# Patient Record
Sex: Female | Born: 1960 | Race: White | Hispanic: No | Marital: Married | Smoking: Former smoker
Health system: Southern US, Community
[De-identification: ages and names within clinical notes are randomized; demographics above are authoritative.]

## PROBLEM LIST (undated history)

## (undated) DIAGNOSIS — K5900 Constipation, unspecified: Secondary | ICD-10-CM

## (undated) DIAGNOSIS — T7840XA Allergy, unspecified, initial encounter: Secondary | ICD-10-CM

## (undated) DIAGNOSIS — M549 Dorsalgia, unspecified: Secondary | ICD-10-CM

## (undated) DIAGNOSIS — Z8616 Personal history of COVID-19: Secondary | ICD-10-CM

## (undated) DIAGNOSIS — L409 Psoriasis, unspecified: Secondary | ICD-10-CM

## (undated) DIAGNOSIS — D689 Coagulation defect, unspecified: Secondary | ICD-10-CM

## (undated) DIAGNOSIS — E785 Hyperlipidemia, unspecified: Secondary | ICD-10-CM

## (undated) DIAGNOSIS — E079 Disorder of thyroid, unspecified: Secondary | ICD-10-CM

## (undated) DIAGNOSIS — M1990 Unspecified osteoarthritis, unspecified site: Secondary | ICD-10-CM

## (undated) DIAGNOSIS — F32A Depression, unspecified: Secondary | ICD-10-CM

## (undated) DIAGNOSIS — I10 Essential (primary) hypertension: Secondary | ICD-10-CM

## (undated) DIAGNOSIS — N6099 Unspecified benign mammary dysplasia of unspecified breast: Secondary | ICD-10-CM

## (undated) DIAGNOSIS — K219 Gastro-esophageal reflux disease without esophagitis: Secondary | ICD-10-CM

## (undated) DIAGNOSIS — E119 Type 2 diabetes mellitus without complications: Secondary | ICD-10-CM

## (undated) DIAGNOSIS — F329 Major depressive disorder, single episode, unspecified: Secondary | ICD-10-CM

## (undated) DIAGNOSIS — G4700 Insomnia, unspecified: Secondary | ICD-10-CM

## (undated) DIAGNOSIS — I82401 Acute embolism and thrombosis of unspecified deep veins of right lower extremity: Secondary | ICD-10-CM

## (undated) HISTORY — DX: Gastro-esophageal reflux disease without esophagitis: K21.9

## (undated) HISTORY — DX: Psoriasis, unspecified: L40.9

## (undated) HISTORY — DX: Unspecified benign mammary dysplasia of unspecified breast: N60.99

## (undated) HISTORY — DX: Depression, unspecified: F32.A

## (undated) HISTORY — DX: Insomnia, unspecified: G47.00

## (undated) HISTORY — DX: Type 2 diabetes mellitus without complications: E11.9

## (undated) HISTORY — DX: Personal history of COVID-19: Z86.16

## (undated) HISTORY — DX: Disorder of thyroid, unspecified: E07.9

## (undated) HISTORY — DX: Major depressive disorder, single episode, unspecified: F32.9

## (undated) HISTORY — DX: Allergy, unspecified, initial encounter: T78.40XA

## (undated) HISTORY — DX: Coagulation defect, unspecified: D68.9

## (undated) HISTORY — DX: Essential (primary) hypertension: I10

## (undated) HISTORY — DX: Hyperlipidemia, unspecified: E78.5

## (undated) HISTORY — DX: Constipation, unspecified: K59.00

## (undated) HISTORY — DX: Unspecified osteoarthritis, unspecified site: M19.90

---

## 1995-08-21 HISTORY — PX: HAMMER TOE SURGERY: SHX385

## 1998-08-20 DIAGNOSIS — I82401 Acute embolism and thrombosis of unspecified deep veins of right lower extremity: Secondary | ICD-10-CM

## 1998-08-20 HISTORY — PX: KNEE ARTHROSCOPY: SHX127

## 1998-08-20 HISTORY — DX: Acute embolism and thrombosis of unspecified deep veins of right lower extremity: I82.401

## 1999-08-21 HISTORY — PX: CHOLECYSTECTOMY: SHX55

## 1999-08-21 HISTORY — PX: FRACTURE SURGERY: SHX138

## 2000-04-08 ENCOUNTER — Emergency Department (HOSPITAL_COMMUNITY): Admission: EM | Admit: 2000-04-08 | Discharge: 2000-04-08 | Payer: Self-pay | Admitting: Emergency Medicine

## 2000-04-08 ENCOUNTER — Encounter: Payer: Self-pay | Admitting: Emergency Medicine

## 2000-04-10 ENCOUNTER — Inpatient Hospital Stay (HOSPITAL_COMMUNITY): Admission: RE | Admit: 2000-04-10 | Discharge: 2000-04-13 | Payer: Self-pay | Admitting: Orthopedic Surgery

## 2000-04-10 ENCOUNTER — Encounter: Payer: Self-pay | Admitting: Orthopedic Surgery

## 2000-05-14 ENCOUNTER — Encounter: Payer: Self-pay | Admitting: Orthopedic Surgery

## 2000-05-14 ENCOUNTER — Encounter: Admission: RE | Admit: 2000-05-14 | Discharge: 2000-05-14 | Payer: Self-pay | Admitting: Orthopedic Surgery

## 2000-06-07 ENCOUNTER — Inpatient Hospital Stay (HOSPITAL_COMMUNITY): Admission: EM | Admit: 2000-06-07 | Discharge: 2000-06-18 | Payer: Self-pay | Admitting: Emergency Medicine

## 2000-06-14 ENCOUNTER — Encounter: Payer: Self-pay | Admitting: Orthopedic Surgery

## 2000-07-08 ENCOUNTER — Encounter: Admission: RE | Admit: 2000-07-08 | Discharge: 2000-09-19 | Payer: Self-pay | Admitting: Orthopedic Surgery

## 2000-08-27 ENCOUNTER — Ambulatory Visit (HOSPITAL_COMMUNITY): Admission: RE | Admit: 2000-08-27 | Discharge: 2000-08-27 | Payer: Self-pay | Admitting: Family Medicine

## 2000-11-18 ENCOUNTER — Other Ambulatory Visit: Admission: RE | Admit: 2000-11-18 | Discharge: 2000-11-18 | Payer: Self-pay | Admitting: Obstetrics and Gynecology

## 2000-11-18 ENCOUNTER — Encounter (INDEPENDENT_AMBULATORY_CARE_PROVIDER_SITE_OTHER): Payer: Self-pay | Admitting: Specialist

## 2001-03-06 ENCOUNTER — Observation Stay (HOSPITAL_COMMUNITY): Admission: RE | Admit: 2001-03-06 | Discharge: 2001-03-07 | Payer: Self-pay | Admitting: General Surgery

## 2001-03-06 ENCOUNTER — Encounter (INDEPENDENT_AMBULATORY_CARE_PROVIDER_SITE_OTHER): Payer: Self-pay | Admitting: *Deleted

## 2001-08-20 HISTORY — PX: HERNIA REPAIR: SHX51

## 2002-09-02 ENCOUNTER — Other Ambulatory Visit: Admission: RE | Admit: 2002-09-02 | Discharge: 2002-09-02 | Payer: Self-pay | Admitting: Obstetrics and Gynecology

## 2003-06-17 LAB — HM PAP SMEAR: HM Pap smear: NORMAL

## 2003-10-29 ENCOUNTER — Other Ambulatory Visit: Admission: RE | Admit: 2003-10-29 | Discharge: 2003-10-29 | Payer: Self-pay | Admitting: Obstetrics and Gynecology

## 2004-08-20 HISTORY — PX: COLONOSCOPY: SHX174

## 2004-09-16 LAB — HM COLONOSCOPY: HM Colonoscopy: NORMAL

## 2004-12-13 ENCOUNTER — Other Ambulatory Visit: Admission: RE | Admit: 2004-12-13 | Discharge: 2004-12-13 | Payer: Self-pay | Admitting: Obstetrics and Gynecology

## 2005-05-02 ENCOUNTER — Encounter: Admission: RE | Admit: 2005-05-02 | Discharge: 2005-07-31 | Payer: Self-pay | Admitting: Internal Medicine

## 2005-05-25 ENCOUNTER — Emergency Department (HOSPITAL_COMMUNITY): Admission: EM | Admit: 2005-05-25 | Discharge: 2005-05-25 | Payer: Self-pay | Admitting: Emergency Medicine

## 2005-06-20 ENCOUNTER — Ambulatory Visit: Payer: Self-pay | Admitting: Internal Medicine

## 2005-06-21 ENCOUNTER — Ambulatory Visit: Payer: Self-pay | Admitting: Internal Medicine

## 2005-06-21 ENCOUNTER — Encounter (INDEPENDENT_AMBULATORY_CARE_PROVIDER_SITE_OTHER): Payer: Self-pay | Admitting: Specialist

## 2005-08-08 ENCOUNTER — Ambulatory Visit: Payer: Self-pay | Admitting: Internal Medicine

## 2005-08-15 ENCOUNTER — Ambulatory Visit: Payer: Self-pay | Admitting: Internal Medicine

## 2006-03-20 ENCOUNTER — Other Ambulatory Visit: Admission: RE | Admit: 2006-03-20 | Discharge: 2006-03-20 | Payer: Self-pay | Admitting: Obstetrics and Gynecology

## 2006-06-20 ENCOUNTER — Ambulatory Visit (HOSPITAL_COMMUNITY): Admission: RE | Admit: 2006-06-20 | Discharge: 2006-06-20 | Payer: Self-pay | Admitting: Internal Medicine

## 2006-12-19 ENCOUNTER — Ambulatory Visit (HOSPITAL_COMMUNITY): Admission: RE | Admit: 2006-12-19 | Discharge: 2006-12-19 | Payer: Self-pay | Admitting: Internal Medicine

## 2008-02-04 ENCOUNTER — Ambulatory Visit: Payer: Self-pay | Admitting: Vascular Surgery

## 2009-05-18 ENCOUNTER — Encounter: Admission: RE | Admit: 2009-05-18 | Discharge: 2009-05-18 | Payer: Self-pay | Admitting: Internal Medicine

## 2009-06-01 ENCOUNTER — Ambulatory Visit (HOSPITAL_COMMUNITY): Admission: RE | Admit: 2009-06-01 | Discharge: 2009-06-01 | Payer: Self-pay | Admitting: Internal Medicine

## 2010-06-14 ENCOUNTER — Ambulatory Visit (HOSPITAL_COMMUNITY): Admission: RE | Admit: 2010-06-14 | Discharge: 2010-06-14 | Payer: Self-pay | Admitting: Orthopaedic Surgery

## 2011-01-02 NOTE — Procedures (Signed)
LOWER EXTREMITY VENOUS REFLUX EXAM   INDICATION:  Right leg pain and swelling.   EXAM:  Using color-flow imaging and pulse Doppler spectral analysis, the  right common femoral, superficial femoral, popliteal, posterior tibial,  greater and lesser saphenous veins are evaluated.  There is no evidence  suggesting deep venous insufficiency in the right lower extremity.   The right saphenofemoral junction is competent.  The right GSV is  competent with the caliber as described below.   The right proximal short saphenous vein demonstrates competency.   GSV Diameter (used if found to be incompetent only)                                            Right    Left  Proximal Greater Saphenous Vein           cm       cm  Proximal-to-mid-thigh                     cm       cm  Mid thigh                                 cm       cm  Mid-distal thigh                          cm       cm  Distal thigh                              cm       cm  Knee                                      cm       cm    IMPRESSION:  1. Right greater saphenous vein had no reflux.  2. The right greater saphenous vein is not aneurysmal.  3. The right greater saphenous vein is not tortuous.  4. The deep venous system is competent.  5. The right lesser saphenous vein is competent.  6. There is a perforator noted distal to the calf with a measurement      of 0.63 cm.  7. No evidence of deep venous thrombosis noted in the right leg.     ___________________________________________  Larina Earthly, M.D.   MG/MEDQ  D:  02/04/2008  T:  02/04/2008  Job:  [id number]

## 2011-01-02 NOTE — Letter (Signed)
February 04, 2008   Lovenia Kim, D.O.  [address]  [place]  [place] [address]   Re:  EMBER, HENRIKSON                  DOB:  26-Jun-1961   Dear Amy:   Thank you for asking me to see your patient for evaluation of her right  leg venous pathology.  As you know, she is an otherwise healthy 50-year-  old white female with discomfort and progressive bulging in her right  medial calf above her medial malleolus.  She does have a history of deep  venous thrombosis in 2001 in her right popliteal vein at the time of  knee arthroscopic surgery.  She was on Coumadin for several months  treatment and had good resolution.  She has had no post phlebitic  syndrome.  She reports that she does have some aching over this area  that protrudes at the end of a long day.  She has not worn any graduated  compression garments.   PAST MEDICAL HISTORY:  Otherwise unremarkable.  She does have history of  hypothyroidism and is on Synthroid replacement.  She denies any cardiac  history and denies any elevated cholesterol or hypertension or diabetes.   SOCIAL HISTORY:  She is single.  She does smoke one pack of cigarettes  per day.  Does not drink alcohol on a regular basis.   REVIEW OF SYSTEMS:  Her weight is reportedly 210 pounds.  She is 5 feet  5 inches tall.  Otherwise her cardiac, pulmonary, GI, GU review of  systems is negative.   PHYSICAL EXAMINATION:  General:  She is a well-developed, well-nourished  white female appearing her stated age of 28.  Vital signs:  Blood  pressure is 133/90, pulse 75, respirations 18.  She has 2+ dorsalis  pedis pulses bilaterally.  She does have prominent varicosity at the  level of her medial distal right calf.  She does have some scattered  spider vein telangiectasia bilaterally but otherwise no evidence of  venous hypertension.   She underwent a right leg venous duplex in our office.  Fortunately this  shows that she has had complete resolution of her  slight deep venous  thrombosis with no evidence of occlusion or reflux in her deep system.  Her right great saphenous vein is somewhat large in caliber but she does  not have any reflux in her superficial veins as well.  She does have a  perforator connecting her deep and superficial system with reflux over  the area of the bulge in her medial calf.   I discussed this at length with the patient.  I explained that this does  not preclude her to any more serious complications and I am quite  pleased at her resolution of her prior DVT from 2001.  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  I explained  this could be treated in our office with either sclerotherapy or laser  ablation of the perforator.  I explained that our choice would be laser  ablation for better longterm durability.  She understands the discussion  and is currently comfortable with her level of discomfort and is  comfortable with observation only.  I did explain the importance of  elevation when possible.  Also she understands the potential treatment  of graduated compression garments as well.  She will notify me should  she wish to proceed with further treatment.  Once again, thank you for allowing me to participate in the care of this  patient with you.   Sincerely,   Larina Earthly, M.D.  Electronically Signed   TFE/MEDQ  D:  02/04/2008  T:  02/05/2008  Job:  [id number]

## 2011-01-05 NOTE — Discharge Summary (Signed)
[facility]  Patient:    Christine Norman, Christine Norman                          MRN: [id number] Adm. Date:  [id number] Disc. Date: [id number] Attending:  Loanne Drilling Dictator:   Ralene Bathe, P.A.C.                           Discharge Summary  ADMISSION DIAGNOSES: 1. Displaced fibular fracture on the right. 2. History of deep venous thrombosis.  DISCHARGE DIAGNOSES: 1. Displaced fibular fracture on the right. 2. History of deep venous thrombosis. 3. Status post open reduction and internal fixation right fibula.  OPERATION:  ORIF right fibula.  SURGEON:  Trudee Grip, M.D.  ASSISTANT:  Della Goo, P.A.  ANESTHESIA:  General anesthesia.  BRIEF HISTORY:  This 50 year old female slipped and fell sustaining a right ankle injury.  X-rays demonstrated displaced distal fibular fracture. Displacement was noted and operative fixation was indicated.  Risks and benefits were discussed with the patient and she was in agreement with the procedure.  HOSPITAL COURSE:  The patient was admitted and underwent the above noted procedure and tolerated this well.  All appropriate IV antibiotics and analgesics were provided.  Postoperatively she was placed in a splint, nonweightbearing to the operative extremity.  The patient had a previous history of DVT, therefore Coumadin and Lovenox were used postoperatively.  She was kept hospitalized until therapeutic on her Coumadin.  The patient was weaned off of IV antibiotics and physical therapy worked with the patient. All home needs were arranged.  On April 13, 2000, she was on p.o. analgesics. She was therapeutic on her Coumadin with protime of 19.2 and INR of 2.1.  She was neurovascularly intact to the operative extremity and she was stable for discharge to home.  LABORATORY VALUES:  Admission hemoglobin normal at 11.4.  Postoperative protimes all followed by pharmacy and she was therapeutic with protime of 19.2 and  INR 2.1 prior to discharge.  Routine chemistries on admission were within normal limits.  Routine urinalysis was normal.  X-rays showed postoperative status post ORIF distal fibula fracture on the right in anatomic alignment.  Chest x-ray showed cardiac silhouette upper limits of normal.  EKG on admission showed normal sinus rhythm.  CONDITION ON DISCHARGE:  Stable ___________________.  DISPOSITION:  The patient discharged to home in the care of family. Wheelchair and walker for home needs.  Continue nonweightbearing.  Continue ice and elevation.  Protimes will be monitored per pharmacy as well as Coumadin protocol.  DISCHARGE MEDICATIONS: 1. Percocet 7.5 mg #40 one to two every four to six hours p.r.n. pain. 2. Robaxin 500 mg #30 one every six hours p.r.n. spasm. 3. Celebrex, which she has at home.  DISCHARGE INSTRUCTIONS:  Keep splint dry.  FOLLOW-UP:  She will follow up in our office on April 19, 2000, and is to call for a time. DD:  05/20/00 TD:  05/20/00 Job: [id number]

## 2011-01-05 NOTE — Discharge Summary (Signed)
[facility]  Patient:    Christine Norman, Christine Norman                          MRN: [id number] Adm. Date:  [id number] Disc. Date: [id number] Attending:  Loanne Drilling Dictator:   Della Goo, P.A.                           Discharge Summary  ADMITTING DIAGNOSES: 1. Deep venous thrombosis, right lower extremity. 2. Status post right knee arthroscopy June 04, 2000. 3. Status post open reduction internal fixation right ankle April 10, 2000. 4. History of previous deep venous thrombosis following foot surgery    approximately 3 years prior to this admission with use of chronic Coumadin    since that time.  DISCHARGE DIAGNOSES: 1. Deep venous thrombosis, right lower extremity. 2. Status post right knee arthroscopy June 04, 2000. 3. Status post open reduction internal fixation right ankle April 10, 2000. 4. History of previous deep venous thrombosis following foot surgery    approximately 3 years prior to this admission with use of chronic Coumadin    since that time. 5. Mild iron deficiency anemia.  PROCEDURE:  None.  CONSULTATIONS:  Dr. Jamie Brookes.  BRIEF HISTORY:  The patient is a 50 year old female who developed severe unrelenting pain in her right calf shortly following a right knee arthroscopy. On examination, the calf was noted to be tender with a positive Homans. She was sent to [facility] where Dopplers revealed deep venous thrombosis of the right lower extremity. She was admitted to the hospital for treatment of the thrombus.  BRIEF HOSPITAL COURSE:  Upon admission, the patient was seen by pharmacy for anticoagulation therapy. She was placed on Lovenox subcu and continued on her Coumadin. Daily pro-times were drawn and she did reach therapeutic level. She continued to have significant pain over several days and required oral analgesics around the clock. She was at bedrest with the leg elevated and eventually was able to begin  some ambulation with crutches when her pain began to resolve. A repeat Doppler on October 23 showed a continuation of the clot. The patient was seen by Dr. Jamie Brookes during the hospital stay. She did have mild iron deficiency anemia and was started on iron supplementation. Also during the hospital stay, she worked on smoking cessation. Repeat Doppler on October 29 showed diminishing size of the right DVT. She was therapeutic with her anticoagulation therapy and arrangements were made for her to have follow-up Coumadin management at [facility] with her usual family medical doctor.  CONDITION ON DISCHARGE:  Stable.  PERTINENT LABORATORY VALUES:  Admission CBC showed wbc 13.6, hemoglobin 11.6, hematocrit 34.2. Coagulation studies on admission showed protime 15.7, INR 1.4. She continued to show signs of elevation in PT and INR and did reach therapeutic range while on Coumadin and Lovenox. Factor 5 mutation and prothrombin gene mutation were pending at the time of discharge. Her ferritin level was noted to be 54.  X-rays taken during the hospital stay include two views of the right ankle on June 14, 2000 showing a comminuted fibular fracture status post ORIF with hardware intact. No EKG was necessary during the hospital stay. Doppler studies as stated above.  PLAN:  The patient is being discharged to her home. She will continue to ambulate as tolerated and have range of motion of the right knee as tolerated. Her wounds  from the previous knee arthroscopy are dry and clean and there are no specific treatments for these. Stitches were removed during the hospital stay. She will have the right lower extremity elevated when not ambulatory.  DISCHARGE MEDICATIONS: 1. Percocet #30, 1-2 every 4-6h as needed for pain. 2. Robaxin #20, 1 every 8h as needed for spasm. 3. She will be sent home with a prescription for Coumadin and will have    follow-up with [facility]  regarding her Coumadin management.  FOLLOW-UP:  With Dr. Lequita Halt in 1 week and has been advised to call the office to arrange the appointment. If there are further questions or concerns prior to her return office visit, she has been advised to call the office. DD:  06/18/00 TD:  06/18/00 Job: [id number]

## 2011-01-05 NOTE — Op Note (Signed)
[facility]  Patient:    Christine Norman, Christine Norman                          MRN: [id number] Proc. Date: 03/06/01 Adm. Date:  [id number] Attending:  Carson Myrtle CC:         Dario Guardian, M.D.   Operative Report  PREOPERATIVE DIAGNOSIS:  Cholecystolithiasis.  POSTOPERATIVE DIAGNOSIS:  Cholecystolithiasis.  OPERATION:  Laparoscopic cholecystectomy.  SURGEON:  Timothy E. Earlene Plater, M.D.  ASSISTANT:  Anselm Pancoast. Zachery Dakins, M.D.  ANESTHESIA:  CRNA supervised by Dr. Shireen Quan.  INDICATIONS:  Ms. [name] is otherwise healthy, 51, obese, on a weight-losing diet, history of DVTs, following orthopedic surgery, who has developed in the past few months, following a diet, acute episodes of cholecystolithiasis. Laboratory data are normal.  Ultrasound confirms small stones.  The patient is ready to proceed.  She is given preoperative antibiotics, preoperative heparin subcu, and PAS hose.  DESCRIPTION OF PROCEDURE:  She was taken to the operating room and placed supine.  General endotracheal anesthesia administered.  The abdomen was scrubbed, prepped and draped in the usual fashion.  An infraumbilical incision made, the fascia identified and opened vertically, and the peritoneum entered without complication.  A #1 Vicryl suture placed, Hasson catheter through the fascia into the peritoneum and tied in place.  The abdomen was insufflated, laparoscope inserted, no other findings made.  Gallbladder thickened, minimal adhesions.  A 10 mm trocar was placed in the mid epigastrium, two 5 mm trocars in the right upper quadrant.  The gallbladder was grasped and placed on tension.  Careful dissection at the base of the gallbladder revealed a single cystic duct entering the gallbladder.  The triangle of Calot was completely dissected down including an artery which was posterior to the cystic duct. These structures well demonstrated. The cystic duct was triply clipped and divided.   The artery was further dissected, triply clipped, and divided. Further dissection with the cautery released the gallbladder from the gallbladder bed.  A small posterior tributary artery was clipped once.  The gallbladder was removed from the gallbladder bed without incident or complication.  There was no bleeding, no bile leak.  The gallbladder was removed through the gallbladder through the infraumbilical incision which was then tied closed with the #1 Vicryl.  Copious irrigation was carried out. inspection was negative.  With this, the irrigant, CO2, instruments, and trocars all removed from the abdominal cavity.  Each site had been injected with 0.5% Marcaine with epinephrine prior to incision.  Each of these was now closed with the 3-0 Monocryl.  Steri-Strips applied.  Dry sterile dressings applied.  Second counts correct.  She tolerated it well and was removed to the recovery room in good condition. DD:  03/06/01 TD:  03/06/01 Job: [id number]

## 2011-01-05 NOTE — Op Note (Signed)
[facility]  Patient:    Christine Norman, Christine Norman                          MRN: [id number] Proc. Date: 04/10/00 Adm. Date:  [id number] Attending:  Ollen Gross V                           Operative Report  PREOPERATIVE DIAGNOSIS:  Right fibular fracture.  POSTOPERATIVE DIAGNOSIS:  Right fibular fracture.  PROCEDURE:  Open reduction, internal fixation, right fibular fracture.  SURGEON:  Trudee Grip, M.D.  ASSISTANT:  Della Goo, P.A.  ANESTHESIA:   General.  ESTIMATED BLOOD LOSS:  Minimal.  DRAINS:  None.  TOURNIQUET TIME:  20 minutes at 350 mmHg.  COMPLICATIONS:  None.  CONDITION:  Stable to the recovery room.  INDICATIONS:  Christine Norman is a 50 year old female who had a fall two nights ago, sustaining a displaced right fibular fracture.  It is about 8.0 cm above the joint, but is somewhat rotated and not in a position amenable to healing.  She is hurting all the way down through the ankle joint and also has some medial soreness.  She presents now for an open reduction and internal fixation.  DESCRIPTION OF PROCEDURE:  After the successful administration of general anesthetic, a tourniquet was placed high on the right thigh, and the right lower extremity prepped and draped in the usual sterile fashion.  The extremity is wrapped in an Esmarch and the tourniquet is inflated to 350 mmHg. The fracture is palpated through the skin and an incision made about 8.0 cm in length with the center of the incision centered over the fracture.  The skin is cut with a #10 blade.  The subcutaneous tissue is carefully dissected, to avoid the superficial peroneal nerve.  Muscle is identified just posterior to the fibula and then the fracture was identified.  The periosteum is elevated at the fracture level.  The fracture is anatomically reduced, but there is a small butterfly posteriorly that is left attached to its muscular attachments. Once the fracture is  anatomically reduced, then a six-hole, one-third tubular plate is placed in the center, plated at the apex of the fracture.  Three cortical screws are placed proximal, three distal to the fracture line.  This lead to an anatomic reduction, confirmed on multiple fluoroscopic views.  At this point the wound is copiously irrigated and the deep tissues closed over the plate.  The tourniquet is then released with a total time of 20 minutes. The subcutaneous is closed with interrupted #2-0 Vicryl, the subcuticular with running #3-0 Monocryl.  The incision is clean and dry, and Steri-Stripped.  A bulky sterile dressing is applied.  She is placed in a posterior splint, awakened, and transported to the recovery room in stable condition. DD:  04/10/00 TD:  04/11/00 Job: [id number]

## 2011-01-05 NOTE — H&P (Signed)
[facility]  Patient:    Christine Norman, Christine Norman                          MRN: [id number] Adm. Date:  [id number] Attending:  Loanne Drilling Dictator:   Alexzandrew L. Julien Girt, P.A.-C. CC:         Dario Guardian, M.D.   History and Physical  CHIEF COMPLAINT:  Right calf pain.  HISTORY OF PRESENT ILLNESS:  Patient is a 50 year old female who has developed calf pain.  She recently underwent right knee arthroscopy back on June 04, 2000.  She was brought into the clinic on the date of admission and found to have increasing right calf pain over the past couple of days. The calf was noted to be tender and sore and swollen; it was felt that she could possibly have a deep venous thrombosis.  She was sent over to [facility] where Dopplers were performed.  They were found to be positive; therefore, she was sent to the hospital for admission and treatment.  Patient was seen and admitted to [facility].  ALLERGIES:  No known drug allergies.  CURRENT MEDICATIONS 1. Celexa 20 mg one p.o. q.d. 2. Oxycodone one or two every four to six hours as needed for pain. 3. Robaxin 500 mg one p.o. q.6-8h. p.r.n. spasm. 4. Coumadin 5 mg -- she has taken this daily since her prior ankle surgery.  PAST MEDICAL HISTORY:  History of deep venous thrombosis approximately three to four years ago.  PAST SURGICAL HISTORY:  ORIF, right ankle, on April 10, 2000 at [facility], and recent right knee arthroscopy on June 04, 2000.  SOCIAL HISTORY:  She is married.  No children.  One-half-to-one-pack-per-day smoking history.  No alcohol.  She is unemployed.  FAMILY HISTORY:  Mother living, age 43, with diabetes.  Father living, age 27, with heart disease and also pacemaker placement.  REVIEW OF SYSTEMS:  GENERAL:  Patient has had some intermittent chills here over the past couple of days.  No fevers or sweats.  NEUROLOGIC:  She has had some  intermittent dizziness; however, no loss of consciousness or syncopal episode.  No blurred vision or double vision.  RESPIRATORY:  No shortness of breath, productive cough or hemoptysis.  CARDIOVASCULAR:  No chest pain, angina or orthopnea.  GI:  Patient has had some constipation since her surgery.  No nausea, vomiting or diarrhea.  No blood or mucus in the stool. GU:  No dysuria, hematuria or discharge.  MUSCULOSKELETAL:  Pertinent to that of the right leg found in history of present illness.  PHYSICAL EXAMINATION  VITAL SIGNS:  Temperature 97.5, pulse 83, respirations 20, blood pressure 117/68.  GENERAL:  Patient is a 50 year old white female, well-nourished, well alert, and appears to be in no acute distress; however, she is in some mild distress secondary to the right leg pain.  She appears to be slightly overweight.  She appears to be an average historian and appears her stated age.  HEENT:  Normocephalic, atraumatic.  Pupils are round and reactive.  Oropharynx is clear.  NECK:  Supple.  CHEST:  Clear to auscultation and percussion.  No rhonchi or rales.  HEART:  Regular rate and rhythm.  No murmurs.  S1 and S2 noted.  ABDOMEN:  Soft, round abdomen.  Bowel sounds are faint but present. Nontender.  RECTAL:  Not done, not pertinent to present illness.  BREASTS:  Not done,  not pertinent to present illness.  GENITALIA:  Not done, not pertinent to present illness.  EXTREMITIES:  Significant to that of the right lower extremity.  Portal sites are noted to be in good condition.  Sutures are intact.  The right leg is somewhat tender in the proximal calf region.  No palpable cords.  Slight erythema.  Positive Homans.  IMPRESSION 1. Deep venous thrombosis, right lower extremity. 2. Status post recent knee arthroscopy, June 04, 2000. 3. Status post recent ORIF, right ankle, April 10, 2000. 4. Prior history of deep venous thrombosis three to four years ago.  PLAN:  Patient  will be admitted to [facility], placed at bedrest and start to initiate treatment on deep venous thrombosis. DD:  06/13/00 TD:  06/13/00 Job: [id number]

## 2011-09-04 ENCOUNTER — Other Ambulatory Visit: Payer: Self-pay | Admitting: Internal Medicine

## 2011-09-04 DIAGNOSIS — E041 Nontoxic single thyroid nodule: Secondary | ICD-10-CM

## 2011-09-07 ENCOUNTER — Ambulatory Visit
Admission: RE | Admit: 2011-09-07 | Discharge: 2011-09-07 | Disposition: A | Discharge status: Home / Self Care | Payer: 59 | Source: Ambulatory Visit | Attending: Internal Medicine | Admitting: Internal Medicine

## 2011-09-07 DIAGNOSIS — E041 Nontoxic single thyroid nodule: Secondary | ICD-10-CM

## 2011-12-27 ENCOUNTER — Other Ambulatory Visit: Payer: Self-pay

## 2012-01-18 ENCOUNTER — Ambulatory Visit (INDEPENDENT_AMBULATORY_CARE_PROVIDER_SITE_OTHER): Payer: 59 | Admitting: Surgery

## 2012-01-18 ENCOUNTER — Encounter (INDEPENDENT_AMBULATORY_CARE_PROVIDER_SITE_OTHER): Payer: Self-pay | Admitting: Surgery

## 2012-01-18 VITALS — BP 130/86 | HR 76 | Temp 98.2°F | Resp 16 | Ht 64.0 in | Wt 202.0 lb

## 2012-01-18 DIAGNOSIS — N6099 Unspecified benign mammary dysplasia of unspecified breast: Secondary | ICD-10-CM | POA: Insufficient documentation

## 2012-01-18 DIAGNOSIS — N6089 Other benign mammary dysplasias of unspecified breast: Secondary | ICD-10-CM

## 2012-01-18 HISTORY — DX: Unspecified benign mammary dysplasia of unspecified breast: N60.99

## 2012-01-18 NOTE — Patient Instructions (Signed)
We  Will schedule surgery to remove the area of abnormality in the left breast found on the recent biopsy

## 2012-01-18 NOTE — Progress Notes (Signed)
NAME: Christine Norman DOB: 09-Mar-1961 MRN: [id number]                                                                                      DATE: 01/18/2012  PCP: Maurine Simmering, PA-C Referring Provider: Althea Charon*  IMPRESSION:  Atypical hyperplasia left breast  PLAN:   Wire localized biopsy of left breast. I have discussed the indications for the lumpectomy and described the procedure. She understand that the chance of removal of the abnormal area is very good, but that occasionally we are unable to locate it and may have to do a second procedure. We also discussed the possibility of a second procedure to get additional tissue. Risks of surgery such as bleeding and infection have also been explained, as well as the implications of not doing the surgery. She understands and wishes to proceed.                  CC:  Chief Complaint  Patient presents with  . Breast Problem    new pt- eval L breast mass    HPI:  Christine Norman is a 51 y.o.  female who presents for evaluation of Atypical hyperplasia found on needle core biopsy of the left breast. The patient had routine mammograms done and a small mass was seen in the left breast. Biopsy was done showing some sclerosing adenosis but also some atypical ductal hyperplasia. Excisional biopsy was recommended. The patient is having no breast symptoms, has had no prior breast problems, and has a negative family history for breast cancer. She comes to the office today for evaluation and possible surgical excision of this area.  PMH:  has a past medical history of Clotting disorder; Hyperlipidemia; Thyroid disease; and Arthritis.  PSH:   has past surgical history that includes Knee arthroscopy (2000) and Cholecystectomy (2001).  ALLERGIES:   Allergies  Allergen Reactions  . Celebrex (Celecoxib) Hives    MEDICATIONS: Current outpatient prescriptions:buPROPion (WELLBUTRIN XL) 150 MG 24 hr tablet, daily., Disp: , Rfl: ;  cholecalciferol  (VITAMIN D) 1000 UNITS tablet, Take 1,000 Units by mouth daily., Disp: , Rfl: ;  diclofenac (VOLTAREN) 75 MG EC tablet, BID times 48H., Disp: , Rfl: ;  levothyroxine (SYNTHROID, LEVOTHROID) 50 MCG tablet, Take 75 mcg by mouth daily., Disp: , Rfl: ;  rosuvastatin (CRESTOR) 10 MG tablet, Take 10 mg by mouth daily., Disp: , Rfl:  traMADol (ULTRAM) 50 MG tablet, Take 50 mg by mouth every 6 (six) hours as needed., Disp: , Rfl:   ROS: She has filled out our 12 point review of systems and it is negative.  EXAM:   GENERAL:  The patient is alert, oriented, and generally healthy-appearing, NAD. Mood and affect are normal.  HEENT:  The head is normocephalic, the eyes nonicteric, the pupils were round regular and equal. EOMs are normal. Pharynx normal. Dentition good.  NECK:  The neck is supple and there are no masses or thyromegaly.  LUNGS: Normal respirations and clear to auscultation.  HEART: Regular rhythm, with no murmurs rubs or gallops. Pulses are intact carotid dorsalis pedis and posterior tibial. No significant varicosities are noted.  BREASTS:  No mass,no abnormality.  LYMPHATICS: No axillary or supraclavicular adenopathy  ABDOMEN: Soft, flat, and nontender. No masses or organomegaly is noted. No hernias are noted. Bowel sounds are normal.  EXTREMITIES:  Good range of motion, no edema.Mild varicositied   DATA REVIEWED:  Mammogram films and reports, path report.    Emon Lance J 01/18/2012  CC: Colonel Bald, PA-C, PA-C

## 2012-01-23 ENCOUNTER — Encounter (HOSPITAL_BASED_OUTPATIENT_CLINIC_OR_DEPARTMENT_OTHER): Payer: Self-pay | Admitting: *Deleted

## 2012-01-23 NOTE — Progress Notes (Signed)
No labs needed

## 2012-01-24 ENCOUNTER — Encounter (INDEPENDENT_AMBULATORY_CARE_PROVIDER_SITE_OTHER): Payer: Self-pay

## 2012-01-28 ENCOUNTER — Telehealth (INDEPENDENT_AMBULATORY_CARE_PROVIDER_SITE_OTHER): Payer: Self-pay

## 2012-01-28 NOTE — Telephone Encounter (Signed)
Pt calling to let Dr. Jamey Ripa know she was seen for a sore throat and low grade fever by her PCP.  She was negative for strep, but was given Rx for Z-pak which she has begun taking.  Her surgery is 1:00pm tomorrow.  I told her Dr. Jamey Ripa or his nurse would contact her if needed.

## 2012-01-28 NOTE — Telephone Encounter (Signed)
Spoke with Ms. Christine Norman this afternoon.  I explained that Dr. Jamey Ripa was still unavailable, but he will get her message.  She is to be at [facility] tomorrow as scheduled.  She can let the nurses there know she is taking Z-pak for her sore throat, and if for any reason she spikes a fever she is to call our office in the morning.  She understood and agreed.

## 2012-01-29 ENCOUNTER — Encounter (HOSPITAL_BASED_OUTPATIENT_CLINIC_OR_DEPARTMENT_OTHER): Payer: Self-pay | Admitting: Surgery

## 2012-01-29 ENCOUNTER — Encounter (HOSPITAL_BASED_OUTPATIENT_CLINIC_OR_DEPARTMENT_OTHER)
Admission: RE | Disposition: A | Discharge status: Home / Self Care | Payer: Self-pay | Source: Ambulatory Visit | Attending: Surgery

## 2012-01-29 ENCOUNTER — Encounter (HOSPITAL_BASED_OUTPATIENT_CLINIC_OR_DEPARTMENT_OTHER): Payer: Self-pay | Admitting: Anesthesiology

## 2012-01-29 ENCOUNTER — Ambulatory Visit (HOSPITAL_BASED_OUTPATIENT_CLINIC_OR_DEPARTMENT_OTHER)
Admission: RE | Admit: 2012-01-29 | Discharge: 2012-01-29 | Disposition: A | Discharge status: Home / Self Care | Payer: 59 | Source: Ambulatory Visit | Attending: Surgery | Admitting: Surgery

## 2012-01-29 ENCOUNTER — Ambulatory Visit (HOSPITAL_BASED_OUTPATIENT_CLINIC_OR_DEPARTMENT_OTHER): Payer: 59 | Admitting: Anesthesiology

## 2012-01-29 DIAGNOSIS — N6039 Fibrosclerosis of unspecified breast: Secondary | ICD-10-CM

## 2012-01-29 DIAGNOSIS — N6029 Fibroadenosis of unspecified breast: Secondary | ICD-10-CM | POA: Insufficient documentation

## 2012-01-29 DIAGNOSIS — N6099 Unspecified benign mammary dysplasia of unspecified breast: Secondary | ICD-10-CM

## 2012-01-29 HISTORY — PX: BREAST BIOPSY: SHX20

## 2012-01-29 HISTORY — DX: Dorsalgia, unspecified: M54.9

## 2012-01-29 HISTORY — DX: Acute embolism and thrombosis of unspecified deep veins of right lower extremity: I82.401

## 2012-01-29 SURGERY — BREAST BIOPSY WITH NEEDLE LOCALIZATION
Anesthesia: General | Site: Breast | Laterality: Left | Wound class: Clean

## 2012-01-29 MED ORDER — HYDROCODONE-ACETAMINOPHEN 5-325 MG PO TABS: 1.0000 | ORAL_TABLET | ORAL | Status: AC | PRN

## 2012-01-29 MED ORDER — METOCLOPRAMIDE HCL 5 MG/ML IJ SOLN: 10.0000 mg | Freq: Once | INTRAMUSCULAR | Status: DC | PRN

## 2012-01-29 MED ORDER — ACETAMINOPHEN 10 MG/ML IV SOLN
1000.0000 mg | Freq: Once | INTRAVENOUS | Status: AC
  Administered 2012-01-29: 1000 mg via INTRAVENOUS

## 2012-01-29 MED ORDER — CHLORHEXIDINE GLUCONATE 4 % EX LIQD: 1.0000 "application " | Freq: Once | CUTANEOUS | Status: DC

## 2012-01-29 MED ORDER — DEXAMETHASONE SODIUM PHOSPHATE 4 MG/ML IJ SOLN
INTRAMUSCULAR | Status: DC | PRN
  Administered 2012-01-29: 10 mg via INTRAVENOUS

## 2012-01-29 MED ORDER — LACTATED RINGERS IV SOLN
INTRAVENOUS | Status: DC
  Administered 2012-01-29 (×2): via INTRAVENOUS

## 2012-01-29 MED ORDER — HYDROMORPHONE HCL PF 1 MG/ML IJ SOLN
0.2500 mg | INTRAMUSCULAR | Status: DC | PRN
  Administered 2012-01-29: 0.5 mg via INTRAVENOUS

## 2012-01-29 MED ORDER — 0.9 % SODIUM CHLORIDE (POUR BTL) OPTIME
TOPICAL | Status: DC | PRN
  Administered 2012-01-29: 200 mL

## 2012-01-29 MED ORDER — MIDAZOLAM HCL 5 MG/5ML IJ SOLN
INTRAMUSCULAR | Status: DC | PRN
  Administered 2012-01-29: 2 mg via INTRAVENOUS

## 2012-01-29 MED ORDER — PROPOFOL 10 MG/ML IV EMUL
INTRAVENOUS | Status: DC | PRN
  Administered 2012-01-29: 270 mg via INTRAVENOUS

## 2012-01-29 MED ORDER — CEFAZOLIN SODIUM-DEXTROSE 2-3 GM-% IV SOLR
2.0000 g | INTRAVENOUS | Status: AC
  Administered 2012-01-29: 2 g via INTRAVENOUS

## 2012-01-29 MED ORDER — BUPIVACAINE HCL (PF) 0.25 % IJ SOLN
INTRAMUSCULAR | Status: DC | PRN
  Administered 2012-01-29: 20 mL

## 2012-01-29 MED ORDER — DROPERIDOL 2.5 MG/ML IJ SOLN
INTRAMUSCULAR | Status: DC | PRN
  Administered 2012-01-29: 0.625 mg via INTRAVENOUS

## 2012-01-29 MED ORDER — FENTANYL CITRATE 0.05 MG/ML IJ SOLN
INTRAMUSCULAR | Status: DC | PRN
  Administered 2012-01-29 (×2): 50 ug via INTRAVENOUS

## 2012-01-29 MED ORDER — OXYCODONE HCL 5 MG PO TABS: 5.0000 mg | ORAL_TABLET | Freq: Once | ORAL | Status: DC | PRN

## 2012-01-29 MED ORDER — HYDROCODONE-ACETAMINOPHEN 5-325 MG PO TABS
1.0000 | ORAL_TABLET | ORAL | Status: DC | PRN
  Administered 2012-01-29: 1 via ORAL

## 2012-01-29 MED ORDER — ONDANSETRON HCL 4 MG/2ML IJ SOLN
INTRAMUSCULAR | Status: DC | PRN
  Administered 2012-01-29: 4 mg via INTRAVENOUS

## 2012-01-29 MED ORDER — LIDOCAINE HCL (CARDIAC) 20 MG/ML IV SOLN
INTRAVENOUS | Status: DC | PRN
  Administered 2012-01-29: 60 mg via INTRAVENOUS

## 2012-01-29 SURGICAL SUPPLY — 48 items
APPLICATOR COTTON TIP 6IN STRL (MISCELLANEOUS) IMPLANT
BINDER BREAST LRG (GAUZE/BANDAGES/DRESSINGS) IMPLANT
BINDER BREAST MEDIUM (GAUZE/BANDAGES/DRESSINGS) IMPLANT
BINDER BREAST XLRG (GAUZE/BANDAGES/DRESSINGS) ×2 IMPLANT
BINDER BREAST XXLRG (GAUZE/BANDAGES/DRESSINGS) IMPLANT
BLADE HEX COATED 2.75 (ELECTRODE) ×2 IMPLANT
BLADE SURG 15 STRL LF DISP TIS (BLADE) ×1 IMPLANT
BLADE SURG 15 STRL SS (BLADE) ×1
CANISTER SUCTION 1200CC (MISCELLANEOUS) ×2 IMPLANT
CHLORAPREP W/TINT 26ML (MISCELLANEOUS) ×2 IMPLANT
CLIP TI MEDIUM 6 (CLIP) IMPLANT
CLIP TI WIDE RED SMALL 6 (CLIP) IMPLANT
CLOTH BEACON ORANGE TIMEOUT ST (SAFETY) ×2 IMPLANT
COVER MAYO STAND STRL (DRAPES) ×2 IMPLANT
COVER TABLE BACK 60X90 (DRAPES) ×2 IMPLANT
DECANTER SPIKE VIAL GLASS SM (MISCELLANEOUS) IMPLANT
DERMABOND ADVANCED (GAUZE/BANDAGES/DRESSINGS) ×1
DERMABOND ADVANCED .7 DNX12 (GAUZE/BANDAGES/DRESSINGS) ×1 IMPLANT
DEVICE DUBIN W/COMP PLATE 8390 (MISCELLANEOUS) ×2 IMPLANT
DRAPE LAPAROTOMY TRNSV 102X78 (DRAPE) ×2 IMPLANT
DRAPE UTILITY XL STRL (DRAPES) ×2 IMPLANT
ELECT REM PT RETURN 9FT ADLT (ELECTROSURGICAL) ×2
ELECTRODE REM PT RTRN 9FT ADLT (ELECTROSURGICAL) ×1 IMPLANT
GLOVE BIO SURGEON STRL SZ7 (GLOVE) ×2 IMPLANT
GLOVE BIOGEL PI IND STRL 7.0 (GLOVE) ×2 IMPLANT
GLOVE BIOGEL PI INDICATOR 7.0 (GLOVE) ×2
GLOVE EUDERMIC 7 POWDERFREE (GLOVE) ×2 IMPLANT
GLOVE SKINSENSE NS SZ7.0 (GLOVE) ×1
GLOVE SKINSENSE STRL SZ7.0 (GLOVE) ×1 IMPLANT
GOWN PREVENTION PLUS XLARGE (GOWN DISPOSABLE) ×8 IMPLANT
KIT MARKER MARGIN INK (KITS) ×2 IMPLANT
NEEDLE HYPO 25X1 1.5 SAFETY (NEEDLE) ×2 IMPLANT
NS IRRIG 1000ML POUR BTL (IV SOLUTION) ×2 IMPLANT
PACK BASIN DAY SURGERY FS (CUSTOM PROCEDURE TRAY) ×2 IMPLANT
PENCIL BUTTON HOLSTER BLD 10FT (ELECTRODE) ×2 IMPLANT
SLEEVE SCD COMPRESS KNEE MED (MISCELLANEOUS) ×2 IMPLANT
SPONGE GAUZE 4X4 12PLY (GAUZE/BANDAGES/DRESSINGS) IMPLANT
SPONGE INTESTINAL PEANUT (DISPOSABLE) IMPLANT
SPONGE LAP 4X18 X RAY DECT (DISPOSABLE) ×2 IMPLANT
STAPLER VISISTAT 35W (STAPLE) IMPLANT
SUT MNCRL AB 4-0 PS2 18 (SUTURE) ×2 IMPLANT
SUT SILK 0 TIES 10X30 (SUTURE) IMPLANT
SUT VICRYL 3-0 CR8 SH (SUTURE) ×2 IMPLANT
SYR CONTROL 10ML LL (SYRINGE) ×2 IMPLANT
TOWEL OR NON WOVEN STRL DISP B (DISPOSABLE) ×2 IMPLANT
TUBE CONNECTING 20X1/4 (TUBING) ×2 IMPLANT
WATER STERILE IRR 1000ML POUR (IV SOLUTION) ×2 IMPLANT
YANKAUER SUCT BULB TIP NO VENT (SUCTIONS) ×2 IMPLANT

## 2012-01-29 NOTE — Anesthesia Procedure Notes (Signed)
Procedure Name: LMA Insertion Performed by: Kasidee Voisin W Pre-anesthesia Checklist: Patient identified, Timeout performed, Emergency Drugs available, Suction available and Patient being monitored Patient Re-evaluated:Patient Re-evaluated prior to inductionOxygen Delivery Method: Circle system utilized Preoxygenation: Pre-oxygenation with 100% oxygen Intubation Type: IV induction Ventilation: Mask ventilation without difficulty LMA: LMA with gastric port inserted LMA Size: 4.0 Number of attempts: 1 Placement Confirmation: breath sounds checked- equal and bilateral and positive ETCO2 Tube secured with: Tape Dental Injury: Teeth and Oropharynx as per pre-operative assessment      

## 2012-01-29 NOTE — Discharge Instructions (Signed)
[facility] °[phone] ° ° °BREAST BIOPSY/ PARTIAL MASTECTOMY: POST OP INSTRUCTIONS ° °Always review your discharge instruction sheet given to you by the facility where your surgery was performed. ° °IF YOU HAVE DISABILITY OR FAMILY LEAVE FORMS, YOU MUST BRING THEM TO THE OFFICE FOR PROCESSING.  DO NOT GIVE THEM TO YOUR DOCTOR. ° °1. A prescription for pain medication will be given to you upon discharge.  Take your pain medication as prescribed, as needed.  If narcotic pain medicine is not needed, then you may take ibuprofen (Advil) as needed. °2. Take your usually prescribed medications unless otherwise directed °3. If you need a refill on your pain medication, please contact your pharmacy.  They will contact our office to request authorization.  Prescriptions will not be filled after 5pm or on week-ends. °4. You should eat very light the first 24 hours after surgery, such as soup, crackers, pudding, etc.  Resume your normal diet the day after surgery. °5. Most patients will experience some swelling and bruising in the breast.  Ice packs and a good support bra will help.  Swelling and bruising can take several days to resolve.  °6. It is common to experience some constipation if taking pain medication after surgery.  Increasing fluid intake and taking a stool softener will usually help or prevent this problem from occurring.  A mild laxative (Milk of Magnesia or Miralax) should be taken according to package directions if there are no bowel movements after 48 hours. °7. Unless discharge instructions indicate otherwise, you may remove your bandages 24 hours after surgery, and you may shower at that time.  If your surgeon used skin glue on the incision, you may shower in 24 hours.  The glue will flake off over the next 2-3 weeks. °8. DRAINS:  If you have drain, it is important to keep a list of the amount of drainage produced each day in your drains.  Before leaving the hospital, you should  be instructed on drain care.  Call our office if you have any questions about your drains. BE SURE TO BRING THE RECORD OF THE AMOUNT OF DRAINAGE TO YOUR OFFICE VISITS. We use this to determine when the drains can be removed. °9. ACTIVITIES:  You may resume regular daily activities (gradually increasing) beginning the next day.  Wearing a good support bra or sports bra minimizes pain and swelling.  You may have sexual intercourse when it is comfortable. °a. You may drive when you no longer are taking prescription pain medication, you can comfortably wear a seatbelt, and you can safely maneuver your car and apply brakes. °b. RETURN TO WORK:  ______________________________________________________________________________________ °10. You should see your doctor in the office for a follow-up appointment approximately two weeks after your surgery.  Your doctor’s nurse will typically make your follow-up appointment when she calls you with your pathology report.  Expect your pathology report 2-3 business days after your surgery.  You may call to check if you do not hear from us after three days. °11. OTHER INSTRUCTIONS: _______________________________________________________________________________________________ _____________________________________________________________________________________________________________________________________ °_____________________________________________________________________________________________________________________________________ °_____________________________________________________________________________________________________________________________________ ° °WHEN TO CALL YOUR DOCTOR: °1. Fever over 101.0 °2. Nausea and/or vomiting. °3. Extreme swelling or bruising. °4. Continued bleeding from incision. °5. Increased pain, redness, or drainage from the incision. ° °The clinic staff is available to answer your questions during regular business hours.  Please don’t  hesitate to call and ask to speak to one of the nurses for clinical concerns.  If you have a medical emergency, go   to the nearest emergency room or call 911.  A surgeon from [facility] is always on call at the hospital. ° °[address], [address], [place], [place]  [address] ?  °[address] [address], [place], [place]   [address] °                          [phone] ? [phone] ? FAX [phone] °Web site: [email or web address] ° ° ° °Post Anesthesia Home Care Instructions ° °Activity: °Get plenty of rest for the remainder of the day. A responsible adult should stay with you for 24 hours following the procedure.  °For the next 24 hours, DO NOT: °-Drive a car °-Operate machinery °-Drink alcoholic beverages °-Take any medication unless instructed by your physician °-Make any legal decisions or sign important papers. ° °Meals: °Start with liquid foods such as gelatin or soup. Progress to regular foods as tolerated. Avoid greasy, spicy, heavy foods. If nausea and/or vomiting occur, drink only clear liquids until the nausea and/or vomiting subsides. Call your physician if vomiting continues. ° °Special Instructions/Symptoms: °Your throat may feel dry or sore from the anesthesia or the breathing tube placed in your throat during surgery. If this causes discomfort, gargle with warm salt water. The discomfort should disappear within 24 hours. ° ° °

## 2012-01-29 NOTE — Anesthesia Preprocedure Evaluation (Signed)
Anesthesia Evaluation  Patient identified by MRN, date of birth, ID band Patient awake    Reviewed: Allergy & Precautions, H&P , NPO status , Patient's Chart, lab work & pertinent test results, reviewed documented beta blocker date and time   Airway Mallampati: II TM Distance: >3 FB Neck ROM: full    Dental   Pulmonary neg pulmonary ROS,          Cardiovascular negative cardio ROS      Neuro/Psych negative neurological ROS  negative psych ROS   GI/Hepatic negative GI ROS, Neg liver ROS,   Endo/Other  negative endocrine ROS  Renal/GU negative Renal ROS  negative genitourinary   Musculoskeletal   Abdominal   Peds  Hematology negative hematology ROS (+)   Anesthesia Other Findings See surgeon's H&P   Reproductive/Obstetrics negative OB ROS                           Anesthesia Physical Anesthesia Plan  ASA: II  Anesthesia Plan: General   Post-op Pain Management:    Induction: Intravenous  Airway Management Planned: LMA  Additional Equipment:   Intra-op Plan:   Post-operative Plan: Extubation in OR  Informed Consent: I have reviewed the patients History and Physical, chart, labs and discussed the procedure including the risks, benefits and alternatives for the proposed anesthesia with the patient or authorized representative who has indicated his/her understanding and acceptance.   Dental Advisory Given  Plan Discussed with: CRNA and Surgeon  Anesthesia Plan Comments:         Anesthesia Quick Evaluation  

## 2012-01-29 NOTE — Anesthesia Postprocedure Evaluation (Signed)
Anesthesia Post Note  Patient: Christine Norman  Procedure(s) Performed: Procedure(s) (LRB): BREAST BIOPSY WITH NEEDLE LOCALIZATION (Left)  Anesthesia type: General  Patient location: PACU  Post pain: Pain level controlled  Post assessment: Patient's Cardiovascular Status Stable  Last Vitals:  Filed Vitals:   01/29/12 1430  BP: 109/62  Pulse: 61  Temp:   Resp: 14    Post vital signs: Reviewed and stable  Level of consciousness: alert  Complications: No apparent anesthesia complications

## 2012-01-29 NOTE — Op Note (Signed)
Christine Norman  Apr 23, 1961  [id number]  01/29/2012   Preoperative diagnosis: Atypical hyperplasia, left breast, found on core biopsy  Postoperative diagnosis: Same  Procedure: Wire localized left breast biopsy  Surgeon: Currie Paris, MD, FACS  Anesthesia: General  Clinical History and Indications: this patient presents for a guidewire localized excision of a Left breast Abnormality found on mammogram and indeterminant by core biopsy with atypical hyperplasia found.  Description of procedure: The patient was seen in the holding area and the plans for the procedure reviewed. The left breast was marked as the operative side. The wire localizing films were reviewed.  The patient was taken to the operating room and after satisfactory general anesthesia had been obtained the left breast was prepped and draped and the timeout was performed.  The incision was made over the presumed area of the abnormality as noted by the clip placed at NCB. Skin flaps were raised and using cautery the area was completely excised. Bleeders were controlled with either cautery or sutures as needed.  The specimen margins were inked for orientation purposes and the specimen x-ray. The clip appeared to be in the middle. The specimen was sent to pathology.  After achieving hemostasis, the incision was infiltrated with 0.25% plain Marcaine, then closed with 3-0 Vicryl, 4-0 Monocryl subcuticular, and Dermabond. The patient tolerated the procedure well. There were no operative complications. All counts were correct.   EBL: Minimal  Currie Paris, MD, FACS 01/29/2012 1:43 PM

## 2012-01-29 NOTE — H&P (View-Only) (Signed)
NAME: Christine Norman DOB: 07/23/1961 MRN: [id number]                                                                                      DATE: 01/18/2012  PCP: Collier, Amanda, PA-C, PA-C Referring Provider: Bertrand, Margaret Lins*  IMPRESSION:  Atypical hyperplasia left breast  PLAN:   Wire localized biopsy of left breast. I have discussed the indications for the lumpectomy and described the procedure. She understand that the chance of removal of the abnormal area is very good, but that occasionally we are unable to locate it and may have to do a second procedure. We also discussed the possibility of a second procedure to get additional tissue. Risks of surgery such as bleeding and infection have also been explained, as well as the implications of not doing the surgery. She understands and wishes to proceed.                  CC:  Chief Complaint  Patient presents with  . Breast Problem    new pt- eval L breast mass    HPI:  Christine Norman is a 51 y.o.  female who presents for evaluation of Atypical hyperplasia found on needle core biopsy of the left breast. The patient had routine mammograms done and a small mass was seen in the left breast. Biopsy was done showing some sclerosing adenosis but also some atypical ductal hyperplasia. Excisional biopsy was recommended. The patient is having no breast symptoms, has had no prior breast problems, and has a negative family history for breast cancer. She comes to the office today for evaluation and possible surgical excision of this area.  PMH:  has a past medical history of Clotting disorder; Hyperlipidemia; Thyroid disease; and Arthritis.  PSH:   has past surgical history that includes Knee arthroscopy (2000) and Cholecystectomy (2001).  ALLERGIES:   Allergies  Allergen Reactions  . Celebrex (Celecoxib) Hives    MEDICATIONS: Current outpatient prescriptions:buPROPion (WELLBUTRIN XL) 150 MG 24 hr tablet, daily., Disp: , Rfl: ;  cholecalciferol  (VITAMIN D) 1000 UNITS tablet, Take 1,000 Units by mouth daily., Disp: , Rfl: ;  diclofenac (VOLTAREN) 75 MG EC tablet, BID times 48H., Disp: , Rfl: ;  levothyroxine (SYNTHROID, LEVOTHROID) 50 MCG tablet, Take 75 mcg by mouth daily., Disp: , Rfl: ;  rosuvastatin (CRESTOR) 10 MG tablet, Take 10 mg by mouth daily., Disp: , Rfl:  traMADol (ULTRAM) 50 MG tablet, Take 50 mg by mouth every 6 (six) hours as needed., Disp: , Rfl:   ROS: She has filled out our 12 point review of systems and it is negative.  EXAM:   GENERAL:  The patient is alert, oriented, and generally healthy-appearing, NAD. Mood and affect are normal.  HEENT:  The head is normocephalic, the eyes nonicteric, the pupils were round regular and equal. EOMs are normal. Pharynx normal. Dentition good.  NECK:  The neck is supple and there are no masses or thyromegaly.  LUNGS: Normal respirations and clear to auscultation.  HEART: Regular rhythm, with no murmurs rubs or gallops. Pulses are intact carotid dorsalis pedis and posterior tibial. No significant varicosities are noted.  BREASTS:   No mass,no abnormality.  LYMPHATICS: No axillary or supraclavicular adenopathy  ABDOMEN: Soft, flat, and nontender. No masses or organomegaly is noted. No hernias are noted. Bowel sounds are normal.  EXTREMITIES:  Good range of motion, no edema.Mild varicositied   DATA REVIEWED:  Mammogram films and reports, path report.    Xiamara Hulet J 01/18/2012  CC: Bertrand, Margaret Lins*, Collier, Amanda, PA-C, PA-C        

## 2012-01-29 NOTE — Transfer of Care (Signed)
Immediate Anesthesia Transfer of Care Note  Patient: Christine Norman  Procedure(s) Performed: Procedure(s) (LRB): BREAST BIOPSY WITH NEEDLE LOCALIZATION (Left)  Patient Location: PACU  Anesthesia Type: General  Level of Consciousness: awake, alert  and oriented  Airway & Oxygen Therapy: Patient Spontanous Breathing and Patient connected to face mask oxygen  Post-op Assessment: Report given to PACU RN, Post -op Vital signs reviewed and stable and Patient moving all extremities  Post vital signs: Reviewed and stable  Complications: No apparent anesthesia complications

## 2012-01-29 NOTE — Interval H&P Note (Signed)
History and Physical Interval Note:  01/29/2012 12:47 PM  Christine Norman  has presented today for surgery, with the diagnosis of Atypical hyperplasia left breast  The various methods of treatment have been discussed with the patient and family. After consideration of risks, benefits and other options for treatment, the patient has consented to  Procedure(s) (LRB): BREAST BIOPSY WITH NEEDLE LOCALIZATION (Left) as a surgical intervention .  The patients' history has been reviewed, patient examined, no change in status, stable for surgery.  I have reviewed the patients' chart and labs.  Questions were answered to the patient's satisfaction.     Amandajo Gonder J

## 2012-01-30 ENCOUNTER — Encounter (HOSPITAL_BASED_OUTPATIENT_CLINIC_OR_DEPARTMENT_OTHER): Payer: Self-pay | Admitting: Surgery

## 2012-02-01 ENCOUNTER — Telehealth (INDEPENDENT_AMBULATORY_CARE_PROVIDER_SITE_OTHER): Payer: Self-pay | Admitting: General Surgery

## 2012-02-01 NOTE — Telephone Encounter (Signed)
Patient called to check on her path results again.  I told her they are still not in.  Lesly Rubenstein is watching for those and will call her by the end of the day to let her know.  She wants to be called on her cell

## 2012-02-01 NOTE — Telephone Encounter (Signed)
Pt calling to see if path results are available.  Reviewed labs and told her "not yet."  She is anxious for them, and please call her cell # when results available:  [phone].

## 2012-02-01 NOTE — Telephone Encounter (Signed)
Called patient and made her aware path results not back. Will call when they are available.

## 2012-02-05 NOTE — Telephone Encounter (Signed)
Patient aware path is benign. She will call with any questions and follow up at her scheduled appt.

## 2012-02-15 LAB — HM MAMMOGRAPHY

## 2012-02-19 ENCOUNTER — Ambulatory Visit (INDEPENDENT_AMBULATORY_CARE_PROVIDER_SITE_OTHER): Payer: 59 | Admitting: Surgery

## 2012-02-19 ENCOUNTER — Encounter (INDEPENDENT_AMBULATORY_CARE_PROVIDER_SITE_OTHER): Payer: Self-pay | Admitting: Surgery

## 2012-02-19 VITALS — BP 108/78 | HR 80 | Temp 97.8°F | Resp 14 | Ht 64.0 in | Wt 204.0 lb

## 2012-02-19 DIAGNOSIS — N6089 Other benign mammary dysplasias of unspecified breast: Secondary | ICD-10-CM

## 2012-02-19 DIAGNOSIS — N6099 Unspecified benign mammary dysplasia of unspecified breast: Secondary | ICD-10-CM

## 2012-02-19 NOTE — Patient Instructions (Signed)
Continue to have annual mammograms on your regular schedule.  We will see you again on an as needed basis. Please call the office at [phone] if you have any questions or concerns. Thank you for allowing [place] to take care of you.

## 2012-02-19 NOTE — Progress Notes (Signed)
NAME: Christine Norman                                            DOB: [birth year] DATE: 02/19/2012                                                  MRN: [id number]  CC: Post op   HPI: This patient comes in for post op follow-up.Sheunderwent Wire localized excisional biopsy of an area that had shown atypical hyperplasia on core biopsy on 01/29/12. She feels that she is doing well.  PE:  VITAL SIGNS: BP 108/78  Pulse 80  Temp 97.8 F (36.6 C) (Temporal)  Resp 14  Ht 5\' 4"  (1.626 m)  Wt 204 lb (92.534 kg)  BMI 35.02 kg/m2  General: The patient appears to be healthy, NAD Breasts: The incision is healing nicely. No significant hematoma. No evidence of infection.  DATA REVIEWED: The side port showed complex sclerosing lesion with fibrosis and cysts. No atypia or malignancy noted.  IMPRESSION: The patient is doing well S/P Left breast biopsy.    PLAN: We'll see her again when necessary. Recommended that she continue to have annual mammograms.Review the pathology report with her and gave her a copy for her records

## 2012-03-06 ENCOUNTER — Encounter (INDEPENDENT_AMBULATORY_CARE_PROVIDER_SITE_OTHER): Payer: Self-pay

## 2012-07-09 ENCOUNTER — Other Ambulatory Visit: Payer: Self-pay

## 2012-07-09 DIAGNOSIS — D229 Melanocytic nevi, unspecified: Secondary | ICD-10-CM

## 2012-07-09 HISTORY — DX: Melanocytic nevi, unspecified: D22.9

## 2012-08-07 ENCOUNTER — Other Ambulatory Visit: Payer: Self-pay | Admitting: Dermatology

## 2012-10-30 ENCOUNTER — Other Ambulatory Visit (HOSPITAL_COMMUNITY)
Admission: RE | Admit: 2012-10-30 | Discharge: 2012-10-30 | Disposition: A | Discharge status: Home / Self Care | Payer: 59 | Source: Ambulatory Visit | Attending: Internal Medicine | Admitting: Internal Medicine

## 2012-10-30 ENCOUNTER — Other Ambulatory Visit: Payer: Self-pay

## 2012-10-30 DIAGNOSIS — Z01419 Encounter for gynecological examination (general) (routine) without abnormal findings: Secondary | ICD-10-CM | POA: Insufficient documentation

## 2012-10-30 DIAGNOSIS — Z1151 Encounter for screening for human papillomavirus (HPV): Secondary | ICD-10-CM | POA: Insufficient documentation

## 2012-10-30 DIAGNOSIS — N760 Acute vaginitis: Secondary | ICD-10-CM | POA: Insufficient documentation

## 2013-06-16 ENCOUNTER — Encounter: Payer: Self-pay | Admitting: Internal Medicine

## 2013-06-16 DIAGNOSIS — I10 Essential (primary) hypertension: Secondary | ICD-10-CM | POA: Insufficient documentation

## 2013-06-16 DIAGNOSIS — Z8639 Personal history of other endocrine, nutritional and metabolic disease: Secondary | ICD-10-CM | POA: Insufficient documentation

## 2013-06-16 DIAGNOSIS — M1990 Unspecified osteoarthritis, unspecified site: Secondary | ICD-10-CM | POA: Insufficient documentation

## 2013-06-16 DIAGNOSIS — E785 Hyperlipidemia, unspecified: Secondary | ICD-10-CM

## 2013-06-16 DIAGNOSIS — F329 Major depressive disorder, single episode, unspecified: Secondary | ICD-10-CM

## 2013-06-16 DIAGNOSIS — E079 Disorder of thyroid, unspecified: Secondary | ICD-10-CM | POA: Insufficient documentation

## 2013-06-16 DIAGNOSIS — F32A Depression, unspecified: Secondary | ICD-10-CM | POA: Insufficient documentation

## 2013-06-16 DIAGNOSIS — K219 Gastro-esophageal reflux disease without esophagitis: Secondary | ICD-10-CM | POA: Insufficient documentation

## 2013-06-16 DIAGNOSIS — E119 Type 2 diabetes mellitus without complications: Secondary | ICD-10-CM

## 2013-06-26 ENCOUNTER — Ambulatory Visit: Payer: Self-pay | Admitting: Physician Assistant

## 2013-07-10 ENCOUNTER — Encounter: Payer: Self-pay | Admitting: Physician Assistant

## 2013-07-10 ENCOUNTER — Ambulatory Visit: Payer: 59 | Admitting: Physician Assistant

## 2013-07-10 VITALS — BP 110/70 | HR 60 | Temp 98.2°F | Resp 16 | Ht 63.5 in | Wt 179.0 lb

## 2013-07-10 DIAGNOSIS — G4700 Insomnia, unspecified: Secondary | ICD-10-CM

## 2013-07-10 DIAGNOSIS — F411 Generalized anxiety disorder: Secondary | ICD-10-CM

## 2013-07-10 DIAGNOSIS — E039 Hypothyroidism, unspecified: Secondary | ICD-10-CM

## 2013-07-10 MED ORDER — TEMAZEPAM 15 MG PO CAPS: 15.0000 mg | ORAL_CAPSULE | Freq: Every evening | ORAL | Status: DC | PRN

## 2013-07-10 MED ORDER — DULOXETINE HCL 60 MG PO CPEP: 60.0000 mg | ORAL_CAPSULE | Freq: Every day | ORAL | Status: DC

## 2013-07-10 NOTE — Progress Notes (Signed)
HPI Patient presents for a one month follow up. Patient was seen on 05/20/2013 for memory problems. Patient was taking her thyroid medication incorrectly and TSH was 0.017 and she had a low iron, patient has not done the hemoccult card.  She has now been taking her levothyroxine once daily. Patient states that she is still very stressed, Patient states that she has had a hard time sleeping and took her boyfriends temazepam and states it helped her sleep. Also she states that she does not know if the cymbalta is helping her mood much. .    Past Medical History  Diagnosis Date  . Clotting disorder     history of 2 blood clots  . Hyperlipidemia   . Thyroid disease     hypothyroidism  . Arthritis     ankles and back  . DVT (deep venous thrombosis), right 2000    rt knee-ankle  . Back pain   . Atypical ductal hyperplasia of breast 01/18/2012    Surgical Biopsy on 01/29/2012 showed a complex sclerosing lesion including sclerosing adenosis, stromal fibrosis, and cysts.   . Diabetes mellitus without complication   . Hypertension   . GERD (gastroesophageal reflux disease)     IBS  . Depression      Allergies  Allergen Reactions  . Celebrex [Celecoxib] Hives  . Etodolac     SOB/pain      Current Outpatient Prescriptions on File Prior to Visit  Medication Sig Dispense Refill  . buPROPion (WELLBUTRIN XL) 150 MG 24 hr tablet daily.      . cholecalciferol (VITAMIN D) 1000 UNITS tablet Take 1,000 Units by mouth daily.      . diclofenac (VOLTAREN) 75 MG EC tablet BID times 48H.      . rosuvastatin (CRESTOR) 10 MG tablet Take 10 mg by mouth every other day.       . traMADol (ULTRAM) 50 MG tablet Take 50 mg by mouth every 6 (six) hours as needed.       No current facility-administered medications on file prior to visit.    ROS: all negative expect above.   Physical: Filed Weights   07/10/13 0845  Weight: 179 lb (81.194 kg)   Filed Vitals:   07/10/13 0845  BP: 110/70  Pulse: 60   Temp: 98.2 F (36.8 C)  Resp: 16   General Appearance: Well nourished, in no apparent distress. Eyes: PERRLA, EOMs. Sinuses: No Frontal/maxillary tenderness ENT/Mouth: Ext aud canals clear, normal light reflex with TMs without erythema, bulging. Post pharynx without erythema, swelling, exudate.  Respiratory: CTAB Cardio: RRR, no murmurs, rubs or gallops. Peripheral pulses brisk and equal bilaterally, without edema. No aortic or femoral bruits. Abdomen: Flat, soft, with bowl sounds. Nontender, no guarding, rebound. Lymphatics: Non tender without lymphadenopathy.  Musculoskeletal: Full ROM all peripheral extremities, 5/5 strength, and normal gait. Skin: Warm, dry without rashes, lesions, ecchymosis.  Neuro: Cranial nerves intact, reflexes equal bilaterally. Normal muscle tone, no cerebellar symptoms. Sensation intact.  Psych: Awake and oriented X 3, normal affect, Insight and Judgment appropriate.   Assessment and Plan:  1. Unspecified hypothyroidism - TSH  2. Generalized anxiety disorder/FM Increase the cymbalta to 60mg  to see if it helps mood and back symptoms - DULoxetine (CYMBALTA) 60 MG capsule; Take 1 capsule (60 mg total) by mouth daily.  Dispense: 90 capsule; Refill: 1 -Can try the adderall 10mg  once during the day for "FM fog" brain.   3. Insomnia Good sleep hygiene.  - temazepam (RESTORIL) 15 MG  capsule; Take 1 capsule (15 mg total) by mouth at bedtime as needed for sleep.  Dispense: 90 capsule; Refill: 1  Follow up in one month

## 2013-08-05 ENCOUNTER — Ambulatory Visit: Payer: Self-pay | Admitting: Physician Assistant

## 2013-08-07 ENCOUNTER — Ambulatory Visit (INDEPENDENT_AMBULATORY_CARE_PROVIDER_SITE_OTHER): Payer: 59 | Admitting: Physician Assistant

## 2013-08-07 ENCOUNTER — Encounter: Payer: Self-pay | Admitting: Physician Assistant

## 2013-08-07 VITALS — BP 128/80 | HR 64 | Temp 97.5°F | Resp 16 | Ht 63.5 in | Wt 182.0 lb

## 2013-08-07 DIAGNOSIS — E782 Mixed hyperlipidemia: Secondary | ICD-10-CM

## 2013-08-07 DIAGNOSIS — R5381 Other malaise: Secondary | ICD-10-CM

## 2013-08-07 DIAGNOSIS — I10 Essential (primary) hypertension: Secondary | ICD-10-CM

## 2013-08-07 DIAGNOSIS — E559 Vitamin D deficiency, unspecified: Secondary | ICD-10-CM

## 2013-08-07 DIAGNOSIS — E119 Type 2 diabetes mellitus without complications: Secondary | ICD-10-CM

## 2013-08-07 DIAGNOSIS — E785 Hyperlipidemia, unspecified: Secondary | ICD-10-CM

## 2013-08-07 DIAGNOSIS — E079 Disorder of thyroid, unspecified: Secondary | ICD-10-CM

## 2013-08-07 LAB — BASIC METABOLIC PANEL WITH GFR
CO2: 28 mEq/L (ref 19–32)
Calcium: 9.1 mg/dL (ref 8.4–10.5)
Creat: 0.78 mg/dL (ref 0.50–1.10)
GFR, Est African American: >89 mL/min
Glucose, Bld: 83 mg/dL (ref 70–99)

## 2013-08-07 LAB — CBC WITH DIFFERENTIAL/PLATELET
Basophils Relative: 1 % (ref 0–1)
Eosinophils Absolute: 0.3 10*3/uL (ref 0.0–0.7)
Eosinophils Relative: 4 % (ref 0–5)
Lymphs Abs: 1.9 10*3/uL (ref 0.7–4.0)
MCH: 27.8 pg (ref 26.0–34.0)
MCHC: 33.7 g/dL (ref 30.0–36.0)
MCV: 82.4 fL (ref 78.0–100.0)
Platelets: 215 10*3/uL (ref 150–400)
RBC: 5.07 MIL/uL (ref 3.87–5.11)

## 2013-08-07 LAB — HEPATIC FUNCTION PANEL
ALT: 15 U/L (ref 0–35)
AST: 18 U/L (ref 0–37)
Albumin: 4.2 g/dL (ref 3.5–5.2)
Total Bilirubin: 0.5 mg/dL (ref 0.3–1.2)

## 2013-08-07 LAB — HEMOGLOBIN A1C
Hgb A1c MFr Bld: 5.7 % — ABNORMAL HIGH (ref <5.7)
Mean Plasma Glucose: 117 mg/dL — ABNORMAL HIGH (ref <117)

## 2013-08-07 LAB — LIPID PANEL
Cholesterol: 251 mg/dL — ABNORMAL HIGH (ref 0–200)
Triglycerides: 174 mg/dL — ABNORMAL HIGH (ref <150)

## 2013-08-07 MED ORDER — AMPHETAMINE-DEXTROAMPHETAMINE 10 MG PO TABS: 10.0000 mg | ORAL_TABLET | Freq: Three times a day (TID) | ORAL | Status: DC | PRN

## 2013-08-07 MED ORDER — CITALOPRAM HYDROBROMIDE 40 MG PO TABS: ORAL_TABLET | ORAL | Status: DC

## 2013-08-07 NOTE — Patient Instructions (Addendum)
1) start the adderall 1-2 times daily, you can even take 2 in the morning at the same time if you wish.  2) If this does not help you concentrate and you do not feel a differenence on the cymbalta lets switch after the holidays.   Get citalopram (Celexa) start to take 1/2  every other day and take the cymbalta every other day for 2-4 weeks. Then slowly stop the cymbalta and swtich to celexa, at this time you can add back on the wellbutrin if you wish.   Fibromyalgia Fibromyalgia is a disorder that is often misunderstood. It is associated with muscular pains and tenderness that comes and goes. It is often associated with fatigue and sleep disturbances. Though it tends to be long-lasting, fibromyalgia is not life-threatening. CAUSES  The exact cause of fibromyalgia is unknown. People with certain gene types are predisposed to developing fibromyalgia and other conditions. Certain factors can play a role as triggers, such as:  Spine disorders.  Arthritis.  Severe injury (trauma) and other physical stressors.  Emotional stressors. SYMPTOMS   The main symptom is pain and stiffness in the muscles and joints, which can vary over time.  Sleep and fatigue problems. Other related symptoms may include:  Bowel and bladder problems.  Headaches.  Visual problems.  Problems with odors and noises.  Depression or mood changes.  Painful periods (dysmenorrhea).  Dryness of the skin or eyes. DIAGNOSIS  There are no specific tests for diagnosing fibromyalgia. Patients can be diagnosed accurately from the specific symptoms they have. The diagnosis is made by determining that nothing else is causing the problems. TREATMENT  There is no cure. Management includes medicines and an active, healthy lifestyle. The goal is to enhance physical fitness, decrease pain, and improve sleep. HOME CARE INSTRUCTIONS   Only take over-the-counter or prescription medicines as directed by your caregiver. Sleeping  pills, tranquilizers, and pain medicines may make your problems worse.  Low-impact aerobic exercise is very important and advised for treatment. At first, it may seem to make pain worse. Gradually increasing your tolerance will overcome this feeling.  Learning relaxation techniques and how to control stress will help you. Biofeedback, visual imagery, hypnosis, muscle relaxation, yoga, and meditation are all options.  Anti-inflammatory medicines and physical therapy may provide short-term help.  Acupuncture or massage treatments may help.  Take muscle relaxant medicines as suggested by your caregiver.  Avoid stressful situations.  Plan a healthy lifestyle. This includes your diet, sleep, rest, exercise, and friends.  Find and practice a hobby you enjoy.  Join a fibromyalgia support group for interaction, ideas, and sharing advice. This may be helpful. SEEK MEDICAL CARE IF:  You are not having good results or improvement from your treatment. FOR MORE INFORMATION  National Fibromyalgia Association: www.fmaware.org Arthritis Foundation: www.arthritis.org Document Released: 08/06/2005 Document Revised: 10/29/2011 Document Reviewed: 11/16/2009 [facility] Patient Information 2014 [place], [place].

## 2013-08-07 NOTE — Progress Notes (Signed)
HPI Patient presents for 3 month follow up with hypertension, hyperlipidemia, prediabetes and vitamin D. Patient's blood pressure has been controlled at home, today their BP is BP: 128/80 mmHg  Patient denies chest pain, shortness of breath, dizziness.  Patient's cholesterol is diet controlled. In addition they are on cresotr and denies myalgias. The cholesterol last visit was  The patient has been working on diet and exercise for prediabetes, and denies changes in vision, polys, and paresthesias. Patient is on Vitamin D supplement.   She has been having memory issues with myalgias, negative work up and presumed to be FM, so last visit we increased the cymbalta to the 60mg , added iron and she had been taking her synthroid incorrectly. Her last TSH was  Lab Results  Component Value Date   TSH 0.051* 07/10/2013   She then went to QD except take M,W,F.  She continues to have stress at work and states she has been crying.  Current Medications:  Current Outpatient Prescriptions on File Prior to Visit  Medication Sig Dispense Refill  . buPROPion (WELLBUTRIN XL) 150 MG 24 hr tablet daily.      . cholecalciferol (VITAMIN D) 1000 UNITS tablet Take 1,000 Units by mouth daily.      . diclofenac (VOLTAREN) 75 MG EC tablet BID times 48H.      . DULoxetine (CYMBALTA) 60 MG capsule Take 1 capsule (60 mg total) by mouth daily.  90 capsule  1  . levothyroxine (SYNTHROID, LEVOTHROID) 100 MCG tablet Take 100 mcg by mouth daily before breakfast.      . rosuvastatin (CRESTOR) 10 MG tablet Take 10 mg by mouth every other day.       . temazepam (RESTORIL) 15 MG capsule Take 1 capsule (15 mg total) by mouth at bedtime as needed for sleep.  90 capsule  1  . traMADol (ULTRAM) 50 MG tablet Take 50 mg by mouth every 6 (six) hours as needed.       No current facility-administered medications on file prior to visit.   Medical History:  Past Medical History  Diagnosis Date  . Clotting disorder    history of 2 blood clots  . Hyperlipidemia   . Thyroid disease     hypothyroidism  . Arthritis     ankles and back  . DVT (deep venous thrombosis), right 2000    rt knee-ankle  . Back pain   . Atypical ductal hyperplasia of breast 01/18/2012    Surgical Biopsy on 01/29/2012 showed a complex sclerosing lesion including sclerosing adenosis, stromal fibrosis, and cysts.   . Diabetes mellitus without complication   . Hypertension   . GERD (gastroesophageal reflux disease)     IBS  . Depression    Allergies:  Allergies  Allergen Reactions  . Celebrex [Celecoxib] Hives  . Etodolac     SOB/pain    ROS Constitutional: Denies fever, chills, headaches, insomnia, fatigue, night sweats Eyes: Denies redness, blurred vision, diplopia, discharge, itchy, watery eyes.  ENT: Denies congestion, post nasal drip, sore throat, earache, dental pain, Tinnitus, Vertigo, Sinus pain, snoring.  Cardio: Denies chest pain, palpitations, irregular heartbeat, dyspnea, diaphoresis, orthopnea, PND, claudication, edema Respiratory: denies cough, shortness of breath, wheezing.  Gastrointestinal: Denies dysphagia, heartburn, AB pain/ cramps, N/V, diarrhea, constipation, hematemesis, melena, hematochezia,  hemorrhoids Genitourinary: Denies dysuria, frequency, urgency, nocturia, hesitancy, discharge, hematuria, flank pain Musculoskeletal:+ myalgia  Denies stiffness, pain, swelling and strain/sprain. Skin: Denies pruritis, rash, changing in skin lesion Neuro: Denies Weakness, tremor, incoordination, spasms,  pain Psychiatric: + memory loss, Denies confusion,  sensory loss Endocrine: Denies change in weight, skin, hair change, nocturia Diabetic Polys, Denies visual blurring, hyper /hypo glycemic episodes, and paresthesia, Heme/Lymph: Denies Excessive bleeding, bruising, enlarged lymph nodes  Family history- Review and unchanged Social history- Review and unchanged Physical Exam: Filed Vitals:   08/07/13 0844  BP:  128/80  Pulse: 64  Temp: 97.5 F (36.4 C)  Resp: 16   Filed Weights   08/07/13 0844  Weight: 182 lb (82.555 kg)   General Appearance: Well nourished, in no apparent distress. Eyes: PERRLA, EOMs, conjunctiva no swelling or erythema Sinuses: No Frontal/maxillary tenderness ENT/Mouth: Ext aud canals clear, TMs without erythema, bulging. No erythema, swelling, or exudate on post pharynx.  Tonsils not swollen or erythematous. Hearing normal.  Neck: Supple, thyroid normal.  Respiratory: Respiratory effort normal, BS equal bilaterally without rales, rhonchi, wheezing or stridor.  Cardio: RRR with no MRGs. Brisk peripheral pulses without edema.  Abdomen: Soft, + BS.  Non tender, no guarding, rebound, hernias, masses. Lymphatics: Non tender without lymphadenopathy.  Musculoskeletal: Full ROM, 5/5 strength, normal gait.  Skin: Warm, dry without rashes, lesions, ecchymosis.  Neuro: Cranial nerves intact. Normal muscle tone, no cerebellar symptoms. Sensation intact.  Psych: Awake and oriented X 3, normal affect, Insight and Judgment appropriate.   Assessment and Plan:  Hypertension: Continue medication, monitor blood pressure at home.  Continue DASH diet. Cholesterol: Continue diet and exercise. Check cholesterol.  Pre-diabetes-Continue diet and exercise. Check A1C Vitamin D Def- check level and continue medications.  Hypothyroid- check TSH FM/depression/memory- negative neuro exam, try adderall 10 for "fog", if this does not help then get off cymbalta and try Celexa. Continue diet and meds as discussed. Further disposition pending results of labs.  Christine Norman 12:00 PM

## 2013-08-07 NOTE — Progress Notes (Deleted)
HPI   Past Medical History  Diagnosis Date  . Clotting disorder     history of 2 blood clots  . Hyperlipidemia   . Thyroid disease     hypothyroidism  . Arthritis     ankles and back  . DVT (deep venous thrombosis), right 2000    rt knee-ankle  . Back pain   . Atypical ductal hyperplasia of breast 01/18/2012    Surgical Biopsy on 01/29/2012 showed a complex sclerosing lesion including sclerosing adenosis, stromal fibrosis, and cysts.   . Diabetes mellitus without complication   . Hypertension   . GERD (gastroesophageal reflux disease)     IBS  . Depression      Allergies  Allergen Reactions  . Celebrex [Celecoxib] Hives  . Etodolac     SOB/pain      Current Outpatient Prescriptions on File Prior to Visit  Medication Sig Dispense Refill  . buPROPion (WELLBUTRIN XL) 150 MG 24 hr tablet daily.      . cholecalciferol (VITAMIN D) 1000 UNITS tablet Take 1,000 Units by mouth daily.      . diclofenac (VOLTAREN) 75 MG EC tablet BID times 48H.      . DULoxetine (CYMBALTA) 60 MG capsule Take 1 capsule (60 mg total) by mouth daily.  90 capsule  1  . levothyroxine (SYNTHROID, LEVOTHROID) 100 MCG tablet Take 100 mcg by mouth daily before breakfast.      . rosuvastatin (CRESTOR) 10 MG tablet Take 10 mg by mouth every other day.       . temazepam (RESTORIL) 15 MG capsule Take 1 capsule (15 mg total) by mouth at bedtime as needed for sleep.  90 capsule  1  . traMADol (ULTRAM) 50 MG tablet Take 50 mg by mouth every 6 (six) hours as needed.       No current facility-administered medications on file prior to visit.    ROS: all negative expect above.   Physical: Filed Weights   08/07/13 0844  Weight: 182 lb (82.555 kg)   Filed Vitals:   08/07/13 0844  BP: 128/80  Pulse: 64  Temp: 97.5 F (36.4 C)  Resp: 16   General Appearance: Well nourished, in no apparent distress. Eyes: PERRLA, EOMs. Sinuses: No Frontal/maxillary tenderness ENT/Mouth: Ext aud canals clear, normal light  reflex with TMs without erythema, bulging. Post pharynx without erythema, swelling, exudate.  Respiratory: CTAB Cardio: RRR, no murmurs, rubs or gallops. Peripheral pulses brisk and equal bilaterally, without edema. No aortic or femoral bruits. Abdomen: Soft, with bowl sounds. Nontender, no guarding, rebound. Lymphatics: Non tender without lymphadenopathy.  Musculoskeletal: Full ROM all peripheral extremities, 5/5 strength, and normal gait. Skin: Warm, dry without rashes, lesions, ecchymosis.  Neuro: Cranial nerves intact, reflexes equal bilaterally. Normal muscle tone, no cerebellar symptoms. Sensation intact.  Pysch: Awake and oriented X 3, normal affect, Insight and Judgment appropriate.   Assessment and Plan: Hyperlipidemia - Plan: Lipid panel  Thyroid disease - Plan: TSH  Diabetes mellitus without complication - Plan: Hemoglobin A1c  Hypertension - Plan: CBC with Differential, BASIC METABOLIC PANEL WITH GFR, Hepatic function panel  Unspecified vitamin D deficiency - Plan: Vit D  25 hydroxy (rtn osteoporosis monitoring)

## 2013-08-08 LAB — VITAMIN D 25 HYDROXY (VIT D DEFICIENCY, FRACTURES): Vit D, 25-Hydroxy: 39 ng/mL (ref 30–89)

## 2013-08-17 ENCOUNTER — Other Ambulatory Visit: Payer: Self-pay | Admitting: Physician Assistant

## 2013-09-16 ENCOUNTER — Other Ambulatory Visit: Payer: Self-pay | Admitting: Physician Assistant

## 2013-09-16 MED ORDER — AMPHETAMINE-DEXTROAMPHETAMINE 10 MG PO TABS: 10.0000 mg | ORAL_TABLET | Freq: Three times a day (TID) | ORAL | Status: DC | PRN

## 2013-10-30 ENCOUNTER — Telehealth: Payer: Self-pay

## 2013-10-30 ENCOUNTER — Other Ambulatory Visit: Payer: Self-pay | Admitting: Physician Assistant

## 2013-10-30 MED ORDER — AMPHETAMINE-DEXTROAMPHETAMINE 10 MG PO TABS: 10.0000 mg | ORAL_TABLET | Freq: Three times a day (TID) | ORAL | Status: DC | PRN

## 2013-10-30 NOTE — Telephone Encounter (Signed)
Faxed Adderall 10 mg 1 tab TID to [facility]. Pt aware.

## 2013-11-02 ENCOUNTER — Encounter: Payer: Self-pay | Admitting: Physician Assistant

## 2013-11-10 ENCOUNTER — Other Ambulatory Visit: Payer: Self-pay | Admitting: Physician Assistant

## 2013-11-10 MED ORDER — AMPHETAMINE-DEXTROAMPHETAMINE 10 MG PO TABS: 10.0000 mg | ORAL_TABLET | Freq: Three times a day (TID) | ORAL | Status: DC | PRN

## 2013-11-19 ENCOUNTER — Other Ambulatory Visit: Payer: Self-pay

## 2013-11-19 ENCOUNTER — Telehealth: Payer: Self-pay | Admitting: *Deleted

## 2013-11-19 MED ORDER — AMPHETAMINE-DEXTROAMPHETAMINE 10 MG PO TABS: 10.0000 mg | ORAL_TABLET | Freq: Three times a day (TID) | ORAL | Status: DC | PRN

## 2013-11-19 NOTE — Telephone Encounter (Signed)
Return call to patient, lmom advised Adderall RX was faxed to [facility] on 11/10/13 per her request, advised if she has any new details to relay she can return call to office or call [facility]

## 2013-11-19 NOTE — Telephone Encounter (Signed)
Spoke with patient again and resent [facility] mail to [place], [place], [place] [id number]

## 2013-11-19 NOTE — Telephone Encounter (Signed)
PLEASE CALL PT NEEDS TO SPEAK WITH YOU IN REFERENCE TO A RX WE HAVE BEEN TRYING TO GET DONE FOR HER

## 2013-12-07 ENCOUNTER — Encounter: Payer: Self-pay | Admitting: Physician Assistant

## 2014-02-24 ENCOUNTER — Ambulatory Visit (INDEPENDENT_AMBULATORY_CARE_PROVIDER_SITE_OTHER): Payer: 59 | Admitting: Physician Assistant

## 2014-02-24 ENCOUNTER — Encounter: Payer: Self-pay | Admitting: Physician Assistant

## 2014-02-24 ENCOUNTER — Ambulatory Visit: Payer: 59 | Admitting: Neurology

## 2014-02-24 VITALS — BP 120/78 | HR 68 | Temp 97.7°F | Resp 16 | Ht 63.5 in | Wt 197.0 lb

## 2014-02-24 DIAGNOSIS — E079 Disorder of thyroid, unspecified: Secondary | ICD-10-CM

## 2014-02-24 DIAGNOSIS — E785 Hyperlipidemia, unspecified: Secondary | ICD-10-CM

## 2014-02-24 DIAGNOSIS — IMO0002 Reserved for concepts with insufficient information to code with codable children: Secondary | ICD-10-CM

## 2014-02-24 DIAGNOSIS — I10 Essential (primary) hypertension: Secondary | ICD-10-CM

## 2014-02-24 DIAGNOSIS — IMO0001 Reserved for inherently not codable concepts without codable children: Secondary | ICD-10-CM

## 2014-02-24 DIAGNOSIS — E119 Type 2 diabetes mellitus without complications: Secondary | ICD-10-CM

## 2014-02-24 DIAGNOSIS — E559 Vitamin D deficiency, unspecified: Secondary | ICD-10-CM

## 2014-02-24 DIAGNOSIS — Z79899 Other long term (current) drug therapy: Secondary | ICD-10-CM

## 2014-02-24 LAB — CBC WITH DIFFERENTIAL/PLATELET
BASOS ABS: 0.1 10*3/uL (ref 0.0–0.1)
BASOS PCT: 1 % (ref 0–1)
EOS PCT: 3 % (ref 0–5)
Eosinophils Absolute: 0.2 10*3/uL (ref 0.0–0.7)
HEMATOCRIT: 40 % (ref 36.0–46.0)
HEMOGLOBIN: 13.5 g/dL (ref 12.0–15.0)
Lymphocytes Relative: 25 % (ref 12–46)
Lymphs Abs: 2 10*3/uL (ref 0.7–4.0)
MCH: 28.6 pg (ref 26.0–34.0)
MCHC: 33.8 g/dL (ref 30.0–36.0)
MCV: 84.7 fL (ref 78.0–100.0)
MONO ABS: 0.5 10*3/uL (ref 0.1–1.0)
MONOS PCT: 6 % (ref 3–12)
Neutro Abs: 5.1 10*3/uL (ref 1.7–7.7)
Neutrophils Relative %: 65 % (ref 43–77)
Platelets: 232 10*3/uL (ref 150–400)
RBC: 4.72 MIL/uL (ref 3.87–5.11)
RDW: 14.1 % (ref 11.5–15.5)
WBC: 7.8 10*3/uL (ref 4.0–10.5)

## 2014-02-24 LAB — HEMOGLOBIN A1C
Hgb A1c MFr Bld: 5.7 % — ABNORMAL HIGH (ref <5.7)
Mean Plasma Glucose: 117 mg/dL — ABNORMAL HIGH (ref <117)

## 2014-02-24 LAB — SEDIMENTATION RATE: Sed Rate: 4 mm/hr (ref 0–22)

## 2014-02-24 MED ORDER — LIDOCAINE 5 % EX PTCH: 1.0000 | MEDICATED_PATCH | Freq: Two times a day (BID) | CUTANEOUS | Status: DC

## 2014-02-24 NOTE — Patient Instructions (Signed)
Try lidoderm patches Discuss tapering off topamax with Dr. Lovenia Shuck and trying lyrica    Bad carbs also include fruit juice, alcohol, and sweet tea. These are empty calories that do not signal to your brain that you are full.   Please remember the good carbs are still carbs which convert into sugar. So please measure them out no more than 1/2-1 cup of rice, oatmeal, pasta, and beans.  Veggies are however free foods! Pile them on.   I like lean protein at every meal such as chicken, [place], pork chops, cottage cheese, etc. Just do not fry these meats and please center your meal around vegetable, the meats should be a side dish.   No all fruit is created equal. Please see the list below, the fruit at the bottom is higher in sugars than the fruit at the top

## 2014-02-24 NOTE — Progress Notes (Signed)
Assessment and Plan:  Hypertension: Continue medication, monitor blood pressure at home. Continue DASH diet. Cholesterol: Continue diet and exercise. Check cholesterol.  Pre-diabetes-Continue diet and exercise. Check A1C Vitamin D Def- check level and continue medications.  Myalgias/versus nerve quality to pain, has had neg MRI/EMG- check CPK, check HLAB27, has had negative autoimmune work up June 2014. Questionable if topamax is helping, she is following up with Dr. Lovenia Shuck, will have her call/discuss with him, did give lidoderm patches. patient is also requesting a referral to Neuro to [facility] for her back pain. Needs to request MRIs  Continue diet and meds as discussed. Further disposition pending results of labs.  HPI 53 y.o. female  presents for 3 month follow up with hypertension, hyperlipidemia, prediabetes and vitamin D. Her blood pressure has been controlled at home, today their BP is BP: 120/78 mmHg She does not workout. She denies chest pain, shortness of breath, dizziness.  She is on cholesterol medication and denies myalgias. Her cholesterol is not at goal. The cholesterol last visit was:   Lab Results  Component Value Date   CHOL 251* 08/07/2013   HDL 51 08/07/2013   LDLCALC 165* 08/07/2013   TRIG 174* 08/07/2013   CHOLHDL 4.9 08/07/2013   She has been working on diet and exercise for prediabetes, and denies polydipsia and polyuria. Last A1C in the office was:  Lab Results  Component Value Date   HGBA1C 5.7* 08/07/2013   Patient is on Vitamin D supplement.   Lab Results  Component Value Date   VD25OH 39 08/07/2013     Patient gets cervical ESI with Dr. Maryjean Ka every 3 months, she has been sent for PT which has helped. Her last ESI was in May in her thoracic back. She has been having radiating pain from thoracic to around under her right breast, this has improved some but she continues to have tingling and pain bilateral hands. NCV tests negative right arm but possible carpal  tunnel. Current pain is stinging/tingling/numb pain along bilateral thoracic muscles without any radiation to the front. She was started on Topamax and has been increased to 100mg  daily.   Current Medications:    Medication List       This list is accurate as of: 02/24/14 11:07 AM.  Always use your most recent med list.               amphetamine-dextroamphetamine 10 MG tablet  Commonly known as:  ADDERALL  Take 1 tablet (10 mg total) by mouth 3 (three) times daily as needed.     cholecalciferol 1000 UNITS tablet  Commonly known as:  VITAMIN D  Take 1,000 Units by mouth daily.     diclofenac 75 MG EC tablet  Commonly known as:  VOLTAREN  BID times 48H.     DULoxetine 60 MG capsule  Commonly known as:  CYMBALTA  Take 1 capsule (60 mg total) by mouth daily.     levothyroxine 100 MCG tablet  Commonly known as:  SYNTHROID, LEVOTHROID  TAKE 1 TABLET DAILY     methocarbamol 500 MG tablet  Commonly known as:  ROBAXIN  Take 500 mg by mouth 3 (three) times daily.     rosuvastatin 10 MG tablet  Commonly known as:  CRESTOR  Take 10 mg by mouth every other day.     temazepam 15 MG capsule  Commonly known as:  RESTORIL  Take 1 capsule (15 mg total) by mouth at bedtime as needed for sleep.  topiramate 25 MG tablet  Commonly known as:  TOPAMAX  Take 25 mg by mouth. Take 2 tablets twice daily       Medical History:  Past Medical History  Diagnosis Date  . Clotting disorder     history of 2 blood clots  . Hyperlipidemia   . Thyroid disease     hypothyroidism  . Arthritis     ankles and back  . DVT (deep venous thrombosis), right 2000    rt knee-ankle  . Back pain   . Atypical ductal hyperplasia of breast 01/18/2012    Surgical Biopsy on 01/29/2012 showed a complex sclerosing lesion including sclerosing adenosis, stromal fibrosis, and cysts.   . Diabetes mellitus without complication   . Hypertension   . GERD (gastroesophageal reflux disease)     IBS  . Depression     Allergies:  Allergies  Allergen Reactions  . Celebrex [Celecoxib] Hives  . Etodolac     SOB/pain     Review of Systems: [X]  = complains of  [ ]  = denies  General: Fatigue [ ]  Fever [ ]  Chills [ ]  Weakness [ ]   Insomnia [ ]  Eyes: Redness [ ]  Blurred vision [ ]  Diplopia [ ]   ENT: Congestion [ ]  Sinus Pain [ ]  Post Nasal Drip [ ]  Sore Throat [ ]  Earache [ ]   Cardiac: Chest pain/pressure [ ]  SOB [ ]  Orthopnea [ ]   Palpitations [ ]   Paroxysmal nocturnal dyspnea[ ]  Claudication [ ]  Edema [ ]   Pulmonary: Cough [ ]  Wheezing[ ]   SOB [ ]   Snoring [ ]   GI: Nausea [ ]  Vomiting[ ]  Dysphagia[ ]  Heartburn[x ] Abdominal pain [ ]  Constipation [ ] ; Diarrhea [ ] ; BRBPR [ ]  Melena[ ]  GU: Hematuria[ ]  Dysuria [ ]  Nocturia[ ]  Urgency [ ]   Hesitancy [ ]  Discharge [ ]  Neuro: Headaches[ ]  Vertigo[ ]  Paresthesias[ ]  Spasm [ ]  Speech changes [ ]  Incoordination [ ]   Ortho: Arthritis [ ]  Joint pain [ ]  Muscle pain [x ] Joint swelling [ ]  Back Pain [ x] Skin:  Rash [ ]   Pruritis [ ]  Change in skin lesion [ ]   Psych: Depression[ ]  Anxiety[ ]  Confusion [ ]  Memory loss [ ]   Heme/Lypmh: Bleeding [ ]  Bruising [ ]  Enlarged lymph nodes [ ]   Endocrine: Visual blurring [ ]  Paresthesia [ ]  Polyuria [ ]  Polydypsea [ ]    Heat/cold intolerance [ ]  Hypoglycemia [ ]   Family history- Review and unchanged Social history- Review and unchanged Physical Exam: BP 120/78  Pulse 68  Temp(Src) 97.7 F (36.5 C)  Resp 16  Ht 5' 3.5" (1.613 m)  Wt 197 lb (89.359 kg)  BMI 34.35 kg/m2 Wt Readings from Last 3 Encounters:  02/24/14 197 lb (89.359 kg)  08/07/13 182 lb (82.555 kg)  07/10/13 179 lb (81.194 kg)   General Appearance: Well nourished, in no apparent distress. Eyes: PERRLA, EOMs, conjunctiva no swelling or erythema Sinuses: No Frontal/maxillary tenderness ENT/Mouth: Ext aud canals clear, TMs without erythema, bulging. No erythema, swelling, or exudate on post pharynx.  Tonsils not swollen or erythematous. Hearing  normal.  Neck: Supple, thyroid normal.  Respiratory: Respiratory effort normal, BS equal bilaterally without rales, rhonchi, wheezing or stridor.  Cardio: RRR with no MRGs. Brisk peripheral pulses without edema.  Abdomen: Soft, + BS.  + RUQ tender, no guarding, rebound, hernias, masses. Lymphatics: Non tender without lymphadenopathy.  Musculoskeletal: Back with paraspinal muscle tenderness, without spinal process tenderness gait antalgic Skin: Warm,  dry without rashes, lesions, ecchymosis.  Neuro: Cranial nerves intact. Normal muscle tone, no cerebellar symptoms. Sensation intact.  Psych: Awake and oriented X 3, normal affect, Insight and Judgment appropriate.   Christine Norman 11:01 AM

## 2014-02-25 LAB — LIPID PANEL
Cholesterol: 184 mg/dL (ref 0–200)
HDL: 54 mg/dL (ref >39)
LDL CALC: 88 mg/dL (ref 0–99)
TRIGLYCERIDES: 209 mg/dL — AB (ref <150)
Total CHOL/HDL Ratio: 3.4 Ratio
VLDL: 42 mg/dL — ABNORMAL HIGH (ref 0–40)

## 2014-02-25 LAB — HEPATIC FUNCTION PANEL
ALT: 13 U/L (ref 0–35)
AST: 17 U/L (ref 0–37)
Albumin: 4.5 g/dL (ref 3.5–5.2)
Alkaline Phosphatase: 59 U/L (ref 39–117)
Bilirubin, Direct: 0.1 mg/dL (ref 0.0–0.3)
Indirect Bilirubin: 0.4 mg/dL (ref 0.2–1.2)
TOTAL PROTEIN: 6.8 g/dL (ref 6.0–8.3)
Total Bilirubin: 0.5 mg/dL (ref 0.2–1.2)

## 2014-02-25 LAB — BASIC METABOLIC PANEL WITH GFR
BUN: 10 mg/dL (ref 6–23)
CALCIUM: 9.2 mg/dL (ref 8.4–10.5)
CO2: 27 mEq/L (ref 19–32)
Chloride: 106 mEq/L (ref 96–112)
Creat: 0.91 mg/dL (ref 0.50–1.10)
GFR, EST AFRICAN AMERICAN: 84 mL/min
GFR, EST NON AFRICAN AMERICAN: 73 mL/min
GLUCOSE: 128 mg/dL — AB (ref 70–99)
POTASSIUM: 4.4 meq/L (ref 3.5–5.3)
SODIUM: 141 meq/L (ref 135–145)

## 2014-02-25 LAB — VITAMIN D 25 HYDROXY (VIT D DEFICIENCY, FRACTURES): Vit D, 25-Hydroxy: 40 ng/mL (ref 30–89)

## 2014-02-25 LAB — CK: CK TOTAL: 82 U/L (ref 7–177)

## 2014-02-25 LAB — TSH: TSH: 2.794 u[IU]/mL (ref 0.350–4.500)

## 2014-02-25 LAB — INSULIN, FASTING: Insulin fasting, serum: 35 u[IU]/mL — ABNORMAL HIGH (ref 3–28)

## 2014-02-25 LAB — MAGNESIUM: Magnesium: 2 mg/dL (ref 1.5–2.5)

## 2014-02-26 LAB — HLA-B27 ANTIGEN: DNA Result:: NOT DETECTED

## 2014-03-01 ENCOUNTER — Telehealth: Payer: Self-pay

## 2014-03-01 NOTE — Telephone Encounter (Signed)
Patient called and wants to go ahead with referral to back specialist in [place]. Patient didn't know the name?

## 2014-03-02 ENCOUNTER — Other Ambulatory Visit: Payer: Self-pay | Admitting: Physician Assistant

## 2014-03-02 NOTE — Telephone Encounter (Signed)
No, I can reopen the referral. Thank you

## 2014-03-03 MED ORDER — AMPHETAMINE-DEXTROAMPHETAMINE 10 MG PO TABS: 10.0000 mg | ORAL_TABLET | Freq: Three times a day (TID) | ORAL | Status: DC | PRN

## 2014-03-11 ENCOUNTER — Encounter: Payer: Self-pay | Admitting: Neurology

## 2014-03-11 ENCOUNTER — Ambulatory Visit (INDEPENDENT_AMBULATORY_CARE_PROVIDER_SITE_OTHER): Payer: 59 | Admitting: Neurology

## 2014-03-11 VITALS — BP 130/62 | HR 76 | Resp 18 | Ht 64.0 in | Wt 193.0 lb

## 2014-03-11 DIAGNOSIS — M5414 Radiculopathy, thoracic region: Secondary | ICD-10-CM

## 2014-03-11 DIAGNOSIS — R209 Unspecified disturbances of skin sensation: Secondary | ICD-10-CM

## 2014-03-11 DIAGNOSIS — IMO0002 Reserved for concepts with insufficient information to code with codable children: Secondary | ICD-10-CM

## 2014-03-11 DIAGNOSIS — G5603 Carpal tunnel syndrome, bilateral upper limbs: Secondary | ICD-10-CM

## 2014-03-11 DIAGNOSIS — R200 Anesthesia of skin: Secondary | ICD-10-CM

## 2014-03-11 DIAGNOSIS — G5600 Carpal tunnel syndrome, unspecified upper limb: Secondary | ICD-10-CM

## 2014-03-11 NOTE — Progress Notes (Signed)
NEUROLOGY CONSULTATION NOTE  Christine Norman MRN: [id number] DOB: [birth year]  Referring provider: Dr. Lovenia Shuck Primary care provider: Dr. Melford Aase  Reason for consult:  Numbness and tingling  HISTORY OF PRESENT ILLNESS: Emaree Chiu is a 53 year old right-handed woman with history of hypertension, hyperlipidemia, prediabetes and vitamin D deficiency who presents for numbness and tingling in the fingers.  Records personally reviewed.  He has history of neck pain, thoracic pain and back pain. For the past several months, she began noticing numbness and tingling in both hands, worse on the right. She describes it as a sensation that her hands are asleep. Initially it involved the thumbs and index fingers and later progressed to all her fingers. It radiates up to the forearms. It is particularly severe at night when she is in bed, or when she is doing any kind of activity that involves gripping (such as using the phone or while driving) or when working on the computer. She has not had any appreciable physiologic weakness. She has a history of chronic neck pain but she hasn't noticed any pain radiating down the arms. For the last couple of months, she also began noticing numbness and tingling sensation in the lower extremities. It usually starts in the lateral aspect of both feet and radiates up the lateral lower legs to just above the knees. There is Norman associated shooting pain from the back down the legs. There is Norman associated weakness of the legs.  She has had physical therapy.  She has been seen and treated by Dr. Lovenia Shuck at [facility] and has been getting cervical ESI every three months.  She also received ESI in her thoracic spine as well, as she also describes a tingling pain radiating across her back, usually on the right side, just above her bra strap. She has had imaging of the thoracic spine in the past but not recently.  She is also currently taking Topamax 111m daily.  02/21/14 LABS:   TSH 2.794, Hgb A1c 5.7, mean plasma glucose 117, vit D 25-hydroxy 40, Sed Rate 4, CK 82, HLA-B17 antigen negative  Work-up included: 12/23/13 NCV-EMG of upper extremities:  mild right carpal tunnel syndrome (borderline prolonged right median sensory latency).  Norman evidence of polyneuropathy or cervical radiculopathy.  12/09/13 MRI CERVICAL SPINE WO:  mild degenerative changes without central spinal or foraminal stenosis or nerve root impingement.  12/05/13 MRI LUMBAR SPINE WO:  mild lumbar spondylosis and degenerative disc disease without evidence of spinal or foraminal stenosis  PAST MEDICAL HISTORY: Past Medical History  Diagnosis Date  . Clotting disorder     history of 2 blood clots  . Hyperlipidemia   . Thyroid disease     hypothyroidism  . Arthritis     ankles and back  . DVT (deep venous thrombosis), right 2000    rt knee-ankle  . Back pain   . Atypical ductal hyperplasia of breast 01/18/2012    Surgical Biopsy on 01/29/2012 showed a complex sclerosing lesion including sclerosing adenosis, stromal fibrosis, and cysts.   . Diabetes mellitus without complication   . Hypertension   . GERD (gastroesophageal reflux disease)     IBS  . Depression     PAST SURGICAL HISTORY: Past Surgical History  Procedure Laterality Date  . Knee arthroscopy  2000    rt  . Cholecystectomy  2001    lap choli  . Hernia repair  03    rt ing hernia  . Hammer toe surgery  1997  rt and lt  . Fracture surgery  2001    orif rt fif with knee surg  . Breast biopsy  01/29/2012    Procedure: BREAST BIOPSY WITH NEEDLE LOCALIZATION;  Surgeon: Haywood Lasso, MD;  Location: [facility];  Service: General;  Laterality: Left;    MEDICATIONS: Current Outpatient Prescriptions on File Prior to Visit  Medication Sig Dispense Refill  . amphetamine-dextroamphetamine (ADDERALL) 10 MG tablet Take 1 tablet (10 mg total) by mouth 3 (three) times daily as needed.  270 tablet  0  . cholecalciferol  (VITAMIN D) 1000 UNITS tablet Take 1,000 Units by mouth daily.      . diclofenac (VOLTAREN) 75 MG EC tablet BID times 48H.      . DULoxetine (CYMBALTA) 60 MG capsule Take 1 capsule (60 mg total) by mouth daily.  90 capsule  1  . levothyroxine (SYNTHROID, LEVOTHROID) 100 MCG tablet TAKE 1 TABLET DAILY  90 tablet  1  . lidocaine (LIDODERM) 5 % Place 1 patch onto the skin every 12 (twelve) hours. Remove & Discard patch within 12 hours or as directed by MD  30 patch  3  . methocarbamol (ROBAXIN) 500 MG tablet Take 500 mg by mouth 3 (three) times daily.      . rosuvastatin (CRESTOR) 10 MG tablet Take 10 mg by mouth every other day.       . temazepam (RESTORIL) 15 MG capsule Take 1 capsule (15 mg total) by mouth at bedtime as needed for sleep.  90 capsule  1  . topiramate (TOPAMAX) 25 MG tablet Take 25 mg by mouth. Take 2 tablets twice daily       Norman current facility-administered medications on file prior to visit.    ALLERGIES: Allergies  Allergen Reactions  . Celebrex [Celecoxib] Hives  . Etodolac     SOB/pain    FAMILY HISTORY: Family History  Problem Relation Age of Onset  . Heart disease Maternal Grandmother   . Heart disease Maternal Grandfather   . Arthritis Mother   . Diabetes Mother   . Heart failure Father   . Atrial fibrillation Father     SOCIAL HISTORY: History   Social History  . Marital Status: Single    Spouse Name: N/A    Number of Children: N/A  . Years of Education: N/A   Occupational History  . Not on file.   Social History Main Topics  . Smoking status: Former Smoker -- 1.00 packs/day for 20 years    Quit date: 03/20/2010  . Smokeless tobacco: Never Used  . Alcohol Use: Yes  . Drug Use: Norman  . Sexual Activity: Norman   Other Topics Concern  . Not on file   Social History Narrative  . Norman narrative on file    REVIEW OF SYSTEMS: Constitutional: Norman fevers, chills, or sweats, Norman generalized fatigue, change in appetite Eyes: Norman visual changes, double  vision, eye pain Ear, nose and throat: Norman hearing loss, ear pain, nasal congestion, sore throat Cardiovascular: Norman chest pain, palpitations Respiratory:  Norman shortness of breath at rest or with exertion, wheezes GastrointestinaI: Norman nausea, vomiting, diarrhea, abdominal pain, fecal incontinence Genitourinary:  Norman dysuria, urinary retention or frequency Musculoskeletal:  Norman neck pain, back pain Integumentary: Norman rash, pruritus, skin lesions Neurological: as above Psychiatric: Norman depression, insomnia, anxiety Endocrine: Norman palpitations, fatigue, diaphoresis, mood swings, change in appetite, change in weight, increased thirst Hematologic/Lymphatic:  Norman anemia, purpura, petechiae. Allergic/Immunologic: Norman itchy/runny eyes, nasal congestion, recent allergic  reactions, rashes  PHYSICAL EXAM: There were Norman vitals filed for this visit. General: Norman acute distress Head:  Normocephalic/atraumatic Neck: supple, Norman paraspinal tenderness, full range of motion Back: Norman paraspinal tenderness Heart: regular rate and rhythm Lungs: Clear to auscultation bilaterally. Vascular: Norman carotid bruits. Neurological Exam: Mental status: alert and oriented to person, place, and time, recent and remote memory intact, fund of knowledge intact, attention and concentration intact, speech fluent and not dysarthric, language intact. Cranial nerves: CN I: not tested CN II: pupils equal, round and reactive to light, visual fields intact, fundi unremarkable, without vessel changes, exudates, hemorrhages or papilledema. CN III, IV, VI:  full range of motion, Norman nystagmus, Norman ptosis CN V: facial sensation intact CN VII: upper and lower face symmetric CN VIII: hearing intact CN IX, X: gag intact, uvula midline CN XI: sternocleidomastoid and trapezius muscles intact CN XII: tongue midline Bulk & Tone: normal, Norman fasciculations. Motor: 5 out of 5 throughout Sensation: Reduced pinprick sensation in the first and second  digits of both hands, as well as possibly the right lateral foot.  Also involves the right paraspinal region at T5 distribution. Vibration sensation intact. Deep Tendon Reflexes: 2+ throughout, toes downgoing. Finger to nose testing: Norman dysmetria Heel to shin: Norman dysmetria Gait: Normal station and stride. Able to turn, walk on toes, walk on heels, and in tandem. Romberg negative. Positive Phalen's and Tinel's test bilaterally.  IMPRESSION: Bilateral carpal tunnel syndrome. History and exam is consistent with this. Reviewing the nerve conduction study, she appears to have borderline carpal tunnel on the right. Palmer's were not performed. Numbness in the lower extremities. Etiology unknown. She does not follow a distribution of a length dependent peripheral neuropathy. The distribution she endorses is consistent with the L5 and S1 levels but there is Norman structural abnormality found on imaging. T5 radiculopathy.  Prior imaging reported Norman structural etiology.  Responded to [facility] in the past (not sure which level).  Pre-diabetes may be a cause.  PLAN: 1.  NCV-EMG of the upper extremities to evaluate more closely for carpal tunnel syndrome (such as the palmar studies). 2. Prescribed wrist splints. 3. If symptoms in the lower extremities get worse, we will consider NCV-EMG of the lower extremities. 4. Optimize control of mild hyperglycemia. 5. Followup after NCV-EMG.  45 minutes spent with the patient, over 50% spent counseling according care.   Thank you for allowing me to take part in the care of this patient.  Metta Clines, DO  CC:  Clydell Hakim, PhD, MD  Unk Pinto, MD

## 2014-03-11 NOTE — Patient Instructions (Signed)
I think you have carpal tunnel syndrome in both hands.  I'm not sure what could be causing the numbness in the feet.  It might be related to underlying peripheral neuropathy (possibly from pre-diabetes).  No cause in the back seen on MRI. 1.  Let's get another nerve test to evaluate carpal tunnel better 2.  Buy wrist splints to wear at night.  We will give you a prescription to [place] 3.  Get the pre-diabetes under control 4.  If the symptoms in the legs get worse, I would recommend the nerve test in the legs. 5.  Follow up after the nerve test.

## 2014-03-16 ENCOUNTER — Other Ambulatory Visit: Payer: Self-pay | Admitting: *Deleted

## 2014-03-16 DIAGNOSIS — R200 Anesthesia of skin: Secondary | ICD-10-CM

## 2014-03-19 ENCOUNTER — Telehealth: Payer: Self-pay | Admitting: Neurology

## 2014-03-19 NOTE — Telephone Encounter (Signed)
Christine Norman called on 03/19/14 at 8:01AM. She wants to f/u on the referral that Dr. Tomi Likens was going to send out for her to see a provider for her carpal tunnerl. C/B  [phone]

## 2014-03-22 ENCOUNTER — Telehealth: Payer: Self-pay | Admitting: Neurology

## 2014-03-22 NOTE — Telephone Encounter (Signed)
Pt made appt to see DR Posey Pronto 05-13-14 for EMG but she has some questions about test please call [phone]

## 2014-03-23 NOTE — Telephone Encounter (Signed)
Left message for patient to call me back. 

## 2014-03-24 NOTE — Telephone Encounter (Signed)
Left message for patient to call me back. 

## 2014-04-17 ENCOUNTER — Other Ambulatory Visit: Payer: Self-pay | Admitting: Emergency Medicine

## 2014-05-11 ENCOUNTER — Encounter: Payer: Self-pay | Admitting: Neurology

## 2014-05-13 ENCOUNTER — Ambulatory Visit (INDEPENDENT_AMBULATORY_CARE_PROVIDER_SITE_OTHER): Payer: 59 | Admitting: Neurology

## 2014-05-13 DIAGNOSIS — G5600 Carpal tunnel syndrome, unspecified upper limb: Secondary | ICD-10-CM

## 2014-05-13 DIAGNOSIS — G5603 Carpal tunnel syndrome, bilateral upper limbs: Secondary | ICD-10-CM

## 2014-05-13 NOTE — Procedures (Signed)
[facility] Neurology  [address]  [place], [place] [address] Tel: [phone] Fax:  [phone] Test Date:  05/13/2014  Patient: Christine Norman DOB: 11/12/1990 Physician: Narda Amber  Sex: Female Height: 5\' 4"  Ref Phys: Metta Clines  ID#: [id number]   Technician: Laureen Ochs R. NCS T.   Patient Complaints: Patient is a 53 year old female here for evaluation of worsening hand paresthesias, worse on the right.  NCV & EMG Findings: Extensive electrodiagnostic testing of the right upper extremity and additional studies of the left reveals:  1. Median, ulnar, and palmar sensory responses are within normal limits bilaterally. 2. Right ulnar response shows reduced amplitude recording at the abductor digiti minimi (7.1 mV) and reduced absolute conduction velocity across the elbow.  The right ulnar motor response recording at the first dorsal interosseous muscle shows prolonged distal latency (4.0 ms) with preserved amplitude. Bilateral median and the left ulnar response responses are within normal limits 3. Mild chronic motor axon loss changes are seen affecting the right ulnar innervated muscles, without accompanied active denervation.  Impression: 1. 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 Overall, these findings are mild-to-moderate in degree electrically. 2. There is no evidence of carpal tunnel syndrome or a cervical radiculopathy affecting the upper extremities.   ___________________________ Narda Amber    Nerve Conduction Studies Anti Sensory Summary Table   Stim Site NR Peak (ms) Norm Peak (ms) P-T Amp (V) Norm P-T Amp  Left Median Anti Sensory (2nd Digit)  34C  Wrist    2.8 <3.3 36.9 >20  Right Median Anti Sensory (2nd Digit)  35C  Wrist    3.1 <3.3 32.0 >20  Left Ulnar Anti Sensory (5th Digit)  35C  Wrist    2.9 <3.0 27.3 >18  Right Ulnar Anti Sensory (5th Digit)  35C  Wrist    2.7 <3.0 24.3 >18   Motor Summary Table   Stim Site NR Onset (ms) Norm Onset (ms) O-P Amp (mV) Norm O-P Amp Site1 Site2 Delta-0 (ms) Dist (cm) Vel (m/s) Norm Vel (m/s)  Left Median Motor (Abd Poll Brev)  34C  Wrist    2.8 <3.9 11.3 >6 Elbow Wrist 4.1 23.5 57 >51  Elbow    6.9  11.3         Right Median Motor (Abd Poll Brev)  35C  Wrist    2.8 <3.9 11.8 >6 Elbow Wrist 4.3 25.0 58 >51  Elbow    7.1  11.0         Left Ulnar Motor (Abd Dig Minimi)  34C  Wrist    2.3 <3.0 9.7 >8 B Elbow Wrist 3.6 20.0 56 >51  B Elbow    5.9  8.7  A Elbow B Elbow 1.6 10.0 63 >51  A Elbow    7.5  7.9  Axilla A Elbow 6.3 0.0    Right Ulnar Motor (Abd Dig Minimi)  35C  Wrist    2.3 <3.0 7.1 >8 B Elbow Wrist 3.2 22.0 69 >51  B Elbow    5.5  6.9  A Elbow B Elbow 1.8 10.0 56 >51  A Elbow    7.3  6.3         Right Ulnar (FDI) Motor (1st DI)  35C  Wrist    4.0 <3.8 15.5 >8         Comparison Summary Table   Stim Site NR Peak (ms) Norm Peak (ms) P-T Amp (V) Site1  Site2 Delta-P (ms) Norm Delta (ms)  Left Median/Ulnar Palm Comparison (Wrist - 8cm)  34C  Median Palm    1.8 <2.2 29.5 Median Palm Ulnar Palm 0.0   Ulnar Palm    1.8 <2.2 19.1      Right Median/Ulnar Palm Comparison (Wrist - 8cm)  35C  Median Palm    1.8 <2.2 28.1 Median Palm Ulnar Palm 0.2   Ulnar Palm    1.6 <2.2 14.9       EMG   Side Muscle Ins Act Fibs Psw Fasc Number Recrt Dur Dur. Amp Amp. Poly Poly. Comment  Right 1stDorInt Nml Nml Nml Nml 1- Mod-R Some 1+ Some 1+ Nml Nml N/A  Right ABD Dig Min Nml Nml Nml Nml 1- Mod-R Some 1+ Nml Nml Nml Nml N/A  Right FlexDigProf 4,5 Nml Nml Nml Nml 1- Mod-R Some 1+ Nml Nml Nml Nml N/A  Right Triceps Nml Nml Nml Nml Nml Nml Nml Nml Nml Nml Nml Nml N/A  Right PronatorTeres Nml Nml Nml Nml Nml Nml Nml Nml Nml Nml Nml Nml N/A  Right Biceps Nml Nml Nml Nml Nml Nml Nml Nml Nml Nml Nml Nml N/A  Right Ext Indicis Nml Nml Nml Nml Nml Nml Nml Nml Nml Nml Nml Nml N/A  Left 1stDorInt Nml Nml Nml Nml Nml Nml Nml Nml Nml Nml Nml Nml N/A  Left Ext Indicis  Nml Nml Nml Nml Nml Nml Nml Nml Nml Nml Nml Nml N/A  Left PronatorTeres Nml Nml Nml Nml Nml Nml Nml Nml Nml Nml Nml Nml N/A  Left Biceps Nml Nml Nml Nml Nml Nml Nml Nml Nml Nml Nml Nml N/A  Left Deltoid Nml Nml Nml Nml Nml Nml Nml Nml Nml Nml Nml Nml N/A      Waveforms:

## 2014-05-28 ENCOUNTER — Telehealth: Payer: Self-pay | Admitting: Neurology

## 2014-05-28 NOTE — Telephone Encounter (Signed)
Message copied by Annamaria Helling on Fri May 28, 2014  9:54 AM ------      Message from: JAFFE, ADAM R      Created: Fri May 28, 2014  6:56 AM       The EMG does not reveal carpal tunnel syndrome or any cause for the bilateral hand numbness.  It may still be carpal tunnel but very mild, and not severe enough to show up on EMG.  There is evidence of pinching of the nerve at the right elbow, which can cause numbness, but this would not explain her numbness and is likely an incidental finding.      ----- Message -----         From: Alda Berthold, DO         Sent: 05/13/2014   3:22 PM           To: Dudley Major, DO                   ------

## 2014-05-28 NOTE — Telephone Encounter (Signed)
Patient made aware of results. She will call if she needs [place].

## 2014-06-14 ENCOUNTER — Ambulatory Visit (INDEPENDENT_AMBULATORY_CARE_PROVIDER_SITE_OTHER): Payer: 59 | Admitting: Physician Assistant

## 2014-06-14 VITALS — BP 132/78 | HR 72 | Temp 97.0°F | Resp 16 | Ht 64.0 in | Wt 191.0 lb

## 2014-06-14 DIAGNOSIS — Z79899 Other long term (current) drug therapy: Secondary | ICD-10-CM

## 2014-06-14 DIAGNOSIS — E079 Disorder of thyroid, unspecified: Secondary | ICD-10-CM

## 2014-06-14 DIAGNOSIS — E559 Vitamin D deficiency, unspecified: Secondary | ICD-10-CM

## 2014-06-14 DIAGNOSIS — I10 Essential (primary) hypertension: Secondary | ICD-10-CM

## 2014-06-14 DIAGNOSIS — E119 Type 2 diabetes mellitus without complications: Secondary | ICD-10-CM

## 2014-06-14 DIAGNOSIS — E785 Hyperlipidemia, unspecified: Secondary | ICD-10-CM

## 2014-06-14 LAB — CBC WITH DIFFERENTIAL/PLATELET
BASOS ABS: 0.1 10*3/uL (ref 0.0–0.1)
Basophils Relative: 1 % (ref 0–1)
Eosinophils Absolute: 0.2 10*3/uL (ref 0.0–0.7)
Eosinophils Relative: 3 % (ref 0–5)
HEMATOCRIT: 38.1 % (ref 36.0–46.0)
HEMOGLOBIN: 13.2 g/dL (ref 12.0–15.0)
LYMPHS PCT: 28 % (ref 12–46)
Lymphs Abs: 1.6 10*3/uL (ref 0.7–4.0)
MCH: 28.9 pg (ref 26.0–34.0)
MCHC: 34.6 g/dL (ref 30.0–36.0)
MCV: 83.4 fL (ref 78.0–100.0)
MONOS PCT: 6 % (ref 3–12)
Monocytes Absolute: 0.3 10*3/uL (ref 0.1–1.0)
NEUTROS ABS: 3.5 10*3/uL (ref 1.7–7.7)
Neutrophils Relative %: 62 % (ref 43–77)
Platelets: 216 10*3/uL (ref 150–400)
RBC: 4.57 MIL/uL (ref 3.87–5.11)
RDW: 14.5 % (ref 11.5–15.5)
WBC: 5.7 10*3/uL (ref 4.0–10.5)

## 2014-06-14 LAB — LIPID PANEL
CHOL/HDL RATIO: 4.1 ratio
CHOLESTEROL: 203 mg/dL — AB (ref 0–200)
HDL: 50 mg/dL (ref >39)
LDL Cholesterol: 105 mg/dL — ABNORMAL HIGH (ref 0–99)
TRIGLYCERIDES: 239 mg/dL — AB (ref <150)
VLDL: 48 mg/dL — ABNORMAL HIGH (ref 0–40)

## 2014-06-14 LAB — HEPATIC FUNCTION PANEL
ALBUMIN: 4.2 g/dL (ref 3.5–5.2)
ALK PHOS: 70 U/L (ref 39–117)
ALT: 10 U/L (ref 0–35)
AST: 15 U/L (ref 0–37)
BILIRUBIN TOTAL: 0.4 mg/dL (ref 0.2–1.2)
Bilirubin, Direct: 0.1 mg/dL (ref 0.0–0.3)
Indirect Bilirubin: 0.3 mg/dL (ref 0.2–1.2)
TOTAL PROTEIN: 6.8 g/dL (ref 6.0–8.3)

## 2014-06-14 LAB — HEMOGLOBIN A1C
Hgb A1c MFr Bld: 5.5 % (ref <5.7)
Mean Plasma Glucose: 111 mg/dL (ref <117)

## 2014-06-14 LAB — BASIC METABOLIC PANEL WITH GFR
BUN: 13 mg/dL (ref 6–23)
CALCIUM: 8.7 mg/dL (ref 8.4–10.5)
CO2: 25 mEq/L (ref 19–32)
Chloride: 108 mEq/L (ref 96–112)
Creat: 1 mg/dL (ref 0.50–1.10)
GFR, EST AFRICAN AMERICAN: 75 mL/min
GFR, EST NON AFRICAN AMERICAN: 65 mL/min
Glucose, Bld: 103 mg/dL — ABNORMAL HIGH (ref 70–99)
Potassium: 4.1 mEq/L (ref 3.5–5.3)
Sodium: 139 mEq/L (ref 135–145)

## 2014-06-14 LAB — TSH: TSH: 6.803 u[IU]/mL — AB (ref 0.350–4.500)

## 2014-06-14 LAB — MAGNESIUM: MAGNESIUM: 2.3 mg/dL (ref 1.5–2.5)

## 2014-06-14 MED ORDER — AMPHETAMINE-DEXTROAMPHETAMINE 10 MG PO TABS: 10.0000 mg | ORAL_TABLET | Freq: Three times a day (TID) | ORAL | Status: DC | PRN

## 2014-06-14 MED ORDER — ROSUVASTATIN CALCIUM 10 MG PO TABS: 10.0000 mg | ORAL_TABLET | Freq: Every day | ORAL | Status: DC

## 2014-06-14 NOTE — Progress Notes (Signed)
Assessment and Plan:  Hypertension: Continue medication, monitor blood pressure at home. Continue DASH diet.  Reminder to go to the ER if any CP, SOB, nausea, dizziness, severe HA, changes vision/speech, left arm numbness and tingling and jaw pain. Cholesterol: Continue diet and exercise. Check cholesterol.  Diabetes-Continue diet and exercise. Check A1C Vitamin D Def- check level and continue medications.  Hypothyroidism-check TSH level, continue medications the same.  Lower back pain/radicular pain- will refer to different neurosurgeon per patient request.   Continue diet and meds as discussed. Further disposition pending results of labs. Discussed med's effects and SE's.    HPI 53 y.o. female  presents for 3 month follow up with hypertension, hyperlipidemia, diabetes and vitamin D. Her blood pressure has been controlled at home, today their BP is BP: 132/78 mmHg She does workout. She denies chest pain, shortness of breath, dizziness.  She is on cholesterol medication and denies myalgias. Her cholesterol is at goal. The cholesterol last visit was:   Lab Results  Component Value Date   CHOL 184 02/24/2014   HDL 54 02/24/2014   LDLCALC 88 02/24/2014   TRIG 209* 02/24/2014   CHOLHDL 3.4 02/24/2014   She has been working on diet and exercise for Diabetes, and denies polydipsia, polyuria and visual disturbances. Last A1C in the office was:  Lab Results  Component Value Date   HGBA1C 5.7* 02/24/2014   Patient is on Vitamin D supplement. Lab Results  Component Value Date   VD25OH 40 02/24/2014   She is on thyroid medication. Her medication was not changed last visit. Patient denies nervousness and palpitations.  Lab Results  Component Value Date   TSH 2.794 02/24/2014  .  She has chronic back pain, would like a referral to a different neurosurgeon and one with pain management in the group.  Current Medications:  Current Outpatient Prescriptions on File Prior to Visit  Medication Sig Dispense  Refill  . amphetamine-dextroamphetamine (ADDERALL) 10 MG tablet Take 1 tablet (10 mg total) by mouth 3 (three) times daily as needed.  270 tablet  0  . cholecalciferol (VITAMIN D) 1000 UNITS tablet Take 1,000 Units by mouth daily.      . diclofenac (VOLTAREN) 75 MG EC tablet BID times 48H.      Marland Kitchen levothyroxine (SYNTHROID, LEVOTHROID) 100 MCG tablet TAKE 1 TABLET DAILY  90 tablet  0  . methocarbamol (ROBAXIN) 500 MG tablet Take 500 mg by mouth 3 (three) times daily.      . rosuvastatin (CRESTOR) 10 MG tablet Take 10 mg by mouth every other day.       . temazepam (RESTORIL) 15 MG capsule Take 1 capsule (15 mg total) by mouth at bedtime as needed for sleep.  90 capsule  1  . topiramate (TOPAMAX) 25 MG tablet Take 25 mg by mouth. Take 2 tablets twice daily       No current facility-administered medications on file prior to visit.   Medical History:  Past Medical History  Diagnosis Date  . Clotting disorder     history of 2 blood clots  . Hyperlipidemia   . Thyroid disease     hypothyroidism  . Arthritis     ankles and back  . DVT (deep venous thrombosis), right 2000    rt knee-ankle  . Back pain   . Atypical ductal hyperplasia of breast 01/18/2012    Surgical Biopsy on 01/29/2012 showed a complex sclerosing lesion including sclerosing adenosis, stromal fibrosis, and cysts.   . Diabetes  mellitus without complication   . Hypertension   . GERD (gastroesophageal reflux disease)     IBS  . Depression    Allergies:  Allergies  Allergen Reactions  . Celebrex [Celecoxib] Hives  . Etodolac     SOB/pain     Review of Systems: [X]  = complains of  [ ]  = denies  General: Fatigue [ ]  Fever [ ]  Chills [ ]  Weakness [ ]   Insomnia [ ]  Eyes: Redness [ ]  Blurred vision [ ]  Diplopia [ ]   ENT: Congestion [ ]  Sinus Pain [ ]  Post Nasal Drip [ ]  Sore Throat [ ]  Earache [ ]   Cardiac: Chest pain/pressure [ ]  SOB [ ]  Orthopnea [ ]   Palpitations [ ]   Paroxysmal nocturnal dyspnea[ ]  Claudication [ ]  Edema  [ ]   Pulmonary: Cough [ ]  Wheezing[ ]   SOB [ ]   Snoring [ ]   GI: Nausea [ ]  Vomiting[ ]  Dysphagia[ ]  Heartburn[ ]  Abdominal pain [ ]  Constipation [ ] ; Diarrhea [ ] ; BRBPR [ ]  Melena[ ]  GU: Hematuria[ ]  Dysuria [ ]  Nocturia[ ]  Urgency [ ]   Hesitancy [ ]  Discharge [ ]  Neuro: Headaches[ ]  Vertigo[ ]  Paresthesias[ ]  Spasm [ ]  Speech changes [ ]  Incoordination [ ]   Ortho: Arthritis [ ]  Joint pain [ ]  Muscle pain [x ] Joint swelling [ ]  Back Pain [x ] Skin:  Rash [ ]   Pruritis [ ]  Change in skin lesion [ ]   Psych: Depression[ ]  Anxiety[ ]  Confusion [ ]  Memory loss [ ]   Heme/Lypmh: Bleeding [ ]  Bruising [ ]  Enlarged lymph nodes [ ]   Endocrine: Visual blurring [ ]  Paresthesia [ ]  Polyuria [ ]  Polydypsea [ ]    Heat/cold intolerance [ ]  Hypoglycemia [ ]   Family history- Review and unchanged Social history- Review and unchanged Physical Exam: BP 132/78  Pulse 72  Temp(Src) 97 F (36.1 C)  Resp 16  Wt 191 lb (86.637 kg) Wt Readings from Last 3 Encounters:  06/14/14 191 lb (86.637 kg)  03/11/14 193 lb (87.544 kg)  02/24/14 197 lb (89.359 kg)   General Appearance: Well nourished, in no apparent distress. Eyes: PERRLA, EOMs, conjunctiva no swelling or erythema Sinuses: No Frontal/maxillary tenderness ENT/Mouth: Ext aud canals clear, TMs without erythema, bulging. No erythema, swelling, or exudate on post pharynx.  Tonsils not swollen or erythematous. Hearing normal.  Neck: Supple, thyroid normal.  Respiratory: Respiratory effort normal, BS equal bilaterally without rales, rhonchi, wheezing or stridor.  Cardio: RRR with no MRGs. Brisk peripheral pulses without edema.  Abdomen: Soft, + BS.  Non tender, no guarding, rebound, hernias, masses. Lymphatics: Non tender without lymphadenopathy.  Musculoskeletal: Full ROM, 5/5 strength, normal gait.  Skin: Warm, dry without rashes, lesions, ecchymosis.  Neuro: Cranial nerves intact. No cerebellar symptoms. Sensation intact.  Psych: Awake and oriented X  3, normal affect, Insight and Judgment appropriate.    Vicie Mutters, PA-C 9:03 AM [facility]

## 2014-06-14 NOTE — Patient Instructions (Signed)

## 2014-06-15 LAB — INSULIN, FASTING: Insulin fasting, serum: 6.1 u[IU]/mL (ref 2.0–19.6)

## 2014-06-15 LAB — VITAMIN D 25 HYDROXY (VIT D DEFICIENCY, FRACTURES): VIT D 25 HYDROXY: 34 ng/mL (ref 30–89)

## 2014-06-17 ENCOUNTER — Other Ambulatory Visit: Payer: Self-pay | Admitting: Physician Assistant

## 2014-06-17 DIAGNOSIS — M1990 Unspecified osteoarthritis, unspecified site: Secondary | ICD-10-CM

## 2014-06-17 DIAGNOSIS — R200 Anesthesia of skin: Secondary | ICD-10-CM

## 2014-07-08 ENCOUNTER — Other Ambulatory Visit: Payer: Self-pay

## 2014-07-08 DIAGNOSIS — G4700 Insomnia, unspecified: Secondary | ICD-10-CM

## 2014-07-08 MED ORDER — LEVOTHYROXINE SODIUM 100 MCG PO TABS: 100.0000 ug | ORAL_TABLET | Freq: Every day | ORAL | Status: DC

## 2014-07-08 MED ORDER — TEMAZEPAM 15 MG PO CAPS: 15.0000 mg | ORAL_CAPSULE | Freq: Every evening | ORAL | Status: DC | PRN

## 2014-07-10 ENCOUNTER — Encounter: Payer: Self-pay | Admitting: *Deleted

## 2014-09-02 ENCOUNTER — Other Ambulatory Visit: Payer: Self-pay | Admitting: Physician Assistant

## 2014-09-02 MED ORDER — AMPHETAMINE-DEXTROAMPHETAMINE 10 MG PO TABS: 10.0000 mg | ORAL_TABLET | Freq: Three times a day (TID) | ORAL | Status: DC | PRN

## 2014-10-14 ENCOUNTER — Encounter: Payer: Self-pay | Admitting: Physician Assistant

## 2014-10-20 ENCOUNTER — Ambulatory Visit: Payer: 59 | Attending: Orthopaedic Surgery | Admitting: Physical Therapy

## 2014-10-20 DIAGNOSIS — M79671 Pain in right foot: Secondary | ICD-10-CM | POA: Diagnosis not present

## 2014-10-20 DIAGNOSIS — M79672 Pain in left foot: Secondary | ICD-10-CM | POA: Insufficient documentation

## 2014-10-20 DIAGNOSIS — M25373 Other instability, unspecified ankle: Secondary | ICD-10-CM | POA: Diagnosis not present

## 2014-10-20 NOTE — Patient Instructions (Signed)
Gastroc / Heel Cord Stretch - Seated With Towel   Sit on floor, towel around ball of foot. Gently pull foot in toward body, stretching heel cord and calf. Hold for _30__ seconds. Repeat on involved leg. Repeat __2-3_ times. Do _2-3__ times per day. Then pull more on one side to turn foot in and then out Do this before you get out of bed!!   THEN on a step, drop Rt./LT heel off step for a gentle stretch, hold 30 sec x 3 each side.   Stop if pain worsens.

## 2014-10-21 NOTE — Therapy (Signed)
[place], [place], [address] Phone: [phone]   Fax:  [phone]  Physical Therapy Evaluation  Patient Details  Name: Christine Norman MRN: [id number] Date of Birth: 07-31-1961 Referring Provider:  Marybelle Killings, MD  Encounter Date: 10/20/2014      PT End of Session - 10/20/14 1459    Visit Number 1   Number of Visits 12   Date for PT Re-Evaluation 12/01/14   PT Start Time 0737   PT Stop Time 1459   PT Time Calculation (min) 44 min   Activity Tolerance Patient tolerated treatment well   Behavior During Therapy [employer] for tasks assessed/performed      Past Medical History  Diagnosis Date  . Clotting disorder     history of 2 blood clots  . Hyperlipidemia   . Thyroid disease     hypothyroidism  . Arthritis     ankles and back  . DVT (deep venous thrombosis), right 2000    rt knee-ankle  . Back pain   . Atypical ductal hyperplasia of breast 01/18/2012    Surgical Biopsy on 01/29/2012 showed a complex sclerosing lesion including sclerosing adenosis, stromal fibrosis, and cysts.   . Diabetes mellitus without complication   . Hypertension   . GERD (gastroesophageal reflux disease)     IBS  . Depression     Past Surgical History  Procedure Laterality Date  . Knee arthroscopy  2000    rt  . Cholecystectomy  2001    lap choli  . Hernia repair  03    rt ing hernia  . Hammer toe surgery  1997    rt and lt  . Fracture surgery  2001    orif rt fif with knee surg  . Breast biopsy  01/29/2012    Procedure: BREAST BIOPSY WITH NEEDLE LOCALIZATION;  Surgeon: Haywood Lasso, MD;  Location: [facility];  Service: General;  Laterality: Left;    There were no vitals taken for this visit.  Visit Diagnosis:  Foot pain, bilateral  Ankle instability, unspecified laterality      Subjective Assessment - 10/20/14 1418    Symptoms Pt having flare up of a chronic problem in the past month.  Pain in Am and with brief sitting. Rt. >Lt. foot pain.    Pertinent History Known bone spurs, OA in feet and chronic PF for 20 yrs.  Back pain (disc) which has also returned.     How long can you sit comfortably? 30 min then stiffens   How long can you stand comfortably? 10 min max   How long can you walk comfortably? 10-20 min in grocery   Diagnostic tests had injections,  XR today   Patient Stated Goals pain relief    Currently in Pain? Yes   Pain Score 4   walking in to clinic   Pain Location Foot   Pain Orientation Right;Left   Pain Descriptors / Indicators Tightness;Burning;Aching;Sore   Pain Type Chronic pain   Pain Onset More than a month ago   Pain Frequency Intermittent   Aggravating Factors  1st step in the am, standing, walking   Pain Relieving Factors rest/sitting          [facility] PT Assessment - 10/20/14 1427    Assessment   Medical Diagnosis bilat. plantar fasciitis   Onset Date --  chronic   Next MD Visit 6 weeks   Prior Therapy Yes   Precautions  Precautions None   Restrictions   Weight Bearing Restrictions No   Balance Screen   Has the patient fallen in the past 6 months No   Has the patient had a decrease in activity level because of a fear of falling?  No   Is the patient reluctant to leave their home because of a fear of falling?  No   Prior Function   Vocation Full time employment   Vocation Requirements able to sit/stand, has modified desk to accomodate   Cognition   Overall Cognitive Status Within Functional Limits for tasks assessed   Observation/Other Assessments   Focus on Therapeutic Outcomes (FOTO)  NT   Sensation   Light Touch Appears Intact   Posture/Postural Control   Posture/Postural Control No significant limitations   Posture Comments arches are normal   AROM   Right Ankle Dorsiflexion 4   Right Ankle Plantar Flexion 53   Right Ankle Inversion 50   Right Ankle Eversion 15   Left Ankle Dorsiflexion 12   Left Ankle Plantar Flexion  55   Left Ankle Inversion 36   Left Ankle Eversion 30   Strength   Right Ankle Dorsiflexion 5/5   Right Ankle Plantar Flexion 3+/5   Right Ankle Inversion 4+/5   Right Ankle Eversion 4+/5   Palpation   Palpation Rt. foot painful lateral plantar aspect of foot and into forefoot, Lt foot mostly sore just in medial arch of foot.                   [place] [facility] Adult PT Treatment/Exercise - 10/20/14 1427    Ankle Exercises: Stretches   Plantar Fascia Stretch 3 reps;30 seconds   Gastroc Stretch 3 reps;30 seconds                PT Education - 10/20/14 1457    Education provided Yes   Education Details PT/POC, HEP, ankle stretch   Person(s) Educated Patient   Methods Explanation;Handout;Demonstration   Comprehension Verbalized understanding;Returned demonstration          PT Short Term Goals - 10/20/14 1503    PT SHORT TERM GOAL #1   Title Pt will be I with initial HEP   Time 3   Period Weeks   Status New   PT SHORT TERM GOAL #2   Title Pt will be able to sit for 30 min at a time and report less pain in feet (15-25% less)   Time 3   Period Weeks   Status New           PT Long Term Goals - 10/20/14 1503    PT LONG TERM GOAL #1   Title Pt wil be I with advanced HEP   Time 6   Period Weeks   Status New   PT LONG TERM GOAL #2   Title Pt will report pain in AM reduced to minimal when first stepping onto floor   Time 6   Period Weeks   Status New   PT LONG TERM GOAL #3   Title Pt will maintain (single limb) standing balance for min dynamic activities for 30 sec   Time 6   Period Weeks   Status New   PT LONG TERM GOAL #4   Title Pt will be able to walk for 20 min and report min discomfort in feet.    Time 6   Period Weeks   Status New   PT LONG TERM GOAL #5   Title Pt  will be able to stand/sit at work as needed with min pain overall in feet at the end of the day.    Time 6   Period Weeks   Status New               Plan - 10/20/14 1500     Clinical Impression Statement This patient presents with chronic foot problems and new flare up of plantar fasciitis Rt>Lt.  She has mild ankle weakness, imparied single leg balance and pain with work, home activities.  She will benefit from skilled PT to provide intervention to reduce functional limitations.     Pt will benefit from skilled therapeutic intervention in order to improve on the following deficits Difficulty walking;Impaired flexibility;Increased edema;Decreased activity tolerance;Increased fascial restricitons;Pain;Decreased mobility;Decreased strength   Rehab Potential Excellent   PT Frequency 2x / week   PT Duration 6 weeks   PT Treatment/Interventions ADLs/Self Care Home Management;Moist Heat;Therapeutic activities;Patient/family education;Passive range of motion;Therapeutic exercise;Ultrasound;Gait training;Balance training;Manual techniques;Neuromuscular re-education;Cryotherapy;Electrical Stimulation;Functional mobility training   PT Next Visit Plan review stretches, begin manual, [place] and consider taping/ionto   PT Home Exercise Plan gastroc stretches (towel and step)   Consulted and Agree with Plan of Care Patient         Problem List Patient Active Problem List   Diagnosis Date Noted  . Bilateral carpal tunnel syndrome 03/11/2014  . Numbness in both legs 03/11/2014  . Hyperlipidemia   . Thyroid disease   . Arthritis   . Diabetes mellitus without complication   . Hypertension   . GERD (gastroesophageal reflux disease)   . Depression     PAA,JENNIFER 10/21/2014, 12:21 PM  [facility] [address] [place], [place], [address] Phone: [phone]   Fax:  [phone]

## 2014-11-01 ENCOUNTER — Ambulatory Visit: Payer: 59 | Admitting: Rehabilitation

## 2014-11-01 DIAGNOSIS — M79671 Pain in right foot: Secondary | ICD-10-CM

## 2014-11-01 DIAGNOSIS — M25373 Other instability, unspecified ankle: Secondary | ICD-10-CM

## 2014-11-01 DIAGNOSIS — M79672 Pain in left foot: Secondary | ICD-10-CM

## 2014-11-01 NOTE — Therapy (Signed)
[place], [place], [address] Phone: [phone]   Fax:  [phone]  Physical Therapy Treatment  Patient Details  Name: Christine Norman MRN: [id number] Date of Birth: Oct 09, 1960 Referring Provider:  Unk Pinto, MD  Encounter Date: 11/01/2014      PT End of Session - 11/01/14 1730    Visit Number 2   Number of Visits 12   Date for PT Re-Evaluation 12/01/14   PT Start Time 0431   PT Stop Time 0510   PT Time Calculation (min) 39 min      Past Medical History  Diagnosis Date  . Clotting disorder     history of 2 blood clots  . Hyperlipidemia   . Thyroid disease     hypothyroidism  . Arthritis     ankles and back  . DVT (deep venous thrombosis), right 2000    rt knee-ankle  . Back pain   . Atypical ductal hyperplasia of breast 01/18/2012    Surgical Biopsy on 01/29/2012 showed a complex sclerosing lesion including sclerosing adenosis, stromal fibrosis, and cysts.   . Diabetes mellitus without complication   . Hypertension   . GERD (gastroesophageal reflux disease)     IBS  . Depression     Past Surgical History  Procedure Laterality Date  . Knee arthroscopy  2000    rt  . Cholecystectomy  2001    lap choli  . Hernia repair  03    rt ing hernia  . Hammer toe surgery  1997    rt and lt  . Fracture surgery  2001    orif rt fif with knee surg  . Breast biopsy  01/29/2012    Procedure: BREAST BIOPSY WITH NEEDLE LOCALIZATION;  Surgeon: Haywood Lasso, MD;  Location: [facility];  Service: General;  Laterality: Left;    There were no vitals filed for this visit.  Visit Diagnosis:  Ankle instability, unspecified laterality  Foot pain, bilateral      Subjective Assessment - 11/01/14 1633    Symptoms Wearing orthotics and good shoes. So, pain is a little better. The stretches dont seem to be helping my right heel but they are helping the left.    Currently in Pain? Yes    Pain Score 3   7/10 this morning    Aggravating Factors  getting up after sitting or resting.    Pain Relieving Factors sitting.                        [facility] Adult PT Treatment/Exercise - 11/01/14 0001    Knee/Hip Exercises: Standing   Heel Raises 15 reps   Manual Therapy   Manual Therapy Massage   Massage Left greater than right gastroc and soleus massage, knots noted and smoothed in right distal gasrtoc. Also bilateral plantar fascia strumming and stretch   Ankle Exercises: Stretches   Soleus Stretch 3 reps;30 seconds   Gastroc Stretch 3 reps;30 seconds  edge of step                PT Education - 11/01/14 1729    Education provided Yes   Education Details Ice Massage 3 x per day, soleus stretch, bilateral heel raises, continue step stretch and strap stretch   Person(s) Educated Patient   Methods Explanation;Handout   Comprehension Verbalized understanding          PT Short Term Goals - 10/20/14 1503  PT SHORT TERM GOAL #1   Title Pt will be I with initial HEP   Time 3   Period Weeks   Status New   PT SHORT TERM GOAL #2   Title Pt will be able to sit for 30 min at a time and report less pain in feet (15-25% less)   Time 3   Period Weeks   Status New           PT Long Term Goals - 10/20/14 1503    PT LONG TERM GOAL #1   Title Pt wil be I with advanced HEP   Time 6   Period Weeks   Status New   PT LONG TERM GOAL #2   Title Pt will report pain in AM reduced to minimal when first stepping onto floor   Time 6   Period Weeks   Status New   PT LONG TERM GOAL #3   Title Pt will maintain (single limb) standing balance for min dynamic activities for 30 sec   Time 6   Period Weeks   Status New   PT LONG TERM GOAL #4   Title Pt will be able to walk for 20 min and report min discomfort in feet.    Time 6   Period Weeks   Status New   PT LONG TERM GOAL #5   Title Pt will be able to stand/sit at work as needed with min pain overall in  feet at the end of the day.    Time 6   Period Weeks   Status New               Plan - 11/01/14 1734    Clinical Impression Statement Pt would like to reduce to 1 x per week. She plans to perform HEP consistently. Pt reports decreased right heel pain after manual today   PT Next Visit Plan review exercises, assss benefit of manual, continue manual consider tape/ionto as needed        Problem List Patient Active Problem List   Diagnosis Date Noted  . Bilateral carpal tunnel syndrome 03/11/2014  . Numbness in both legs 03/11/2014  . Hyperlipidemia   . Thyroid disease   . Arthritis   . Diabetes mellitus without complication   . Hypertension   . GERD (gastroesophageal reflux disease)   . Depression     Dorene Ar, [place] 11/01/2014, 5:36 PM  [place], [place], [address] Phone: [phone]   Fax:  [phone]

## 2014-11-01 NOTE — Patient Instructions (Signed)
Calf Stretch (Soleus)   Gently bend knees slightly and move one foot back. Lean into wall so that stretch is felt in back lower calf. Hold __30__ seconds. Repeat with other leg back. Repeat ___3_ times. Do _2___ sessions per day.  http://gt2.exer.us/418   Copyright  VHI. All rights reserved.    Heel Raise: Bilateral (Standing)   Rise on balls of feet. Repeat __10-20__ times per set. Do ___1-2_ sets per session. Do __2__ sessions per day.

## 2014-11-03 ENCOUNTER — Encounter: Payer: Self-pay | Admitting: Physician Assistant

## 2014-11-04 ENCOUNTER — Encounter: Payer: 59 | Admitting: Physical Therapy

## 2014-11-08 ENCOUNTER — Encounter: Payer: 59 | Admitting: Physical Therapy

## 2014-11-11 ENCOUNTER — Ambulatory Visit: Payer: 59 | Admitting: Physical Therapy

## 2014-11-11 ENCOUNTER — Other Ambulatory Visit: Payer: Self-pay | Admitting: Physician Assistant

## 2014-11-11 ENCOUNTER — Encounter: Payer: Self-pay | Admitting: Physician Assistant

## 2014-11-11 DIAGNOSIS — M79671 Pain in right foot: Secondary | ICD-10-CM

## 2014-11-11 DIAGNOSIS — M79672 Pain in left foot: Principal | ICD-10-CM

## 2014-11-11 MED ORDER — AMPHETAMINE-DEXTROAMPHETAMINE 10 MG PO TABS: 10.0000 mg | ORAL_TABLET | Freq: Three times a day (TID) | ORAL | Status: DC | PRN

## 2014-11-11 NOTE — Therapy (Addendum)
[place], [place], [address] Phone: [phone]   Fax:  [phone]  Physical Therapy Treatment/Discharge   Patient Details  Name: Christine Norman MRN: [id number] Date of Birth: 1961-04-16 Referring Provider:  Unk Pinto, MD  Encounter Date: 11/11/2014      PT End of Session - 11/11/14 1747    Visit Number 3   Number of Visits 12   Date for PT Re-Evaluation 12/01/14   PT Start Time 3419   PT Stop Time 1720   PT Time Calculation (min) 43 min   Activity Tolerance Patient tolerated treatment well;Patient limited by pain      Past Medical History  Diagnosis Date  . Clotting disorder     history of 2 blood clots  . Hyperlipidemia   . Thyroid disease     hypothyroidism  . Arthritis     ankles and back  . DVT (deep venous thrombosis), right 2000    rt knee-ankle  . Back pain   . Atypical ductal hyperplasia of breast 01/18/2012    Surgical Biopsy on 01/29/2012 showed a complex sclerosing lesion including sclerosing adenosis, stromal fibrosis, and cysts.   . Diabetes mellitus without complication   . Hypertension   . GERD (gastroesophageal reflux disease)     IBS  . Depression     Past Surgical History  Procedure Laterality Date  . Knee arthroscopy  2000    rt  . Cholecystectomy  2001    lap choli  . Hernia repair  03    rt ing hernia  . Hammer toe surgery  1997    rt and lt  . Fracture surgery  2001    orif rt fif with knee surg  . Breast biopsy  01/29/2012    Procedure: BREAST BIOPSY WITH NEEDLE LOCALIZATION;  Surgeon: Haywood Lasso, MD;  Location: [facility];  Service: General;  Laterality: Left;    There were no vitals filed for this visit.  Visit Diagnosis:  Foot pain, bilateral      Subjective Assessment - 11/11/14 1736    Symptoms Arch feels a little  better,  Al ittle stronger,  Heel pain unchanged has increased pain in foot.  Not wearing orthotics today..  cramp anterior foot into great toe that adducts toe.  Lt foot aqlways swells more.                       [facility] Adult PT Treatment/Exercise - 11/11/14 1600    Knee/Hip Exercises: Stretches   Gastroc Stretch 3 reps;30 seconds   Soleus Stretch 3 reps;30 seconds   Manual Therapy   Manual Therapy --  retrograde Rt, able to decrease bump lower leg at achi   Massage Calcaneal stiff IR/ER  painful ER   Ankle Exercises: Stretches   Other Stretch plantar fascia stretches. 3 reps 30 seconds                PT Education - 11/11/14 1745    Education provided Yes   Education Details edema control education, plantar fascia stretches.   Person(s) Educated Patient   Methods Explanation;Demonstration   Comprehension Verbalized understanding;Returned demonstration          PT Short Term Goals - 11/11/14 1754    PT SHORT TERM GOAL #1   Title Pt will be I with initial HEP   Time 3   Period Weeks   Status Achieved   PT SHORT TERM  GOAL #2   Title Pt will be able to sit for 30 min at a time and report less pain in feet (15-25% less)   Time 3   Period Weeks   Status On-going           PT Long Term Goals - 11/11/14 1754    PT LONG TERM GOAL #1   Title Pt wil be I with advanced HEP   Time 6   Period Weeks   Status On-going   PT LONG TERM GOAL #2   Title Pt will report pain in AM reduced to minimal when first stepping onto floor   Time 6   Period Weeks   Status On-going   PT LONG TERM GOAL #3   Title Pt will maintain (single limb) standing balance for min dynamic activities for 30 sec   Time 6   Status Unable to assess   PT LONG TERM GOAL #4   Title Pt will be able to walk for 20 min and report min discomfort in feet.    Time 6   Period Weeks   Status On-going   PT LONG TERM GOAL #5   Title Pt will be able to stand/sit at work as needed with min pain overall in feet at the end of the day.    Time 6   Period Weeks   Status On-going                Plan - 11/11/14 1747    Clinical Impression Statement Pain not yet improved (2) treatments. Cramps increasing ? cause.  Manual?  Ionto order not back from MD yet.  Able to work toward home exercise goals.   PT Next Visit Plan review exercises,  Calf soleus plantar), / see if MD has signed ionto order yet.   Consulted and Agree with Plan of Care Patient        Problem List Patient Active Problem List   Diagnosis Date Noted  . Bilateral carpal tunnel syndrome 03/11/2014  . Numbness in both legs 03/11/2014  . Hyperlipidemia   . Thyroid disease   . Arthritis   . Diabetes mellitus without complication   . Hypertension   . GERD (gastroesophageal reflux disease)   . Depression    Melvenia Needles, PTA 11/11/2014 5:56 PM Phone: [phone] Fax: [phone]  [facility] 11/11/2014, 5:56 PM  [facility] [address] [place], [place], [address] Phone: [phone]   Fax:  [phone]   PHYSICAL THERAPY DISCHARGE SUMMARY  Visits from Start of Care: 3  Current functional level related to goals / functional outcomes: Unknown did not return, see goals.    Remaining deficits: Unknown   Education / Equipment: HEP, stretching, anatomy  Plan: Patient agrees to discharge.  Patient goals were not met. Patient is being discharged due to not returning since the last visit.  ?????    Raeford Razor, PT 07/25/16 2:25 PM Phone: [phone] Fax: [phone]

## 2014-11-17 ENCOUNTER — Encounter: Payer: 59 | Admitting: Physical Therapy

## 2014-11-19 ENCOUNTER — Encounter: Payer: Self-pay | Admitting: Family Medicine

## 2014-11-19 ENCOUNTER — Ambulatory Visit (INDEPENDENT_AMBULATORY_CARE_PROVIDER_SITE_OTHER): Payer: 59 | Admitting: Family Medicine

## 2014-11-19 VITALS — BP 124/70 | HR 69 | Temp 98.3°F | Ht 64.0 in | Wt 192.2 lb

## 2014-11-19 DIAGNOSIS — E079 Disorder of thyroid, unspecified: Secondary | ICD-10-CM

## 2014-11-19 DIAGNOSIS — Z8639 Personal history of other endocrine, nutritional and metabolic disease: Secondary | ICD-10-CM

## 2014-11-19 DIAGNOSIS — E039 Hypothyroidism, unspecified: Secondary | ICD-10-CM | POA: Diagnosis not present

## 2014-11-19 DIAGNOSIS — R739 Hyperglycemia, unspecified: Secondary | ICD-10-CM | POA: Diagnosis not present

## 2014-11-19 DIAGNOSIS — M1990 Unspecified osteoarthritis, unspecified site: Secondary | ICD-10-CM

## 2014-11-19 NOTE — Patient Instructions (Signed)
Go to the lab on the way out.  We'll contact you with your lab report. Don't change your meds for now.   Glad to see you.  I'll check your records in the meantime.

## 2014-11-19 NOTE — Progress Notes (Signed)
Pre visit review using our clinic review tool, if applicable. No additional management support is needed unless otherwise documented below in the visit note.  New patient.  Possible DMs, h/o DM2 noted, not on meds.  Last A1c controlled.  Due for f/u labs.    H/o concentration troubles, esp after menopause.  No prev testing per patient.  Was started on adderall with relief of sx.  No ADE on meds.   Hypothyroidism.  Compliant with med.  On replacement.  Due for f/u labs.  No neck mass.  Some fatigue.   Back pain.  Had seen Dr. Maryjean Ka, prev with injections and some relief of back pain.  No plan for further injection or surgery now.    PMH and SH reviewed  ROS: See HPI, otherwise noncontributory.  Meds, vitals, and allergies reviewed.   GEN: nad, alert and oriented HEENT: mucous membranes moist NECK: supple w/o LA CV: rrr.  no murmur PULM: ctab, no inc wob ABD: soft, +bs EXT: no edema SKIN: no acute rash

## 2014-11-20 LAB — HEMOGLOBIN A1C
HEMOGLOBIN A1C: 5.5 % (ref <5.7)
Mean Plasma Glucose: 111 mg/dL (ref <117)

## 2014-11-20 LAB — TSH: TSH: 1.292 u[IU]/mL (ref 0.350–4.500)

## 2014-11-21 ENCOUNTER — Encounter: Payer: Self-pay | Admitting: Family Medicine

## 2014-11-21 NOTE — Addendum Note (Signed)
Addended by: Tonia Ghent on: 11/21/2014 10:17 PM   Modules accepted: Level of Service

## 2014-11-21 NOTE — Assessment & Plan Note (Signed)
Not DM2 now by A1c.  D/w pt at [facility] that this was going to be likely.  See notes on labs.

## 2014-11-21 NOTE — Assessment & Plan Note (Addendum)
Recheck TSH today.  See notes on labs.  No TMG on exam.  >30 minutes spent in face to face time with patient, >50% spent in counselling or coordination of care.

## 2014-11-22 ENCOUNTER — Encounter: Payer: Self-pay | Admitting: *Deleted

## 2014-12-16 ENCOUNTER — Telehealth: Payer: Self-pay | Admitting: Family Medicine

## 2014-12-16 DIAGNOSIS — M546 Pain in thoracic spine: Secondary | ICD-10-CM

## 2014-12-16 NOTE — Telephone Encounter (Signed)
Called patient yesterday, late entry.   I checked back on old records.   She had noted trouble with concentration prev but had been able nearly to wean off adderall in the meantime.  She'll continue as is for now, she may be able to get off med entirely.   Prev old imaging noted, MRI C spine and L spine okay, but still with upper thoracic back pain in the midline.  She'll have flares of pain so bad as to nearly vomit.  Some better with stretching.  At this point, reasonable to get T spine MRI for completeness.  She agrees.    I turned off DM2 reminders since her A1c was so low.   She looks to be due for f/u colonoscopy 2016 and she'll call about that.  She'll update [place] if needing a referral.  She agrees.

## 2014-12-27 ENCOUNTER — Telehealth: Payer: Self-pay

## 2014-12-27 NOTE — Telephone Encounter (Signed)
Received fax from [place] that patient was scheduled was for a MRI. We have not seen nor scheduled for patient therefore pulled her account up and  noticed as of 11-19-14 patient had a new office visit at [facility] at [facility]. I called patient and verified that yes she has changed primary care doctors. I advised her that I would let the proper staff know in our office and her chart would be closed. Advised she would get a letter in mail.

## 2014-12-29 ENCOUNTER — Ambulatory Visit
Admission: RE | Admit: 2014-12-29 | Discharge: 2014-12-29 | Disposition: A | Discharge status: Home / Self Care | Payer: 59 | Source: Ambulatory Visit | Attending: Family Medicine | Admitting: Family Medicine

## 2014-12-29 DIAGNOSIS — M546 Pain in thoracic spine: Secondary | ICD-10-CM

## 2015-01-19 ENCOUNTER — Other Ambulatory Visit: Payer: Self-pay | Admitting: *Deleted

## 2015-01-19 DIAGNOSIS — G4700 Insomnia, unspecified: Secondary | ICD-10-CM

## 2015-01-19 MED ORDER — LEVOTHYROXINE SODIUM 100 MCG PO TABS: 100.0000 ug | ORAL_TABLET | Freq: Every day | ORAL | Status: DC

## 2015-01-19 MED ORDER — TEMAZEPAM 15 MG PO CAPS: 15.0000 mg | ORAL_CAPSULE | Freq: Every evening | ORAL | Status: DC | PRN

## 2015-01-19 NOTE — Telephone Encounter (Signed)
Rx faxed to [facility] as instructed.

## 2015-01-19 NOTE — Telephone Encounter (Signed)
Received faxed refill request from pharmacy for Temazepam. Last refill 07/08/14 #90/3, last office visit 11/19/14. Is it okay to refill medication?

## 2015-01-19 NOTE — Telephone Encounter (Signed)
Printed, please fax in.  Thanks.  

## 2015-02-07 ENCOUNTER — Ambulatory Visit (INDEPENDENT_AMBULATORY_CARE_PROVIDER_SITE_OTHER): Payer: 59 | Admitting: Family Medicine

## 2015-02-07 ENCOUNTER — Encounter: Payer: Self-pay | Admitting: Family Medicine

## 2015-02-07 VITALS — BP 108/70 | HR 88 | Temp 98.7°F | Ht 63.5 in | Wt 193.2 lb

## 2015-02-07 DIAGNOSIS — J029 Acute pharyngitis, unspecified: Secondary | ICD-10-CM | POA: Diagnosis not present

## 2015-02-07 DIAGNOSIS — J039 Acute tonsillitis, unspecified: Secondary | ICD-10-CM

## 2015-02-07 LAB — POCT RAPID STREP A (OFFICE): Rapid Strep A Screen: NEGATIVE

## 2015-02-07 MED ORDER — AMOXICILLIN 875 MG PO TABS: 875.0000 mg | ORAL_TABLET | Freq: Two times a day (BID) | ORAL | Status: DC

## 2015-02-07 NOTE — Progress Notes (Signed)
Pre visit review using our clinic review tool, if applicable. No additional management support is needed unless otherwise documented below in the visit note. 

## 2015-02-07 NOTE — Progress Notes (Signed)
Dr. Frederico Hamman T. Mary-Ann Pennella, MD, [facility] [place], [address] Phone: [phone] Fax: [phone]  02/07/2015  Patient: Christine Norman, MRN: [id number], DOB: 1961/01/12, 54 y.o.  Primary Physician:  Elsie Stain, MD  Chief Complaint: Sore Throat  Subjective:   Christine Norman is a 54 y.o. very pleasant female patient who presents with the following:  Has been having some sore nose and sneezing up blood. Aching in every joint and ST. Hard to swallow saliva. No sick contacts.  She has been sits coughing and sneezing up blood for about a month, but now in the last 2 or 3 day she started feeling much worse. Her throat is very sore, she is aching and hurting all over, and she is also having headache. It is hurting her and hard even swallow some saliva.  Past Medical History, Surgical History, Social History, Family History, Problem List, Medications, and Allergies have been reviewed and updated if relevant.  Patient Active Problem List   Diagnosis Date Noted  . Bilateral carpal tunnel syndrome 03/11/2014  . Numbness in both legs 03/11/2014  . Hyperlipidemia   . Thyroid disease   . Arthritis   . History of hyperglycemia   . Hypertension   . GERD (gastroesophageal reflux disease)   . Depression     Past Medical History  Diagnosis Date  . Clotting disorder     history of 2 blood clots  . Hyperlipidemia   . Thyroid disease     hypothyroidism  . DVT (deep venous thrombosis), right 2000    rt knee-ankle  . Back pain   . Atypical ductal hyperplasia of breast 01/18/2012    Surgical Biopsy on 01/29/2012 showed a complex sclerosing lesion including sclerosing adenosis, stromal fibrosis, and cysts.   . Diabetes mellitus without complication   . Hypertension   . GERD (gastroesophageal reflux disease)     IBS  . Depression   . Arthritis     ankles and back, s/p epidural injections    Past Surgical History  Procedure Laterality  Date  . Knee arthroscopy  2000    rt  . Cholecystectomy  2001    lap choli  . Hernia repair  03    rt ing hernia  . Hammer toe surgery  1997    rt and lt  . Fracture surgery  2001    orif rt fif with knee surg  . Breast biopsy  01/29/2012    Procedure: BREAST BIOPSY WITH NEEDLE LOCALIZATION;  Surgeon: Haywood Lasso, MD;  Location: [facility];  Service: General;  Laterality: Left;    History   Social History  . Marital Status: Single    Spouse Name: N/A  . Number of Children: N/A  . Years of Education: N/A   Occupational History  . Not on file.   Social History Main Topics  . Smoking status: Former Smoker -- 1.00 packs/day for 20 years    Quit date: 03/20/2010  . Smokeless tobacco: Never Used  . Alcohol Use: 0.0 oz/week    0 Standard drinks or equivalent per week     Comment: one a month  . Drug Use: No  . Sexual Activity: No   Other Topics Concern  . Not on file   Social History Narrative   Divorced 2008   No kids   [place] fan   Works at [employer]    Family History  Problem Relation Age of Onset  .  Heart disease Maternal Grandmother   . Heart disease Maternal Grandfather   . Arthritis Mother   . Diabetes Mother   . Heart failure Father   . Atrial fibrillation Father     Allergies  Allergen Reactions  . Celebrex [Celecoxib] Hives  . Etodolac     SOB/pain    Medication list reviewed and updated in full in [facility].  ROS: GEN: Acute illness details above GI: Tolerating PO intake GU: maintaining adequate hydration and urination Pulm: No SOB Interactive and getting along well at home.  Otherwise, ROS is as per the HPI.   Objective:   BP 108/70 mmHg  Pulse 88  Temp(Src) 98.7 F (37.1 C) (Oral)  Ht 5' 3.5" (1.613 m)  Wt 193 lb 4 oz (87.658 kg)  BMI 33.69 kg/m2   Gen: WDWN, NAD; A & O x3, cooperative. Pleasant.Globally Non-toxic HEENT: Normocephalic and atraumatic. Throat: swollen tonsills very large with exudate R TM  clear, L TM - good landmarks, No fluid present. rhinnorhea. No frontal or maxillary sinus T. MMM NECK: Anterior cervical  LAD is present - TTP diffusely CV: RRR, No M/G/R, cap refill <2 sec PULM: Breathing comfortably in no respiratory distress. no wheezing, crackles, rhonchi ABD: S,NT,ND,+BS. No HSM. No rebound. EXT: No c/c/e PSYCH: Friendly, good eye contact    Laboratory and Imaging Data: Results for orders placed or performed in visit on 02/07/15  Rapid Strep A  Result Value Ref Range   Rapid Strep A Screen Negative Negative     Assessment and Plan:   Exudative tonsillitis  Sore throat - Plan: Rapid Strep A, Epstein-Barr virus VCA antibody panel  Extensive exudate and swollen tonsils. Rapid strep is negative, given clinical picture, I will place the patient on some amoxicillin. Also check the patient for potential mono acutely.  Follow-up: prn  New Prescriptions   AMOXICILLIN (AMOXIL) 875 MG TABLET    Take 1 tablet (875 mg total) by mouth 2 (two) times daily.   Orders Placed This Encounter  Procedures  . Epstein-Barr virus VCA antibody panel  . Rapid Strep A    Signed,  Council Munguia T. Kao Conry, MD   Patient's Medications  New Prescriptions   AMOXICILLIN (AMOXIL) 875 MG TABLET    Take 1 tablet (875 mg total) by mouth 2 (two) times daily.  Previous Medications   AMPHETAMINE-DEXTROAMPHETAMINE (ADDERALL) 10 MG TABLET    Take 1 tablet (10 mg total) by mouth 3 (three) times daily as needed.   CASCARA SAGRADA 450 MG CAPS    390 mg  Take 1 tablet with each meal as needed.   CHOLECALCIFEROL (VITAMIN D) 1000 UNITS TABLET    Take 1,000 Units by mouth daily.   DICLOFENAC (VOLTAREN) 75 MG EC TABLET    BID times 48H.   LEVOTHYROXINE (SYNTHROID, LEVOTHROID) 100 MCG TABLET    Take 1 tablet (100 mcg total) by mouth daily.   METHOCARBAMOL (ROBAXIN) 500 MG TABLET    Take 500 mg by mouth 3 (three) times daily.   ROSUVASTATIN (CRESTOR) 10 MG TABLET    Take 1 tablet (10 mg total) by mouth  daily.   TAPENTADOL HCL 75 MG TABS    Take 1 tablet by mouth every 6 (six) hours.   TEMAZEPAM (RESTORIL) 15 MG CAPSULE    Take 1 capsule (15 mg total) by mouth at bedtime as needed for sleep.   TOPIRAMATE (TOPAMAX) 25 MG TABLET    Take 25 mg by mouth. Take 2 tablets twice daily  Modified Medications   No medications on file  Discontinued Medications   No medications on file

## 2015-02-08 ENCOUNTER — Other Ambulatory Visit (HOSPITAL_COMMUNITY): Payer: Self-pay | Admitting: Orthopaedic Surgery

## 2015-02-08 DIAGNOSIS — M25571 Pain in right ankle and joints of right foot: Secondary | ICD-10-CM

## 2015-02-08 LAB — EPSTEIN-BARR VIRUS VCA ANTIBODY PANEL
EBV EA IGG: 22.4 U/mL — AB (ref <9.0)
EBV NA IgG: 599 U/mL — ABNORMAL HIGH (ref <18.0)
EBV VCA IgG: 197 U/mL — ABNORMAL HIGH (ref <18.0)

## 2015-02-10 ENCOUNTER — Telehealth: Payer: Self-pay

## 2015-02-10 NOTE — Telephone Encounter (Signed)
She had a normal strep test. And a normal mono test.   Rapid strep test is 80% accurate. It is entirely likely that she has something that is viral in origin that will not improve with antibiotics and will only improve with time. I gave her the amox on the 20% chance that the strep test was wrong to be safe.   I would continue with supportive care.

## 2015-02-10 NOTE — Telephone Encounter (Signed)
Patient advised.  Recommended saline gargles and ibuprofen prn throat pain/swelling.  She will call back if symptoms worsen or do not improve.

## 2015-02-10 NOTE — Telephone Encounter (Signed)
Pt left v/m;pt seen 02/07/15 and has been taking amoxicillin as instructed. Pt continues with raw S/T with white on tonsils and continues with rt earache. Pt wants to know if needs more time or should pt have med added to get symptoms improved since today will the 4th day of taking amoxicillin.pt request cb. [employer].

## 2015-02-17 ENCOUNTER — Ambulatory Visit (HOSPITAL_COMMUNITY): Admission: RE | Admit: 2015-02-17 | Payer: 59 | Source: Ambulatory Visit

## 2015-02-27 ENCOUNTER — Ambulatory Visit
Admission: RE | Admit: 2015-02-27 | Discharge: 2015-02-27 | Disposition: A | Discharge status: Home / Self Care | Payer: 59 | Source: Ambulatory Visit | Attending: Orthopaedic Surgery | Admitting: Orthopaedic Surgery

## 2015-02-27 DIAGNOSIS — M25571 Pain in right ankle and joints of right foot: Secondary | ICD-10-CM

## 2015-03-01 ENCOUNTER — Encounter: Payer: Self-pay | Admitting: Family Medicine

## 2015-03-02 ENCOUNTER — Other Ambulatory Visit: Payer: Self-pay | Admitting: Family Medicine

## 2015-03-02 DIAGNOSIS — M25579 Pain in unspecified ankle and joints of unspecified foot: Secondary | ICD-10-CM

## 2015-03-15 ENCOUNTER — Telehealth: Payer: Self-pay | Admitting: Vascular Surgery

## 2015-03-15 ENCOUNTER — Other Ambulatory Visit: Payer: Self-pay

## 2015-03-15 MED ORDER — ROSUVASTATIN CALCIUM 10 MG PO TABS: 10.0000 mg | ORAL_TABLET | Freq: Every day | ORAL | Status: DC

## 2015-03-15 NOTE — Telephone Encounter (Signed)
Recvd message from [facility] at Dr Raliegh Ip office that this patient needs surgical clearance. We have not seen pt since 2009 for VV's- asked that [facility] call back to discuss. dpm

## 2015-03-17 ENCOUNTER — Other Ambulatory Visit: Payer: Self-pay | Admitting: *Deleted

## 2015-03-17 MED ORDER — ROSUVASTATIN CALCIUM 10 MG PO TABS: 10.0000 mg | ORAL_TABLET | Freq: Every day | ORAL | Status: DC

## 2015-04-06 ENCOUNTER — Encounter: Payer: Self-pay | Admitting: Vascular Surgery

## 2015-04-07 ENCOUNTER — Encounter: Payer: Self-pay | Admitting: Vascular Surgery

## 2015-04-07 ENCOUNTER — Ambulatory Visit (INDEPENDENT_AMBULATORY_CARE_PROVIDER_SITE_OTHER): Payer: 59 | Admitting: Vascular Surgery

## 2015-04-07 VITALS — BP 131/76 | HR 70 | Temp 97.7°F | Resp 16 | Ht 64.0 in | Wt 198.0 lb

## 2015-04-07 DIAGNOSIS — I83893 Varicose veins of bilateral lower extremities with other complications: Secondary | ICD-10-CM

## 2015-04-07 DIAGNOSIS — Z86718 Personal history of other venous thrombosis and embolism: Secondary | ICD-10-CM | POA: Insufficient documentation

## 2015-04-07 DIAGNOSIS — I872 Venous insufficiency (chronic) (peripheral): Secondary | ICD-10-CM | POA: Diagnosis not present

## 2015-04-07 NOTE — Progress Notes (Signed)
Referred by:  Tonia Ghent, MD [place],  [id number]  Reason for referral: surgical clearance  History of Present Illness  Christine Norman is a 54 y.o. (Apr 29, 1961) female who presents with chief complaint: surgical clearance for right plantar fascia release?.  Pt has history of DVT after a R knee meniscal procedure.  She also notes another episode of what sounds like superficial venous thrombosis.  She has been seen by Dr. Donnetta Hutching in the past for CVI.  Patient notes, onset of swelling years ago, associated with no obvious trigger.  The patient's symptoms include: swelling as the day increased, aching in calves with increased swelling.  The patient has had known history of DVT, no history of pregnancy, known history of varicose vein, no history of venous stasis ulcers, no history of  Lymphedema and known history of skin changes in lower legs.  There is known family history of venous disorders.  The patient has used compression stockings in the past with good sx relief.  Past Medical History  Diagnosis Date  . Clotting disorder     history of 2 blood clots  . Hyperlipidemia   . Thyroid disease     hypothyroidism  . DVT (deep venous thrombosis), right 2000    rt knee-ankle  . Back pain   . Atypical ductal hyperplasia of breast 01/18/2012    Surgical Biopsy on 01/29/2012 showed a complex sclerosing lesion including sclerosing adenosis, stromal fibrosis, and cysts.   . Diabetes mellitus without complication   . Hypertension   . GERD (gastroesophageal reflux disease)     IBS  . Depression   . Arthritis     ankles and back, s/p epidural injections    Past Surgical History  Procedure Laterality Date  . Knee arthroscopy  2000    rt  . Cholecystectomy  2001    lap choli  . Hernia repair  03    rt ing hernia  . Hammer toe surgery  1997    rt and lt  . Fracture surgery  2001    orif rt fif with knee surg  . Breast biopsy  01/29/2012    Procedure: BREAST BIOPSY  WITH NEEDLE LOCALIZATION;  Surgeon: Haywood Lasso, MD;  Location: [facility];  Service: General;  Laterality: Left;    Social History   Social History  . Marital Status: Single    Spouse Name: N/A  . Number of Children: N/A  . Years of Education: N/A   Occupational History  . Not on file.   Social History Main Topics  . Smoking status: Former Smoker -- 1.00 packs/day for 20 years    Quit date: 03/20/2010  . Smokeless tobacco: Never Used  . Alcohol Use: 0.0 oz/week    0 Standard drinks or equivalent per week     Comment: one a month  . Drug Use: No  . Sexual Activity: No   Other Topics Concern  . Not on file   Social History Narrative   Divorced 2008   No kids   [place] fan   Works at [employer]    Family History  Problem Relation Age of Onset  . Heart disease Maternal Grandmother   . Heart disease Maternal Grandfather   . Arthritis Mother   . Diabetes Mother   . Heart failure Father   . Atrial fibrillation Father      Current Outpatient Prescriptions on File Prior to Visit  Medication Sig Dispense Refill  .  amphetamine-dextroamphetamine (ADDERALL) 10 MG tablet Take 1 tablet (10 mg total) by mouth 3 (three) times daily as needed. 90 tablet 0  . Cascara Sagrada 450 MG CAPS 390 mg  Take 1 tablet with each meal as needed.    . cholecalciferol (VITAMIN D) 1000 UNITS tablet Take 1,000 Units by mouth daily.    . diclofenac (VOLTAREN) 75 MG EC tablet BID times 48H.    Marland Kitchen levothyroxine (SYNTHROID, LEVOTHROID) 100 MCG tablet Take 1 tablet (100 mcg total) by mouth daily. 90 tablet 1  . methocarbamol (ROBAXIN) 500 MG tablet Take 500 mg by mouth 3 (three) times daily.    . rosuvastatin (CRESTOR) 10 MG tablet Take 1 tablet (10 mg total) by mouth daily. 90 tablet 1  . temazepam (RESTORIL) 15 MG capsule Take 1 capsule (15 mg total) by mouth at bedtime as needed for sleep. 90 capsule 1  . amoxicillin (AMOXIL) 875 MG tablet Take 1 tablet (875 mg total) by mouth 2 (two)  times daily. (Patient not taking: Reported on 04/07/2015) 20 tablet 0  . Tapentadol HCl 75 MG TABS Take 1 tablet by mouth every 6 (six) hours.    . topiramate (TOPAMAX) 25 MG tablet Take 25 mg by mouth. Take 2 tablets twice daily     No current facility-administered medications on file prior to visit.    Allergies  Allergen Reactions  . Celebrex [Celecoxib] Hives  . Etodolac     SOB/pain    REVIEW OF SYSTEMS:  (Positives checked otherwise negative)  CARDIOVASCULAR:  []  chest pain, []  chest pressure, []  palpitations, []  shortness of breath when laying flat, []  shortness of breath with exertion,  [x]  pain in feet when walking, []  pain in feet when laying flat, [x]  history of blood clot in veins (DVT), [x]  history of phlebitis, []  swelling in legs, []  varicose veins  PULMONARY:  []  productive cough, []  asthma, []  wheezing  NEUROLOGIC:  []  weakness in arms or legs, []  numbness in arms or legs, []  difficulty speaking or slurred speech, []  temporary loss of vision in one eye, []  dizziness  HEMATOLOGIC:  []  bleeding problems, []  problems with blood clotting too easily  MUSCULOSKEL:  [x]  joint pain, []  joint swelling  GASTROINTEST:  []  vomiting blood, []  blood in stool     GENITOURINARY:  []  burning with urination, []  blood in urine  PSYCHIATRIC:  []  history of major depression  INTEGUMENTARY:  []  rashes, []  ulcers  CONSTITUTIONAL:  []  fever, []  chills   Physical Examination Filed Vitals:   04/07/15 1446  BP: 131/76  Pulse: 70  Temp: 97.7 F (36.5 C)  Resp: 16  Height: 5\' 4"  (1.626 m)  Weight: 198 lb (89.812 kg)  SpO2: 100%   Body mass index is 33.97 kg/(m^2).  General: A&O x 3, WD, mildly obese  Head: [place]/AT  Ear/Nose/Throat: Hearing grossly intact, nares w/o erythema or drainage, oropharynx w/o Erythema/Exudate  Eyes: PERRLA, EOMI  Neck: Supple, no nuchal rigidity, no palpable LAD  Pulmonary: Sym exp, good air movt, CTAB, no rales, rhonchi, & wheezing  Cardiac:  RRR, Nl S1, S2, no Murmurs, rubs or gallops  Vascular: Vessel Right Left  Radial Palpable Palpable  Brachial Palpable Palpable  Carotid Palpable, without bruit Palpable, without bruit  Aorta Not palpable due to pannus N/A  Femoral Palpable Palpable  Popliteal Not palpable Not palpable  PT Not Palpable Not Palpable  DP Palpable Palpable   Gastrointestinal: soft, NTND, -G/R, - HSM, - masses, - CVAT B  Musculoskeletal:  M/S 5/5 throughout , Extremities without ischemic changes , bilateral varicose veins with prominent spider veins, possible prior R calf SVT evident, cyanotic feet to resolve with elevation, no LDS  Neurologic: CN 2-12 intact , Pain and light touch intact in extremities . Motor exam as listed above  Psychiatric: Judgment intact, Mood & affect appropriate for pt's clinical situation  Dermatologic: See M/S exam for extremity exam, no rashes otherwise noted  Lymph : No Cervical, Axillary, or Inguinal lymphadenopathy    Medical Decision Making  Christine Norman is a 54 y.o. female who presents with: BLE chronic venous insufficiency (C2), sx B varicose veins   Based on the patient's history and examination, I recommend: compressive therapy.  I discussed with the patient the use of her 20-30 mm thigh high compression stockings.  Patient already has stockings.  As her sx are well managed with compression stockings, I would continue with such.    If her sx worsen, she should come back for a formal BLE venous insufficiency duplex.  In regards to her upcoming surgery, I don't see any contraindication to proceeding.  Her prior DVT is likely due to Prothrombotic state post-surgical and baseline CVI.  She should wear her compression stockings post-operatively to limit venous pooling in her legs.  Thank you for allowing [place] to participate in this patient's care.  Adele Barthel, MD [facility] Office: [phone] Pager: [phone]  04/07/2015,  3:12 PM

## 2015-04-14 ENCOUNTER — Encounter: Payer: Self-pay | Admitting: Family Medicine

## 2015-04-14 MED ORDER — ROSUVASTATIN CALCIUM 10 MG PO TABS: 10.0000 mg | ORAL_TABLET | Freq: Every day | ORAL | Status: DC

## 2015-05-03 ENCOUNTER — Encounter: Payer: Self-pay | Admitting: Internal Medicine

## 2015-06-27 ENCOUNTER — Ambulatory Visit (AMBULATORY_SURGERY_CENTER): Payer: Self-pay | Admitting: *Deleted

## 2015-06-27 VITALS — Ht 63.5 in | Wt 204.4 lb

## 2015-06-27 DIAGNOSIS — Z1211 Encounter for screening for malignant neoplasm of colon: Secondary | ICD-10-CM

## 2015-06-27 NOTE — Progress Notes (Signed)
No egg or soy allergy No issues with past sedation No diet pills No home 02 use  emmi declined Pt states she has constipation issues daily. She takes an OTC herbal supplement that occ helps and other times not.  Stools are still hard at times. Will do Dr Celesta Aver 2 day prep as per protocol due to this issue

## 2015-07-01 ENCOUNTER — Encounter: Payer: Self-pay | Admitting: Internal Medicine

## 2015-07-11 ENCOUNTER — Ambulatory Visit (AMBULATORY_SURGERY_CENTER): Payer: 59 | Admitting: Internal Medicine

## 2015-07-11 ENCOUNTER — Telehealth: Payer: Self-pay | Admitting: *Deleted

## 2015-07-11 ENCOUNTER — Encounter: Payer: Self-pay | Admitting: Internal Medicine

## 2015-07-11 ENCOUNTER — Encounter: Payer: Self-pay | Admitting: Family Medicine

## 2015-07-11 VITALS — BP 137/54 | HR 57 | Temp 97.5°F | Resp 19 | Ht 63.0 in | Wt 204.0 lb

## 2015-07-11 DIAGNOSIS — Z1211 Encounter for screening for malignant neoplasm of colon: Secondary | ICD-10-CM | POA: Diagnosis not present

## 2015-07-11 MED ORDER — SODIUM CHLORIDE 0.9 % IV SOLN: 500.0000 mL | INTRAVENOUS | Status: DC

## 2015-07-11 NOTE — Telephone Encounter (Signed)
Is this the same as Temazepam?

## 2015-07-11 NOTE — Op Note (Signed)
[facility]  [address]. [place], [address]   COLONOSCOPY PROCEDURE REPORT  PATIENT: Christine Norman, Christine Norman  MR#: [id number] BIRTHDATE: Oct 18, 1960 , 77  yrs. old GENDER: female ENDOSCOPIST: Gatha Mayer, MD, [facility] PROCEDURE DATE:  07/11/2015 PROCEDURE:   Colonoscopy, screening First Screening Colonoscopy - Avg.  risk and is 50 yrs.  old or older - No.  Prior Negative Screening - Now for repeat screening. N/A  History of Adenoma - Now for follow-up colonoscopy & has been > or = to 3 yrs.  N/A  Polyps removed today? No Recommend repeat exam, <10 yrs? No ASA CLASS:   Class II INDICATIONS:Screening for colonic neoplasia and Colorectal Neoplasm Risk Assessment for this procedure is average risk. MEDICATIONS: Propofol 290 mg IV and Monitored anesthesia care  DESCRIPTION OF PROCEDURE:   After the risks benefits and alternatives of the procedure were thoroughly explained, informed consent was obtained.  The digital rectal exam revealed no abnormalities of the rectum.   The LB [id number]  endoscope was introduced through the anus and advanced to the cecum, which was identified by both the appendix and ileocecal valve. No adverse events experienced.   The quality of the prep was good.  (MiraLax was used)  The instrument was then slowly withdrawn as the colon was fully examined. Estimated blood loss is zero unless otherwise noted in this procedure report.      COLON FINDINGS: There was mild diverticulosis noted in the sigmoid colon.   Melanosis coli was found throughout the entire examined colon.   The examination was otherwise normal.   Right colon retroflexion included.  Retroflexed views revealed no abnormalities. The time to cecum = 3.6 Withdrawal time = 8.5   The scope was withdrawn and the procedure completed. COMPLICATIONS: There were no immediate complications.  ENDOSCOPIC IMPRESSION: 1.   Mild diverticulosis was noted in the sigmoid colon 2.   Melanosis coli  was found throughout the entire examined colon 3.   The examination was otherwise normal - good prep  RECOMMENDATIONS: Repeat colonoscopy 10 years.  2026  eSigned:  Gatha Mayer, MD, [facility] 07/11/2015 11:17 AM   cc: The Patient and Dr. Elveria Rising. Damita Dunnings

## 2015-07-11 NOTE — Patient Instructions (Addendum)
No polyps seen!  You do have diverticulosis (pockets) and melanosis (staining of the colon from laxatives or herbal supplements).  Next routine colonoscopy in 10 years - 2026  I appreciate the opportunity to care for you. Gatha Mayer, MD, FACG  YOU HAD AN ENDOSCOPIC PROCEDURE TODAY AT [facility]:   Refer to the procedure report that was given to you for any specific questions about what was found during the examination.  If the procedure report does not answer your questions, please call your gastroenterologist to clarify.  If you requested that your care partner not be given the details of your procedure findings, then the procedure report has been included in a sealed envelope for you to review at your convenience later.  YOU SHOULD EXPECT: Some feelings of bloating in the abdomen. Passage of more gas than usual.  Walking can help get rid of the air that was put into your GI tract during the procedure and reduce the bloating. If you had a lower endoscopy (such as a colonoscopy or flexible sigmoidoscopy) you may notice spotting of blood in your stool or on the toilet paper. If you underwent a bowel prep for your procedure, you may not have a normal bowel movement for a few days.  Please Note:  You might notice some irritation and congestion in your nose or some drainage.  This is from the oxygen used during your procedure.  There is no need for concern and it should clear up in a day or so.  SYMPTOMS TO REPORT IMMEDIATELY:   Following lower endoscopy (colonoscopy or flexible sigmoidoscopy):  Excessive amounts of blood in the stool  Significant tenderness or worsening of abdominal pains  Swelling of the abdomen that is new, acute  Fever of 100F or higher    For urgent or emergent issues, a gastroenterologist can be reached at any hour by calling [phone].   DIET: Your first meal following the procedure should be a small meal and then it is ok to  progress to your normal diet. Heavy or fried foods are harder to digest and may make you feel nauseous or bloated.  Likewise, meals heavy in dairy and vegetables can increase bloating.  Drink plenty of fluids but you should avoid alcoholic beverages for 24 hours.  ACTIVITY:  You should plan to take it easy for the rest of today and you should NOT DRIVE or use heavy machinery until tomorrow (because of the sedation medicines used during the test).    FOLLOW UP: Our staff will call the number listed on your records the next business day following your procedure to check on you and address any questions or concerns that you may have regarding the information given to you following your procedure. If we do not reach you, we will leave a message.  However, if you are feeling well and you are not experiencing any problems, there is no need to return our call.  We will assume that you have returned to your regular daily activities without incident.  If any biopsies were taken you will be contacted by phone or by letter within the next 1-3 weeks.  Please call us at [phone] if you have not heard about the biopsies in 3 weeks.    SIGNATURES/CONFIDENTIALITY: You and/or your care partner have signed paperwork which will be entered into your electronic medical record.  These signatures attest to the fact that that the information above on your After Visit Summary has  been reviewed and is understood.  Full responsibility of the confidentiality of this discharge information lies with you and/or your care-partner.   Information on diverticulosis given to you today

## 2015-07-11 NOTE — Progress Notes (Signed)
To recovery, report to [place], RN, VSS 

## 2015-07-11 NOTE — Telephone Encounter (Signed)
Your request has been sent to Dr. Damita Dunnings for approval.

## 2015-07-12 ENCOUNTER — Telehealth: Payer: Self-pay | Admitting: *Deleted

## 2015-07-12 NOTE — Telephone Encounter (Signed)
No refill needed.  Note signed.

## 2015-07-12 NOTE — Telephone Encounter (Signed)
Sorry, I was confused for a moment.

## 2015-07-12 NOTE — Telephone Encounter (Signed)
  Follow up Call-  Call back number 07/11/2015 07/11/2015  Post procedure Call Back phone  # [phone] -  Permission to leave phone message Yes Yes     Patient questions:  Do you have a fever, pain , or abdominal swelling? No. Pain Score  0 *  Have you tolerated food without any problems? Yes.    Have you been able to return to your normal activities? Yes.    Do you have any questions about your discharge instructions: Diet   No. Medications  No. Follow up visit  No.  Do you have questions or concerns about your Care? No.  Actions: * If pain score is 4 or above: No action needed, pain <4.

## 2015-07-12 NOTE — Telephone Encounter (Signed)
Which med? Restoril?  Let me know.

## 2015-07-19 ENCOUNTER — Other Ambulatory Visit: Payer: Self-pay | Admitting: Dermatology

## 2015-07-19 ENCOUNTER — Telehealth: Payer: Self-pay | Admitting: *Deleted

## 2015-07-19 DIAGNOSIS — G4700 Insomnia, unspecified: Secondary | ICD-10-CM

## 2015-07-19 NOTE — Telephone Encounter (Signed)
Faxed refill request. Last Filled:    90 capsule 1 01/19/2015  Please advise.

## 2015-07-20 ENCOUNTER — Encounter: Payer: Self-pay | Admitting: *Deleted

## 2015-07-20 DIAGNOSIS — G4700 Insomnia, unspecified: Secondary | ICD-10-CM | POA: Insufficient documentation

## 2015-07-20 MED ORDER — TEMAZEPAM 15 MG PO CAPS: 15.0000 mg | ORAL_CAPSULE | Freq: Every evening | ORAL | Status: DC | PRN

## 2015-07-20 NOTE — Telephone Encounter (Signed)
Rx faxed as instructed to pharmacy.

## 2015-07-20 NOTE — Telephone Encounter (Signed)
Printed.  Please fax in. Thanks.  

## 2015-07-21 NOTE — Telephone Encounter (Signed)
Pt let v/m requesting status of temazepam refill. Left v/m requesting cb.

## 2015-07-21 NOTE — Telephone Encounter (Signed)
Pt called back and notified refill faxed on 07/20/15 at 3:09 pm. Pt voiced understanding.

## 2015-08-21 HISTORY — PX: TEAR DUCT PROBING: SHX793

## 2015-11-02 ENCOUNTER — Encounter: Payer: Self-pay | Admitting: Family Medicine

## 2015-11-02 ENCOUNTER — Ambulatory Visit (INDEPENDENT_AMBULATORY_CARE_PROVIDER_SITE_OTHER): Payer: 59 | Admitting: Family Medicine

## 2015-11-02 VITALS — BP 122/78 | HR 68 | Temp 98.6°F | Wt 199.0 lb

## 2015-11-02 DIAGNOSIS — R5383 Other fatigue: Secondary | ICD-10-CM | POA: Diagnosis not present

## 2015-11-02 DIAGNOSIS — F32A Depression, unspecified: Secondary | ICD-10-CM

## 2015-11-02 DIAGNOSIS — R1011 Right upper quadrant pain: Secondary | ICD-10-CM

## 2015-11-02 DIAGNOSIS — F329 Major depressive disorder, single episode, unspecified: Secondary | ICD-10-CM

## 2015-11-02 NOTE — Progress Notes (Signed)
Pre visit review using our clinic review tool, if applicable. No additional management support is needed unless otherwise documented below in the visit note.  Constipation for years.  Worse sx recently.  More abd pain recently, about 10 days ago.  Better now.  Still with some discomfort, esp after meals.  RUQ pain down to the umbilicus.  Prev was in the upper midline abd.  S/p cholecystectomy.  This was worse pain than usual with prev constipation. Unclear to patient why her pain better in the meantime.  No vomiting.  No nausea.  No blood in stool.  No fevers, no chills.  No jaundice.    R knee pain.  H/o cortisone shots in the past.  She can't get through the grocery store w/o pain.  Worse over the last few months.  She is going to f/u with ortho.   She is still having trouble losing weight.  She is on a weight watchers diet.  Fatigue.  Work strain is noted.  She doesn't want to come out of work but is considering asking for a leave.  She has been tearful coming home from work. She has trouble concentrating, tearfulness.  Prev treated for depression, many years ago, on unrecalled med at the time.  No SI/HI.   Meds, vitals, and allergies reviewed.   ROS: See HPI.  Otherwise, noncontributory.  GEN: nad, alert and oriented, tearful, judgement intact HEENT: mucous membranes moist NECK: supple w/o LA CV: rrr.  PULM: ctab, no inc wob ABD: soft, +bs, no ttp EXT: no edema SKIN: no acute rash Lipomas noted on the R thigh and L upper arm

## 2015-11-02 NOTE — Patient Instructions (Addendum)
Ask Lorin Mercy about your knee and see what he says.   Go to the lab on the way out.  We'll contact you with your lab report. Consider treatment for anxiety and a time out of work.  Let me know if you want to proceed.  I think both are reasonable.  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  Take care.  Glad to see you.

## 2015-11-03 DIAGNOSIS — R1011 Right upper quadrant pain: Secondary | ICD-10-CM | POA: Insufficient documentation

## 2015-11-03 LAB — CBC WITH DIFFERENTIAL/PLATELET
BASOS ABS: 0.1 10*3/uL (ref 0.0–0.1)
BASOS PCT: 0.8 % (ref 0.0–3.0)
EOS ABS: 0.2 10*3/uL (ref 0.0–0.7)
Eosinophils Relative: 2.8 % (ref 0.0–5.0)
HCT: 39.9 % (ref 36.0–46.0)
HEMOGLOBIN: 13.2 g/dL (ref 12.0–15.0)
Lymphocytes Relative: 24.7 % (ref 12.0–46.0)
Lymphs Abs: 1.9 10*3/uL (ref 0.7–4.0)
MCHC: 33.1 g/dL (ref 30.0–36.0)
MCV: 83.4 fl (ref 78.0–100.0)
MONO ABS: 0.4 10*3/uL (ref 0.1–1.0)
Monocytes Relative: 5.6 % (ref 3.0–12.0)
NEUTROS ABS: 5.2 10*3/uL (ref 1.4–7.7)
NEUTROS PCT: 66.1 % (ref 43.0–77.0)
Platelets: 265 10*3/uL (ref 150.0–400.0)
RBC: 4.78 Mil/uL (ref 3.87–5.11)
RDW: 14.7 % (ref 11.5–15.5)
WBC: 7.8 10*3/uL (ref 4.0–10.5)

## 2015-11-03 LAB — COMPREHENSIVE METABOLIC PANEL
ALBUMIN: 4.3 g/dL (ref 3.5–5.2)
ALK PHOS: 66 U/L (ref 39–117)
ALT: 15 U/L (ref 0–35)
AST: 18 U/L (ref 0–37)
BUN: 16 mg/dL (ref 6–23)
CALCIUM: 9.6 mg/dL (ref 8.4–10.5)
CHLORIDE: 103 meq/L (ref 96–112)
CO2: 30 mEq/L (ref 19–32)
Creatinine, Ser: 0.79 mg/dL (ref 0.40–1.20)
GFR: 80.92 mL/min (ref >60.00)
Glucose, Bld: 109 mg/dL — ABNORMAL HIGH (ref 70–99)
POTASSIUM: 4.3 meq/L (ref 3.5–5.1)
SODIUM: 140 meq/L (ref 135–145)
TOTAL PROTEIN: 7.4 g/dL (ref 6.0–8.3)
Total Bilirubin: 0.3 mg/dL (ref 0.2–1.2)

## 2015-11-03 LAB — LIPASE: LIPASE: 20 U/L (ref 11.0–59.0)

## 2015-11-03 LAB — TSH: TSH: 2.1 u[IU]/mL (ref 0.35–4.50)

## 2015-11-03 NOTE — Assessment & Plan Note (Signed)
Likely, exacerbated by work stress.  Recheck tsh.  Still okay for outpatient f/u.  Encouraged patient to consider treatment and possible FMLA leave from work.  I think that w/o treatment, and only time out of work, she'll not be greatly improved.   She'll consider.   >25 minutes spent in face to face time with patient, >50% spent in counselling or coordination of care.

## 2015-11-03 NOTE — Assessment & Plan Note (Signed)
Benign exam, check basic labs today.  Unclear how much this is related to/exacerbated by her work stress. Still okay for outpatient f/u.

## 2015-11-07 ENCOUNTER — Telehealth: Payer: Self-pay | Admitting: *Deleted

## 2015-11-07 MED ORDER — ESCITALOPRAM OXALATE 10 MG PO TABS: 10.0000 mg | ORAL_TABLET | Freq: Every day | ORAL | Status: DC

## 2015-11-07 NOTE — Telephone Encounter (Signed)
Given her chronic stressors with work would start daily lexapro.  Sent.  Start with 10mg  a day, should take about 4 weeks to notice full effect.  I would like to get update from patient in about 2 weeks, ie phone call or mychart message from patient.  Contact us sooner if needed, ie if worsening mood.    Thanks.

## 2015-11-07 NOTE — Telephone Encounter (Signed)
Patient says she spoke with you at her last OV about something for anxiety.  She says she definitely needs to start something but is not sure if it should be something that is taken daily or as needed.  Please advise.

## 2015-11-08 NOTE — Telephone Encounter (Signed)
Patient advised.

## 2015-11-28 ENCOUNTER — Encounter: Payer: Self-pay | Admitting: Internal Medicine

## 2015-11-28 ENCOUNTER — Encounter: Payer: Self-pay | Admitting: Family Medicine

## 2015-11-28 ENCOUNTER — Ambulatory Visit (INDEPENDENT_AMBULATORY_CARE_PROVIDER_SITE_OTHER): Payer: BLUE CROSS/BLUE SHIELD | Admitting: Internal Medicine

## 2015-11-28 VITALS — BP 116/74 | HR 73 | Temp 98.6°F | Wt 194.5 lb

## 2015-11-28 DIAGNOSIS — J0100 Acute maxillary sinusitis, unspecified: Secondary | ICD-10-CM | POA: Diagnosis not present

## 2015-11-28 MED ORDER — METHYLPREDNISOLONE ACETATE 80 MG/ML IJ SUSP
80.0000 mg | Freq: Once | INTRAMUSCULAR | Status: AC
  Administered 2015-11-28: 80 mg via INTRAMUSCULAR

## 2015-11-28 MED ORDER — AMOXICILLIN-POT CLAVULANATE 875-125 MG PO TABS: 1.0000 | ORAL_TABLET | Freq: Two times a day (BID) | ORAL | Status: DC

## 2015-11-28 NOTE — Progress Notes (Signed)
HPI  Pt presents to the clinic to the clinic today with c/o headache, facial pain and pressure, nasal congestion, sore throat and cough. This started 1 week ago. She is blowing green mucous out of her nose. The cough is productive of green mucous. She denies fever, but has had chills and body aches. She also has associated fatigue. She takes Zyrtec daily for history of seasonal allergies. She has not had sick contacts that she is aware of.  Review of Systems    Past Medical History  Diagnosis Date  . Clotting disorder ([place])     history of 2 blood clots  . Hyperlipidemia   . Thyroid disease     hypothyroidism  . DVT (deep venous thrombosis), right 2000    rt knee-ankle  . Back pain   . Atypical ductal hyperplasia of breast 01/18/2012    Surgical Biopsy on 01/29/2012 showed a complex sclerosing lesion including sclerosing adenosis, stromal fibrosis, and cysts.   Marland Kitchen GERD (gastroesophageal reflux disease)     IBS  . Depression   . Arthritis     ankles and back, s/p epidural injections  . Hypertension     not medicated  . Allergy     seasonal  . Constipation     chronic- takes OTC med that helps at times and others not   . Insomnia     Family History  Problem Relation Age of Onset  . Heart disease Maternal Grandmother   . Heart disease Maternal Grandfather   . Arthritis Mother   . Diabetes Mother   . Heart failure Father   . Atrial fibrillation Father   . Colon polyps Father   . Colon cancer Neg Hx   . Rectal cancer Neg Hx   . Stomach cancer Neg Hx     Social History   Social History  . Marital Status: Single    Spouse Name: N/A  . Number of Children: N/A  . Years of Education: N/A   Occupational History  . Not on file.   Social History Main Topics  . Smoking status: Former Smoker -- 1.00 packs/day for 20 years    Quit date: 03/20/2010  . Smokeless tobacco: Never Used  . Alcohol Use: 0.0 oz/week    0 Standard drinks or equivalent per week     Comment: one a  month-socially  . Drug Use: No  . Sexual Activity: No   Other Topics Concern  . Not on file   Social History Narrative   Divorced 2008   No kids   [employer] fan   Works at [employer]  . Celebrex [Celecoxib] Hives  . Etodolac     SOB/pain     Constitutional: Positive headache, fatigue. Denies fever or abrupt weight changes.  HEENT:  Positive facial pain, nasal congestion and sore throat. Denies eye redness, ear pain, ringing in the ears, wax buildup, runny nose or bloody nose. Respiratory: Positive cough. Denies difficulty breathing or shortness of breath.  Cardiovascular: Denies chest pain, chest tightness, palpitations or swelling in the hands or feet.   No other specific complaints in a complete review of systems (except as listed in HPI above).  Objective:  BP 116/74 mmHg  Pulse 73  Temp(Src) 98.6 F (37 C) (Oral)  Wt 194 lb 8 oz (88.225 kg)  SpO2 97%  General: Appears her stated age, well developed, well nourished in NAD. HEENT: Head: normal shape and size, maxillary sinus tenderness noted; Eyes:  sclera white, no icterus, conjunctiva pink; Ears: Tm's gray and intact, normal light reflex; Nose: mucosa boggy and moist, septum midline; Throat/Mouth: + PND. Teeth present, mucosa erythematous and moist, no exudate noted, no lesions or ulcerations noted.  Neck:  No adenopathy noted.  Cardiovascular: Normal rate and rhythm. S1,S2 noted.  No murmur, rubs or gallops noted.  Pulmonary/Chest: Normal effort and positive vesicular breath sounds. No respiratory distress. No wheezes, rales or ronchi noted.      Assessment & Plan:   Acute Frontal Sinusitis  Can use a Neti Pot which can be purchased from your local drug store. Continue Zyrtec Flonase 2 sprays each nostril for 3 days and then as needed. Augmentin BID for 10 days 80 mg Depo IM today  RTC as needed or if symptoms persist.

## 2015-11-28 NOTE — Patient Instructions (Signed)

## 2015-11-28 NOTE — Addendum Note (Signed)
Addended by: Tammi Sou on: 11/28/2015 04:51 PM   Modules accepted: Orders

## 2015-11-28 NOTE — Progress Notes (Signed)
Pre visit review using our clinic review tool, if applicable. No additional management support is needed unless otherwise documented below in the visit note. 

## 2015-11-30 ENCOUNTER — Other Ambulatory Visit: Payer: Self-pay | Admitting: Family Medicine

## 2015-11-30 ENCOUNTER — Telehealth: Payer: Self-pay | Admitting: Family Medicine

## 2015-11-30 DIAGNOSIS — G4700 Insomnia, unspecified: Secondary | ICD-10-CM

## 2015-11-30 MED ORDER — ESCITALOPRAM OXALATE 10 MG PO TABS
10.0000 mg | ORAL_TABLET | Freq: Every day | ORAL | Status: DC
Start: 2015-11-30 — End: 2016-01-03

## 2015-11-30 MED ORDER — TEMAZEPAM 15 MG PO CAPS: 15.0000 mg | ORAL_CAPSULE | Freq: Every evening | ORAL | Status: DC | PRN

## 2015-11-30 MED ORDER — METHOCARBAMOL 500 MG PO TABS: 500.0000 mg | ORAL_TABLET | Freq: Three times a day (TID) | ORAL | Status: DC | PRN

## 2015-11-30 MED ORDER — LEVOTHYROXINE SODIUM 100 MCG PO TABS: 100.0000 ug | ORAL_TABLET | Freq: Every day | ORAL | Status: DC

## 2015-11-30 MED ORDER — ROSUVASTATIN CALCIUM 10 MG PO TABS
10.0000 mg | ORAL_TABLET | Freq: Every day | ORAL | Status: DC
Start: 2015-11-30 — End: 2017-10-11

## 2015-11-30 NOTE — Progress Notes (Signed)
Prescription faxed to [facility] as instructed.

## 2015-11-30 NOTE — Telephone Encounter (Signed)
Patient called and said she's almost out of her Synthroid.  Please call patient when medications are done.  Please call patient at [phone].

## 2015-11-30 NOTE — Telephone Encounter (Signed)
See rest of message. Thyroid med sent already.  Thanks.

## 2015-11-30 NOTE — Progress Notes (Signed)
4 rxs sent to [facility] via [facility].  I need the temazepam hard copy faxed to [facility].   Thanks.

## 2015-11-30 NOTE — Telephone Encounter (Signed)
Prescription faxed to pharmacy as instructed.

## 2016-01-03 ENCOUNTER — Ambulatory Visit (INDEPENDENT_AMBULATORY_CARE_PROVIDER_SITE_OTHER): Payer: BLUE CROSS/BLUE SHIELD | Admitting: Family Medicine

## 2016-01-03 ENCOUNTER — Encounter: Payer: Self-pay | Admitting: Family Medicine

## 2016-01-03 VITALS — BP 126/84 | HR 67 | Temp 98.2°F | Wt 201.0 lb

## 2016-01-03 DIAGNOSIS — F329 Major depressive disorder, single episode, unspecified: Secondary | ICD-10-CM

## 2016-01-03 DIAGNOSIS — F32A Depression, unspecified: Secondary | ICD-10-CM

## 2016-01-03 MED ORDER — SERTRALINE HCL 50 MG PO TABS: 50.0000 mg | ORAL_TABLET | Freq: Every day | ORAL | Status: DC

## 2016-01-03 NOTE — Patient Instructions (Signed)
Change to sertraline 50mg  and see how that helps.  You can cut back to 25mg  if needed.  100mg  is the max dose.  Don't go up to 100mg  before 4 weeks.   If the fatigue doesn't get better, then we need to consider sleep apnea testing.  Take care.  Glad to see you.

## 2016-01-03 NOTE — Progress Notes (Signed)
Pre visit review using our clinic review tool, if applicable. No additional management support is needed unless otherwise documented below in the visit note.  Lexapro clearly helped her anxiety but her fatigue got worse at 10mg .  Cut back to 5mg  about 3-4 weeks ago and her anxiety got worse in the meantime.  She still has some fatigue, even with PM dosing of med.  On the 10mg  of lexapro, she was clearly more patient, less upset.  "I could take a deep breath."   She has some early AM cramping in her legs if she stretches a lot before she gets out of bed.  "Work is on my mind 24/7".    She snores some.  She doesn't have known apnea.  Not waking gasping for air.  No AM HA.  She doesn't nap usually, but would if she could.  No high risk episodes of falling asleep.    Meds, vitals, and allergies reviewed.   ROS: Per HPI unless specifically indicated in ROS section   GEN: nad, alert and oriented HEENT: mucous membranes moist NECK: supple w/o LA CV: rrr.  no murmur PULM: ctab, no inc wob Affect wnl, speech wnl, judgement intact

## 2016-01-04 NOTE — Assessment & Plan Note (Signed)
And anxiety.  Clearly better with lexapro but had sedation that was limiting use.  Didn't get good enough effect with lower dose or PM dosing.  D/w pt.  Reasonable to try another SSRI, ie zoloft.  D/w pt.  rx sent. Start 50mg  a day, can dec to 25 or go up to 100mg  a day if needed, see AVS.  dw pt.  She agrees.  Still okay for outpatient f/u.  If fatigue continues then she may need OSA eval, she'll update me. >25 minutes spent in face to face time with patient, >50% spent in counselling or coordination of care.

## 2016-05-07 ENCOUNTER — Other Ambulatory Visit: Payer: Self-pay | Admitting: *Deleted

## 2016-05-07 DIAGNOSIS — G4700 Insomnia, unspecified: Secondary | ICD-10-CM

## 2016-05-08 ENCOUNTER — Other Ambulatory Visit: Payer: Self-pay | Admitting: *Deleted

## 2016-05-08 MED ORDER — TEMAZEPAM 15 MG PO CAPS: 15.0000 mg | ORAL_CAPSULE | Freq: Every evening | ORAL | 0 refills | Status: DC | PRN

## 2016-05-08 NOTE — Telephone Encounter (Signed)
Faxed to [facility]

## 2016-05-08 NOTE — Telephone Encounter (Signed)
Printed.  Please send in.   Needs OV this fall.  Thanks.

## 2016-08-17 ENCOUNTER — Encounter: Payer: Self-pay | Admitting: Family Medicine

## 2016-08-17 ENCOUNTER — Ambulatory Visit (INDEPENDENT_AMBULATORY_CARE_PROVIDER_SITE_OTHER): Payer: BLUE CROSS/BLUE SHIELD | Admitting: Family Medicine

## 2016-08-17 DIAGNOSIS — R059 Cough, unspecified: Secondary | ICD-10-CM

## 2016-08-17 DIAGNOSIS — R05 Cough: Secondary | ICD-10-CM

## 2016-08-17 MED ORDER — AMOXICILLIN-POT CLAVULANATE 875-125 MG PO TABS: 1.0000 | ORAL_TABLET | Freq: Two times a day (BID) | ORAL | 0 refills | Status: DC

## 2016-08-17 MED ORDER — ALBUTEROL SULFATE HFA 108 (90 BASE) MCG/ACT IN AERS
1.0000 | INHALATION_SPRAY | Freq: Four times a day (QID) | RESPIRATORY_TRACT | 0 refills | Status: DC | PRN
Start: 2016-08-17 — End: 2016-09-28

## 2016-08-17 NOTE — Patient Instructions (Signed)
When better, update me about the fatigue and we'll go from there.  Use the inhaler in the meantime and start the antibiotics.   Take care.  Glad to see you.

## 2016-08-17 NOTE — Progress Notes (Signed)
Pre visit review using our clinic review tool, if applicable. No additional management support is needed unless otherwise documented below in the visit note. 

## 2016-08-17 NOTE — Progress Notes (Signed)
duration of symptoms: about 5 days, progressively worse each day Rhinorrhea: yes Congestion: yes ear pain: yes sore throat: not sore but irritated from coughing Cough:yes, discolored thick sputum Myalgias:yes Fevers: no known fever but some chills/sweats Sick contacts noted.  chest feels tight.  She doesn't feel like she is getting a full deep breath when desired Had a flu shot prev.    Fatigue noted, ongoing.  D/w pt. She attributed it to work.   Per HPI unless specifically indicated in ROS section   Meds, vitals, and allergies reviewed.   GEN: nad, alert and oriented HEENT: mucous membranes moist, TM w/o erythema, nasal epithelium injected, OP with cobblestoning, max sinuses ttp B NECK: supple w/o LA CV: rrr. PULM: ctab except for coarse BS on the RLL, no inc wob ABD: soft, +bs EXT: no edema Cough noted

## 2016-08-20 DIAGNOSIS — R059 Cough, unspecified: Secondary | ICD-10-CM | POA: Insufficient documentation

## 2016-08-20 DIAGNOSIS — R05 Cough: Secondary | ICD-10-CM | POA: Insufficient documentation

## 2016-08-20 NOTE — Assessment & Plan Note (Addendum)
Nontoxic and no focal dec in BS but she does have asymmetric course BS noted.  D/w pt.  Would treat.  Okay for outpatient f/u. Use SABA in the meantime and start augmentin.  When better, update me about the fatigue and we'll go from there. She agrees.

## 2016-08-21 ENCOUNTER — Telehealth: Payer: Self-pay | Admitting: Family Medicine

## 2016-08-21 MED ORDER — PREDNISONE 20 MG PO TABS: 20.0000 mg | ORAL_TABLET | Freq: Every day | ORAL | 0 refills | Status: DC

## 2016-08-21 NOTE — Telephone Encounter (Signed)
Patient says she is using the inhaler: 2 puffs twice a day and it helps some at the time but not for an extended period of time.  She says her chest is tight and trying to get the phlegm up (yellow/green) has made her throat hurt.  [facility]

## 2016-08-21 NOTE — Telephone Encounter (Signed)
Please see what details you can get.  How often is she using inhaler?  Is that helping?  Thanks.

## 2016-08-21 NOTE — Telephone Encounter (Signed)
Patient advised.

## 2016-08-21 NOTE — Telephone Encounter (Signed)
Okay to use the albuterol up to 3 times a day if needed and add on prednisone for a short course- take with food.  If that isn't helping, then I want to see her back in the clinic.  rx sent.  Thanks.

## 2016-08-21 NOTE — Telephone Encounter (Signed)
Pt called because she was seen last Friday but is still feeling very bad.  She is coughing but is having a heard time getting anything up.  She is congested, but feels somewhat better.  Her chest is tight.  She thought she might need something else to break it up some.  Can you please call her cell to discuss further.  Thanks.

## 2016-09-05 ENCOUNTER — Other Ambulatory Visit: Payer: Self-pay | Admitting: Family Medicine

## 2016-09-05 ENCOUNTER — Encounter: Payer: Self-pay | Admitting: Family Medicine

## 2016-09-05 DIAGNOSIS — G4700 Insomnia, unspecified: Secondary | ICD-10-CM

## 2016-09-07 NOTE — Telephone Encounter (Signed)
Last filled 05/08/2016 #90--please advise

## 2016-09-08 MED ORDER — TEMAZEPAM 15 MG PO CAPS: 15.0000 mg | ORAL_CAPSULE | Freq: Every evening | ORAL | 1 refills | Status: DC | PRN

## 2016-09-08 NOTE — Telephone Encounter (Signed)
Printed.  Please fax in. Thanks.  

## 2016-09-10 NOTE — Telephone Encounter (Signed)
Hardcopy faxed to [facility], then placed in shred.

## 2016-09-10 NOTE — Telephone Encounter (Signed)
This was already printed to be faxed.  Thanks.

## 2016-09-27 ENCOUNTER — Other Ambulatory Visit: Payer: Self-pay | Admitting: Family Medicine

## 2016-09-28 ENCOUNTER — Ambulatory Visit (INDEPENDENT_AMBULATORY_CARE_PROVIDER_SITE_OTHER)
Admission: RE | Admit: 2016-09-28 | Discharge: 2016-09-28 | Disposition: A | Discharge status: Home / Self Care | Payer: BLUE CROSS/BLUE SHIELD | Source: Ambulatory Visit | Attending: Family Medicine | Admitting: Family Medicine

## 2016-09-28 ENCOUNTER — Ambulatory Visit (INDEPENDENT_AMBULATORY_CARE_PROVIDER_SITE_OTHER): Payer: BLUE CROSS/BLUE SHIELD | Admitting: Family Medicine

## 2016-09-28 ENCOUNTER — Encounter: Payer: Self-pay | Admitting: Family Medicine

## 2016-09-28 VITALS — BP 124/72 | HR 61 | Temp 98.1°F | Wt 222.5 lb

## 2016-09-28 DIAGNOSIS — R05 Cough: Secondary | ICD-10-CM

## 2016-09-28 DIAGNOSIS — R059 Cough, unspecified: Secondary | ICD-10-CM

## 2016-09-28 MED ORDER — AZITHROMYCIN 500 MG PO TABS: 500.0000 mg | ORAL_TABLET | Freq: Every day | ORAL | Status: DC

## 2016-09-28 MED ORDER — ALBUTEROL SULFATE HFA 108 (90 BASE) MCG/ACT IN AERS: 1.0000 | INHALATION_SPRAY | Freq: Four times a day (QID) | RESPIRATORY_TRACT | 1 refills | Status: DC | PRN

## 2016-09-28 NOTE — Progress Notes (Signed)
09/12/16- See at Dr. Berle Mull office, started on doxy and then changed to clindamycin.  Will have last dose today.   Had f/u with ENT and was recommended to come in today.   She was given rx by ENT to hold for zmax.    Cough has continued since 07/2016, still with sputum.  No fevers.  Sputum is still greenish.  Some wheeze.  Snoring more than normal.   Prev had used inhaler, but not used recently since she ran out.  D/w pt.    No vomiting, no diarrhea. She feels some better but clearly isn't back to baseline.  Fatigued.    Meds, vitals, and allergies reviewed.   ROS: Per HPI unless specifically indicated in ROS section   GEN: nad, alert and oriented HEENT: mucous membranes moist, tm w/o erythema, nasal exam w/o erythema, clear discharge noted,  OP with cobblestoning, sinuses nontender to palpation NECK: supple w/o LA CV: rrr.   PULM: ctab, no inc wob, no focal decrease in breath sounds. No wheeze.  Cough noted during exam EXT: no edema SKIN: no acute rash

## 2016-09-28 NOTE — Patient Instructions (Signed)
Go to the lab on the way out.  We'll contact you with your xray report. Finish the clindamycin today, then start zithromax tomorrow.  Update me as needed.  Take care.  Glad to see you.

## 2016-09-28 NOTE — Progress Notes (Signed)
Pre visit review using our clinic review tool, if applicable. No additional management support is needed unless otherwise documented below in the visit note. 

## 2016-09-30 NOTE — Assessment & Plan Note (Signed)
She has had ongoing symptoms for weeks now. With sputum. No fevers now. Still with discolored sputum. Sinuses are nontender. Lungs are clear. The concern is for an atypical process. Discussed with patient. Check x-ray today. Will send note and x-ray results to Dr. Ernesto Rutherford as an Juluis Rainier. She already has prescription for Zithromax. Start that in the meantime per ENT instructions. Nontoxic. Okay for outpatient follow-up. She agrees with plan. >25 minutes spent in face to face time with patient, >50% spent in counselling or coordination of care.

## 2016-10-01 ENCOUNTER — Telehealth: Payer: Self-pay | Admitting: Family Medicine

## 2016-10-01 NOTE — Telephone Encounter (Signed)
Patient returned Regina's call. °

## 2016-10-01 NOTE — Telephone Encounter (Signed)
Called patient back and test results were given to her.

## 2016-11-14 ENCOUNTER — Encounter: Payer: Self-pay | Admitting: Family Medicine

## 2016-11-14 ENCOUNTER — Ambulatory Visit (INDEPENDENT_AMBULATORY_CARE_PROVIDER_SITE_OTHER): Payer: BLUE CROSS/BLUE SHIELD | Admitting: Family Medicine

## 2016-11-14 VITALS — BP 108/58 | HR 68 | Temp 99.0°F | Resp 18 | Wt 220.0 lb

## 2016-11-14 DIAGNOSIS — B9789 Other viral agents as the cause of diseases classified elsewhere: Secondary | ICD-10-CM | POA: Diagnosis not present

## 2016-11-14 DIAGNOSIS — J069 Acute upper respiratory infection, unspecified: Secondary | ICD-10-CM | POA: Diagnosis not present

## 2016-11-14 NOTE — Progress Notes (Signed)
Pre visit review using our clinic review tool, if applicable. No additional management support is needed unless otherwise documented below in the visit note. 

## 2016-11-14 NOTE — Progress Notes (Signed)
Subjective:    Patient ID: Christine Norman, female    DOB: 04-18-1961, 56 y.o.   MRN: [id number]  HPI This is a 56 yo female, accompanied by her husband who is also sick. She has had cough x 3 days, cough is dry, a little runny nose, low grade fever and chills. Ears feel full, no sore throat. Achy all over, started yesterday. Took Dayquil without relief, acetaminophen for fever. Has taken some of husband's hycodan to sleep at night with good results. Hears wheezing when lying down, feels weak and winded. Has been feeling dizzy. Good fluid intake.   Past Medical History:  Diagnosis Date  . Allergy    seasonal  . Arthritis    ankles and back, s/p epidural injections  . 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   . Back pain   . Clotting disorder ([place])    history of 2 blood clots  . Constipation    chronic- takes OTC med that helps at times and others not   . Depression   . DVT (deep venous thrombosis), right 2000   rt knee-ankle  . GERD (gastroesophageal reflux disease)    IBS  . Hyperlipidemia   . Hypertension    not medicated  . Insomnia   . Thyroid disease    hypothyroidism   Past Surgical History:  Procedure Laterality Date  . BREAST BIOPSY  01/29/2012   Procedure: BREAST BIOPSY WITH NEEDLE LOCALIZATION;  Surgeon: Haywood Lasso, MD;  Location: [facility];  Service: General;  Laterality: Left;  . CHOLECYSTECTOMY  2001   lap choli  . COLONOSCOPY  2006   normal  . FRACTURE SURGERY  2001   orif rt fif with knee surg  . [place]   rt and lt  . HERNIA REPAIR  03   rt ing hernia  . KNEE ARTHROSCOPY  2000   rt  . TEAR DUCT PROBING Bilateral 2017   and plug placement to treat dry eye   Family History  Problem Relation Age of Onset  . Arthritis Mother   . Diabetes Mother   . Heart failure Father   . Atrial  fibrillation Father   . Colon polyps Father   . Heart disease Maternal Grandmother   . Heart disease Maternal Grandfather   . Colon cancer Neg Hx   . Rectal cancer Neg Hx   . Stomach cancer Neg Hx    Social History  Substance Use Topics  . Smoking status: Former Smoker    Packs/day: 1.00    Years: 20.00    Quit date: 03/20/2010  . Smokeless tobacco: Never Used  . Alcohol use 0.0 oz/week     Comment: one a month-socially      Review of Systems Per HPI    Objective:   Physical Exam  Constitutional: She is oriented to person, place, and time. She appears well-developed and well-nourished. No distress.  HENT:  Head: Normocephalic and atraumatic.  Right Ear: External ear normal.  Left Ear: External ear normal.  Nose: Nose normal.  Mouth/Throat: Oropharynx is clear and moist. No oropharyngeal exudate.  Eyes: Conjunctivae are normal.  Neck: Normal range of motion. Neck supple.  Cardiovascular: Normal rate, regular rhythm and normal heart sounds.   Pulmonary/Chest: Effort normal and breath sounds normal.  Lymphadenopathy:    She has no cervical adenopathy.  Neurological: She  is alert and oriented to person, place, and time.  Skin: Skin is warm and dry. She is not diaphoretic.  Psychiatric: She has a normal mood and affect. Her behavior is normal. Judgment and thought content normal.  Vitals reviewed.     BP (!) 108/58 (BP Location: Right Arm, Patient Position: Sitting, Cuff Size: Large)   Pulse 68   Temp 99 F (37.2 C) (Oral)   Resp 18   Wt 220 lb (99.8 kg)   SpO2 95%   BMI 38.97 kg/m  Wt Readings from Last 3 Encounters:  11/14/16 220 lb (99.8 kg)  09/28/16 222 lb 8 oz (100.9 kg)  08/17/16 219 lb (99.3 kg)       Assessment & Plan:  1. Viral URI with cough - Provided written and verbal information regarding diagnosis and treatment. - Patient instructions- For nasal congestion you can use Afrin nasal spray for 3 days max, Sudafed, saline nasal spray (generic is  fine for all). Take Mucinex (plain) for next 3-4 days Drink enough fluids to make your urine light yellow. For fever/chill/muscle aches you can take over the counter acetaminophen or ibuprofen.  Please come back in if you are not better in 5-7 days or if you develop wheezing, shortness of breath or persistent vomiting.  Clarene Reamer, FNP-BC  [facility] [place]  11/15/2016 8:54 PM

## 2016-11-14 NOTE — Patient Instructions (Addendum)
For nasal congestion you can use Afrin nasal spray for 3 days max, Sudafed, saline nasal spray (generic is fine for all). Take Mucinex (plain) for next 3-4 days Drink enough fluids to make your urine light yellow. For fever/chill/muscle aches you can take over the counter acetaminophen or ibuprofen.  Please come back in if you are not better in 5-7 days or if you develop wheezing, shortness of breath or persistent vomiting.    Upper Respiratory Infection, Adult Most upper respiratory infections (URIs) are a viral infection of the air passages leading to the lungs. A URI affects the nose, throat, and upper air passages. The most common type of URI is nasopharyngitis and is typically referred to as "the common cold." URIs run their course and usually go away on their own. Most of the time, a URI does not require medical attention, but sometimes a bacterial infection in the upper airways can follow a viral infection. This is called a secondary infection. Sinus and middle ear infections are common types of secondary upper respiratory infections. Bacterial pneumonia can also complicate a URI. A URI can worsen asthma and chronic obstructive pulmonary disease (COPD). Sometimes, these complications can require emergency medical care and may be life threatening. What are the causes? Almost all URIs are caused by viruses. A virus is a type of germ and can spread from one person to another. What increases the risk? You may be at risk for a URI if:  You smoke.  You have chronic heart or lung disease.  You have a weakened defense (immune) system.  You are very young or very old.  You have nasal allergies or asthma.  You work in crowded or poorly ventilated areas.  You work in health care facilities or schools. What are the signs or symptoms? Symptoms typically develop 2-3 days after you come in contact with a cold virus. Most viral URIs last 7-10 days. However, viral URIs from the influenza virus  (flu virus) can last 14-18 days and are typically more severe. Symptoms may include:  Runny or stuffy (congested) nose.  Sneezing.  Cough.  Sore throat.  Headache.  Fatigue.  Fever.  Loss of appetite.  Pain in your forehead, behind your eyes, and over your cheekbones (sinus pain).  Muscle aches. How is this diagnosed? Your health care provider may diagnose a URI by:  Physical exam.  Tests to check that your symptoms are not due to another condition such as:  Strep throat.  Sinusitis.  Pneumonia.  Asthma. How is this treated? A URI goes away on its own with time. It cannot be cured with medicines, but medicines may be prescribed or recommended to relieve symptoms. Medicines may help:  Reduce your fever.  Reduce your cough.  Relieve nasal congestion. Follow these instructions at home:  Take medicines only as directed by your health care provider.  Gargle warm saltwater or take cough drops to comfort your throat as directed by your health care provider.  Use a warm mist humidifier or inhale steam from a shower to increase air moisture. This may make it easier to breathe.  Drink enough fluid to keep your urine clear or pale yellow.  Eat soups and other clear broths and maintain good nutrition.  Rest as needed.  Return to work when your temperature has returned to normal or as your health care provider advises. You may need to stay home longer to avoid infecting others. You can also use a face mask and careful hand washing to  prevent spread of the virus.  Increase the usage of your inhaler if you have asthma.  Do not use any tobacco products, including cigarettes, chewing tobacco, or electronic cigarettes. If you need help quitting, ask your health care provider. How is this prevented? The best way to protect yourself from getting a cold is to practice good hygiene.  Avoid oral or hand contact with people with cold symptoms.  Wash your hands often if  contact occurs. There is no clear evidence that vitamin C, vitamin E, echinacea, or exercise reduces the chance of developing a cold. However, it is always recommended to get plenty of rest, exercise, and practice good nutrition. Contact a health care provider if:  You are getting worse rather than better.  Your symptoms are not controlled by medicine.  You have chills.  You have worsening shortness of breath.  You have brown or red mucus.  You have yellow or brown nasal discharge.  You have pain in your face, especially when you bend forward.  You have a fever.  You have swollen neck glands.  You have pain while swallowing.  You have white areas in the back of your throat. Get help right away if:  You have severe or persistent:  Headache.  Ear pain.  Sinus pain.  Chest pain.  You have chronic lung disease and any of the following:  Wheezing.  Prolonged cough.  Coughing up blood.  A change in your usual mucus.  You have a stiff neck.  You have changes in your:  Vision.  Hearing.  Thinking.  Mood. This information is not intended to replace advice given to you by your health care provider. Make sure you discuss any questions you have with your health care provider. Document Released: 01/30/2001 Document Revised: 04/08/2016 Document Reviewed: 11/11/2013 Elsevier Interactive Patient Education  2017 [employer].

## 2016-11-21 ENCOUNTER — Encounter: Payer: Self-pay | Admitting: Nurse Practitioner

## 2016-11-21 ENCOUNTER — Other Ambulatory Visit: Payer: BLUE CROSS/BLUE SHIELD

## 2016-11-21 ENCOUNTER — Ambulatory Visit (INDEPENDENT_AMBULATORY_CARE_PROVIDER_SITE_OTHER): Payer: BLUE CROSS/BLUE SHIELD | Admitting: Nurse Practitioner

## 2016-11-21 VITALS — BP 124/72 | HR 63 | Temp 98.1°F | Ht 64.0 in | Wt 220.0 lb

## 2016-11-21 DIAGNOSIS — N343 Urethral syndrome, unspecified: Secondary | ICD-10-CM

## 2016-11-21 LAB — POCT URINALYSIS DIPSTICK
BILIRUBIN UA: NEGATIVE
Glucose, UA: NEGATIVE
Ketones, UA: NEGATIVE
NITRITE UA: NEGATIVE
PH UA: 6 (ref 5.0–8.0)
PROTEIN UA: NEGATIVE
RBC UA: NEGATIVE
Spec Grav, UA: 1.015 (ref 1.030–1.035)
UROBILINOGEN UA: 0.2 (ref ?–2.0)

## 2016-11-21 MED ORDER — NITROFURANTOIN MONOHYD MACRO 100 MG PO CAPS: 100.0000 mg | ORAL_CAPSULE | Freq: Two times a day (BID) | ORAL | 0 refills | Status: DC

## 2016-11-21 MED ORDER — SACCHAROMYCES BOULARDII 250 MG PO CAPS: 250.0000 mg | ORAL_CAPSULE | Freq: Two times a day (BID) | ORAL | 0 refills | Status: DC

## 2016-11-21 NOTE — Progress Notes (Signed)
Subjective:  Patient ID: Christine Norman, female    DOB: 1960-10-30  Age: 56 y.o. MRN: [id number]  CC: Urinary Tract Infection (pelvic pain,frequent urinate, pressure for 2wk. )   Urinary Tract Infection   This is a new problem. The current episode started 1 to 4 weeks ago. The problem occurs every urination. The problem has been gradually worsening. The quality of the pain is described as burning. The pain is moderate. She is not sexually active. There is no history of pyelonephritis. Associated symptoms include frequency and urgency. Pertinent negatives include no chills, discharge, flank pain, hematuria, hesitancy, nausea, possible pregnancy, sweats or vomiting. She has tried increased fluids and home medications for the symptoms. The treatment provided mild relief. There is no history of recurrent UTIs or urinary stasis.    Outpatient Medications Prior to Visit  Medication Sig Dispense Refill  . Cascara [place] 450 MG CAPS 390 mg  Take 1 tablet with each meal as needed.    . cholecalciferol (VITAMIN D) 1000 UNITS tablet Take 1,000 Units by mouth daily.    . diclofenac (VOLTAREN) 75 MG EC tablet 2 (two) times daily as needed.     . methocarbamol (ROBAXIN) 500 MG tablet Take 1 tablet (500 mg total) by mouth 3 (three) times daily as needed. 270 tablet 3  . rosuvastatin (CRESTOR) 10 MG tablet Take 1 tablet (10 mg total) by mouth daily. 90 tablet 3  . SYNTHROID 100 MCG tablet TAKE 1 TABLET DAILY 90 tablet 0  . temazepam (RESTORIL) 15 MG capsule Take 1 capsule (15 mg total) by mouth at bedtime as needed for sleep. 90 capsule 1  . albuterol (PROVENTIL HFA;VENTOLIN HFA) 108 (90 Base) MCG/ACT inhaler Inhale 1-2 puffs into the lungs every 6 (six) hours as needed for wheezing or shortness of breath. (Patient not taking: Reported on 11/21/2016) 1 Inhaler 1  . azithromycin (ZITHROMAX) 500 MG tablet Take 1 tablet (500 mg total) by mouth daily. (Patient not taking: Reported on 11/21/2016)     No  facility-administered medications prior to visit.     ROS See HPI  Objective:  BP 124/72   Pulse 63   Temp 98.1 F (36.7 C)   Ht 5\' 4"  (1.626 m)   Wt 220 lb (99.8 kg)   SpO2 97%   BMI 37.76 kg/m   BP Readings from Last 3 Encounters:  11/21/16 124/72  11/14/16 (!) 108/58  09/28/16 124/72    Wt Readings from Last 3 Encounters:  11/21/16 220 lb (99.8 kg)  11/14/16 220 lb (99.8 kg)  09/28/16 222 lb 8 oz (100.9 kg)    Physical Exam  Constitutional: She is oriented to person, place, and time. No distress.  Cardiovascular: Normal rate.   Pulmonary/Chest: Effort normal.  Abdominal: Soft. Bowel sounds are normal. She exhibits no distension. There is tenderness.  Suprapubic tenderness  Neurological: She is alert and oriented to person, place, and time.  Skin: Skin is warm and dry.  Vitals reviewed.   Lab Results  Component Value Date   WBC 7.8 11/02/2015   HGB 13.2 11/02/2015   HCT 39.9 11/02/2015   PLT 265.0 11/02/2015   GLUCOSE 109 (H) 11/02/2015   CHOL 203 (H) 06/14/2014   TRIG 239 (H) 06/14/2014   HDL 50 06/14/2014   LDLCALC 105 (H) 06/14/2014   ALT 15 11/02/2015   AST 18 11/02/2015   NA 140 11/02/2015   K 4.3 11/02/2015   CL 103 11/02/2015   CREATININE 0.79 11/02/2015   BUN  16 11/02/2015   CO2 30 11/02/2015   TSH 2.10 11/02/2015   HGBA1C 5.5 11/19/2014    Dg Chest 2 View  Result Date: 09/28/2016 CLINICAL DATA:  Cough EXAM: CHEST  2 VIEW COMPARISON:  June 20, 2006 FINDINGS: There is no edema or consolidation. The heart size and pulmonary vascularity are normal. No adenopathy. No bone lesions. IMPRESSION: No edema or consolidation. Electronically Signed   By: Lowella Grip III M.D.   On: 09/28/2016 16:46    Assessment & Plan:   Laurette was seen today for urinary tract infection.  Diagnoses and all orders for this visit:  Dysuria-frequency syndrome -     POCT urinalysis dipstick -     Urine culture; Future -     nitrofurantoin,  macrocrystal-monohydrate, (MACROBID) 100 MG capsule; Take 1 capsule (100 mg total) by mouth 2 (two) times daily. -     saccharomyces boulardii (FLORASTOR) 250 MG capsule; Take 1 capsule (250 mg total) by mouth 2 (two) times daily.   I have discontinued Ms. Leonidas Romberg azithromycin and albuterol. I am also having her start on nitrofurantoin (macrocrystal-monohydrate) and saccharomyces boulardii. Additionally, I am having her maintain her diclofenac, cholecalciferol, Cascara Sagrada, rosuvastatin, methocarbamol, temazepam, and SYNTHROID.  Meds ordered this encounter  Medications  . nitrofurantoin, macrocrystal-monohydrate, (MACROBID) 100 MG capsule    Sig: Take 1 capsule (100 mg total) by mouth 2 (two) times daily.    Dispense:  14 capsule    Refill:  0    Order Specific Question:   Supervising Provider    Answer:   Cassandria Anger [[id number]]  . saccharomyces boulardii (FLORASTOR) 250 MG capsule    Sig: Take 1 capsule (250 mg total) by mouth 2 (two) times daily.    Dispense:  20 capsule    Refill:  0    Order Specific Question:   Supervising Provider    Answer:   Cassandria Anger [[id number]]    Follow-up: Return if symptoms worsen or fail to improve.  Wilfred Lacy, NP

## 2016-11-21 NOTE — Patient Instructions (Signed)

## 2016-11-21 NOTE — Progress Notes (Signed)
Pre visit review using our clinic review tool, if applicable. No additional management support is needed unless otherwise documented below in the visit note. 

## 2016-11-22 LAB — URINE CULTURE

## 2016-11-26 ENCOUNTER — Ambulatory Visit (INDEPENDENT_AMBULATORY_CARE_PROVIDER_SITE_OTHER): Payer: BLUE CROSS/BLUE SHIELD | Admitting: Family Medicine

## 2016-11-26 ENCOUNTER — Encounter: Payer: Self-pay | Admitting: Family Medicine

## 2016-11-26 VITALS — BP 116/86 | HR 65 | Temp 98.6°F | Wt 221.5 lb

## 2016-11-26 DIAGNOSIS — F329 Major depressive disorder, single episode, unspecified: Secondary | ICD-10-CM | POA: Diagnosis not present

## 2016-11-26 DIAGNOSIS — R1030 Lower abdominal pain, unspecified: Secondary | ICD-10-CM

## 2016-11-26 DIAGNOSIS — F32A Depression, unspecified: Secondary | ICD-10-CM

## 2016-11-26 LAB — POC URINALSYSI DIPSTICK (AUTOMATED)
Bilirubin, UA: NEGATIVE
Glucose, UA: NEGATIVE
KETONES UA: NEGATIVE
Leukocytes, UA: NEGATIVE
Nitrite, UA: NEGATIVE
Protein, UA: NEGATIVE
Spec Grav, UA: 1.025 (ref 1.030–1.035)
UROBILINOGEN UA: 0.2 (ref ?–2.0)
pH, UA: 6 (ref 5.0–8.0)

## 2016-11-26 MED ORDER — ESCITALOPRAM OXALATE 10 MG PO TABS: 10.0000 mg | ORAL_TABLET | Freq: Every day | ORAL | Status: DC

## 2016-11-26 MED ORDER — OXYBUTYNIN CHLORIDE 5 MG PO TABS: 5.0000 mg | ORAL_TABLET | Freq: Three times a day (TID) | ORAL | 1 refills | Status: DC | PRN

## 2016-11-26 NOTE — Patient Instructions (Addendum)
Try ditropan and update me as needed.   Cut back on caffeine gradually.   Drop off a clean catch urine sample in about 1 week.  Take care.  Glad to see you.

## 2016-11-26 NOTE — Progress Notes (Signed)
Able to tolerate lexapro, with relief.  Med and allergy list updated.  Her mood is better in the meantime.  She is smiling today and she feels better overall. She has noted stressors but she is more able to tolerate these now.  For about 3-4 weeks she'll have some lower abd pain. Lower midline abd pain.  Can wake her up from sleep.  She had to urinate, cramping with urination but not burning with urination.  She had "contractions" w/o much UOP.  She tired cystex w/ some mild relief.  She was seen, check ucx (neg) and stopped abx.  She hasn't had another night like prev but she still has lower abd pressure.     She has constant lower abd pain discomfort, with suprapubic area ttp.  She drinks a lot of water.  She has longstanding (close to 1 year) urinary frequency at baseline, but clearly with addition of pressure sensation in the last few weeks.    She drinks 2 cups of coffee in the AM, some tea at night.    Minimal SUI with cough/laugh/sneeze.  Never had to wear a pad.  No bleeding.  No bloody urine.    PMH and SH reviewed  ROS: Per HPI unless specifically indicated in ROS section.  No FCNAVD.  No rash.   Meds, vitals, and allergies reviewed.   GEN: nad, alert and oriented HEENT: mucous membranes moist NECK: supple CV: rrr.  PULM: ctab, no inc wob ABD: soft, +bs, suprapubic area slightly tender EXT: no edema BACK: no CVA pain  u/a d/w pt at OV.

## 2016-11-27 ENCOUNTER — Encounter: Payer: Self-pay | Admitting: Family Medicine

## 2016-11-27 DIAGNOSIS — R1030 Lower abdominal pain, unspecified: Secondary | ICD-10-CM | POA: Insufficient documentation

## 2016-11-27 NOTE — Assessment & Plan Note (Signed)
Improved with Lexapro. Continue as is.

## 2016-11-27 NOTE — Assessment & Plan Note (Signed)
This may be a false positive on 1+ blood. Sent home with sterile container. She will drop off repeat sample in one week.  Recent culture data reviewed. Off antibiotics appropriately at this point. Previous office visit note regarding urine culture reviewed and discussed with patient. More likely that she is having bladder spasm. This could be induced by caffeine. Gradually taper caffeine. Also reasonable to try oxybutynin as needed. Discussed with patient. Prescription sent. Update me as needed. Okay for outpatient follow-up.

## 2016-12-05 ENCOUNTER — Encounter: Payer: Self-pay | Admitting: Family Medicine

## 2016-12-06 ENCOUNTER — Encounter: Payer: Self-pay | Admitting: Family Medicine

## 2016-12-07 ENCOUNTER — Other Ambulatory Visit: Payer: Self-pay | Admitting: *Deleted

## 2016-12-07 MED ORDER — ESCITALOPRAM OXALATE 10 MG PO TABS
10.0000 mg | ORAL_TABLET | Freq: Every day | ORAL | 0 refills | Status: DC
Start: 2016-12-07 — End: 2016-12-10

## 2016-12-10 ENCOUNTER — Other Ambulatory Visit: Payer: Self-pay | Admitting: Family Medicine

## 2016-12-10 MED ORDER — ESCITALOPRAM OXALATE 10 MG PO TABS: 10.0000 mg | ORAL_TABLET | Freq: Every day | ORAL | 1 refills | Status: DC

## 2016-12-10 MED ORDER — LEVOTHYROXINE SODIUM 100 MCG PO TABS: 100.0000 ug | ORAL_TABLET | Freq: Every day | ORAL | 1 refills | Status: DC

## 2017-03-01 ENCOUNTER — Encounter: Payer: Self-pay | Admitting: Family Medicine

## 2017-03-01 ENCOUNTER — Ambulatory Visit (INDEPENDENT_AMBULATORY_CARE_PROVIDER_SITE_OTHER): Payer: BLUE CROSS/BLUE SHIELD | Admitting: Family Medicine

## 2017-03-01 DIAGNOSIS — L03019 Cellulitis of unspecified finger: Secondary | ICD-10-CM

## 2017-03-01 MED ORDER — PREDNISONE 20 MG PO TABS: 20.0000 mg | ORAL_TABLET | Freq: Every day | ORAL | 0 refills | Status: DC

## 2017-03-01 MED ORDER — DOXYCYCLINE HYCLATE 100 MG PO TABS: 100.0000 mg | ORAL_TABLET | Freq: Two times a day (BID) | ORAL | 0 refills | Status: DC

## 2017-03-01 NOTE — Patient Instructions (Signed)
Take a picture of the finger for comparison.  If fever or significantly worse, then call the on call doc.  Take prednisone with food.  Start doxy- sun caution.  Update me Monday.  Take care.  Glad to see you.

## 2017-03-01 NOTE — Progress Notes (Signed)
L 2nd finger.  She was stung/bitten by insect.  She saw it happen but couldn't ID it for certain.  It got worse in the meantime, esp in the last 24 hours.  Itchy and sensitive, puffy and painful.    In childhood she had local reactions but not systemic sx with stings.    No FCNAVD.  She doesn't feel sick except for her hand sx.    No wheeze or lip or tongue swelling.  Tried benadryl w/o much relief.    Meds, vitals, and allergies reviewed.   ROS: Per HPI unless specifically indicated in ROS section   nad ncat L hand with possible sting mark on the middle phalanx.   L 2nd finger locally puffy but NV intact/ ROM limited by swelling but no tendon failure, no fluctuant mass.   Normal hand inspection and sensation and range of motion otherwise.

## 2017-03-03 DIAGNOSIS — L03019 Cellulitis of unspecified finger: Secondary | ICD-10-CM | POA: Insufficient documentation

## 2017-03-03 NOTE — Assessment & Plan Note (Addendum)
This could be inflammatory/allergic or infectious, or both. Discussed with patient. She'll take a picture of the finger for comparison.  If fever or significantly worse, then call the on call doc.  Take prednisone with food.  Start doxy- sun caution.  Update me Monday.  At this point still okay for outpatient follow-up. She agrees. No fluctuant mass for incision and drainage.

## 2017-03-04 ENCOUNTER — Encounter: Payer: Self-pay | Admitting: Family Medicine

## 2017-03-05 ENCOUNTER — Other Ambulatory Visit: Payer: Self-pay | Admitting: Family Medicine

## 2017-03-05 DIAGNOSIS — G4700 Insomnia, unspecified: Secondary | ICD-10-CM

## 2017-03-05 MED ORDER — TEMAZEPAM 15 MG PO CAPS: 15.0000 mg | ORAL_CAPSULE | Freq: Every evening | ORAL | 1 refills | Status: DC | PRN

## 2017-05-27 ENCOUNTER — Encounter: Payer: Self-pay | Admitting: Family Medicine

## 2017-05-27 ENCOUNTER — Ambulatory Visit (INDEPENDENT_AMBULATORY_CARE_PROVIDER_SITE_OTHER): Payer: BLUE CROSS/BLUE SHIELD | Admitting: Family Medicine

## 2017-05-27 VITALS — BP 118/70 | HR 60 | Temp 98.7°F | Wt 225.8 lb

## 2017-05-27 DIAGNOSIS — R609 Edema, unspecified: Secondary | ICD-10-CM | POA: Diagnosis not present

## 2017-05-27 DIAGNOSIS — K219 Gastro-esophageal reflux disease without esophagitis: Secondary | ICD-10-CM | POA: Diagnosis not present

## 2017-05-27 MED ORDER — RANITIDINE HCL 150 MG PO TABS: 150.0000 mg | ORAL_TABLET | Freq: Every day | ORAL | Status: DC

## 2017-05-27 NOTE — Patient Instructions (Addendum)
Go to the lab on the way out.  We'll contact you with your lab report. Try to keep your feet propped up.  Try some OTC compression stockings.  Try to limit salt.  Try zantac in the meantime.  Take care.  Glad to see you.

## 2017-05-27 NOTE — Progress Notes (Signed)
She has some swelling today in the BLE but it was clearly worse a few days ago.  She prev had trouble getting her shoes on- both feet.  No CP, not SOB.  Started about 1 month ago with some interval improvement/waxing and waning.  She is on baseline meds.  She has h/o BLE edema at baseline but this was atypical.    She has some indigestion but no exertional sx; with some relief with TUMS.  It can happen at rest.  Sometimes walking around helps with the indigestion.    No more salt in diet; at baseline with salt intake.  She has been trying to elevate her legs w/o resolution.  She hasn't taken diclofenac recently.    Her hands have been a little puffy, episodically.   Meds, vitals, and allergies reviewed.   ROS: Per HPI unless specifically indicated in ROS section   GEN: nad, alert and oriented HEENT: mucous membranes moist NECK: supple w/o LA CV: rrr. PULM: ctab, no inc wob ABD: soft, +bs EXT: 1+ nonpitting BLE edema SKIN: no acute rash 43cm B calf circumference.   Intact B DP pulses.

## 2017-05-28 DIAGNOSIS — R609 Edema, unspecified: Secondary | ICD-10-CM | POA: Insufficient documentation

## 2017-05-28 LAB — CBC WITH DIFFERENTIAL/PLATELET
BASOS PCT: 0.8 %
Basophils Absolute: 61 cells/uL (ref 0–200)
EOS PCT: 2.6 %
Eosinophils Absolute: 198 cells/uL (ref 15–500)
HCT: 37.8 % (ref 35.0–45.0)
Hemoglobin: 12.6 g/dL (ref 11.7–15.5)
Lymphs Abs: 1824 cells/uL (ref 850–3900)
MCH: 27.9 pg (ref 27.0–33.0)
MCHC: 33.3 g/dL (ref 32.0–36.0)
MCV: 83.6 fL (ref 80.0–100.0)
MONOS PCT: 7.7 %
MPV: 10.7 fL (ref 7.5–12.5)
Neutro Abs: 4932 cells/uL (ref 1500–7800)
Neutrophils Relative %: 64.9 %
PLATELETS: 241 10*3/uL (ref 140–400)
RBC: 4.52 10*6/uL (ref 3.80–5.10)
RDW: 13.4 % (ref 11.0–15.0)
TOTAL LYMPHOCYTE: 24 %
WBC mixed population: 585 cells/uL (ref 200–950)
WBC: 7.6 10*3/uL (ref 3.8–10.8)

## 2017-05-28 LAB — COMPREHENSIVE METABOLIC PANEL
AG RATIO: 1.7 (calc) (ref 1.0–2.5)
ALBUMIN MSPROF: 4.2 g/dL (ref 3.6–5.1)
ALKALINE PHOSPHATASE (APISO): 70 U/L (ref 33–130)
ALT: 27 U/L (ref 6–29)
AST: 26 U/L (ref 10–35)
BUN: 15 mg/dL (ref 7–25)
CHLORIDE: 104 mmol/L (ref 98–110)
CO2: 28 mmol/L (ref 20–32)
CREATININE: 1.03 mg/dL (ref 0.50–1.05)
Calcium: 9.2 mg/dL (ref 8.6–10.4)
GLOBULIN: 2.5 g/dL (ref 1.9–3.7)
Glucose, Bld: 137 mg/dL — ABNORMAL HIGH (ref 65–99)
POTASSIUM: 4.4 mmol/L (ref 3.5–5.3)
Sodium: 140 mmol/L (ref 135–146)
Total Bilirubin: 0.5 mg/dL (ref 0.2–1.2)
Total Protein: 6.7 g/dL (ref 6.1–8.1)

## 2017-05-28 NOTE — Assessment & Plan Note (Signed)
Waxing and waning. Lungs are clear. No exertional chest pain. Okay for outpatient follow-up. Check routine labs today. See notes on labs. Would try to elevate legs as much as possible, limit salt, use over-the-counter compression stockings.

## 2017-05-28 NOTE — Assessment & Plan Note (Signed)
Reasonable to try zantac 150mg  a day and update [place] if not better.  She agrees.

## 2017-06-03 ENCOUNTER — Telehealth: Payer: Self-pay | Admitting: Family Medicine

## 2017-06-03 ENCOUNTER — Other Ambulatory Visit: Payer: Self-pay | Admitting: *Deleted

## 2017-06-03 MED ORDER — OXYBUTYNIN CHLORIDE 5 MG PO TABS: 5.0000 mg | ORAL_TABLET | Freq: Three times a day (TID) | ORAL | 1 refills | Status: DC | PRN

## 2017-06-03 NOTE — Telephone Encounter (Signed)
Overdue for f/u urine sample- we sent her home with a container to drop off a sample.  Overdue lab alert brought this to my attention.  The order is still in.  Needs u/a, micro as ordered.  Thanks.

## 2017-06-03 NOTE — Telephone Encounter (Signed)
Spoke with patient who will get it in within a few days.

## 2017-07-02 ENCOUNTER — Encounter: Payer: Self-pay | Admitting: Family Medicine

## 2017-07-02 ENCOUNTER — Other Ambulatory Visit: Payer: Self-pay | Admitting: *Deleted

## 2017-07-02 MED ORDER — ESCITALOPRAM OXALATE 10 MG PO TABS: 10.0000 mg | ORAL_TABLET | Freq: Every day | ORAL | 1 refills | Status: DC

## 2017-07-31 ENCOUNTER — Ambulatory Visit
Admission: RE | Admit: 2017-07-31 | Discharge: 2017-07-31 | Disposition: A | Discharge status: Home / Self Care | Payer: BLUE CROSS/BLUE SHIELD | Source: Ambulatory Visit | Attending: Otolaryngology | Admitting: Otolaryngology

## 2017-07-31 ENCOUNTER — Other Ambulatory Visit: Payer: Self-pay | Admitting: Otolaryngology

## 2017-07-31 DIAGNOSIS — R0602 Shortness of breath: Secondary | ICD-10-CM

## 2017-07-31 DIAGNOSIS — R059 Cough, unspecified: Secondary | ICD-10-CM

## 2017-07-31 DIAGNOSIS — R05 Cough: Secondary | ICD-10-CM

## 2017-08-05 ENCOUNTER — Other Ambulatory Visit: Payer: Self-pay | Admitting: *Deleted

## 2017-08-05 ENCOUNTER — Encounter: Payer: Self-pay | Admitting: Family Medicine

## 2017-08-05 MED ORDER — LEVOTHYROXINE SODIUM 100 MCG PO TABS: 100.0000 ug | ORAL_TABLET | Freq: Every day | ORAL | 1 refills | Status: DC

## 2017-08-26 ENCOUNTER — Telehealth: Payer: Self-pay | Admitting: Family Medicine

## 2017-08-26 NOTE — Telephone Encounter (Signed)
Late entry re: CXR on 07/31/17.  Per ordering MD.

## 2017-08-30 ENCOUNTER — Encounter: Payer: Self-pay | Admitting: Family Medicine

## 2017-09-01 ENCOUNTER — Telehealth: Payer: Self-pay | Admitting: Family Medicine

## 2017-09-01 DIAGNOSIS — G4700 Insomnia, unspecified: Secondary | ICD-10-CM

## 2017-09-01 MED ORDER — TEMAZEPAM 15 MG PO CAPS: 15.0000 mg | ORAL_CAPSULE | Freq: Every evening | ORAL | 1 refills | Status: DC | PRN

## 2017-09-01 NOTE — Telephone Encounter (Signed)
Printed.  Please fax in.  Thanks.  [employer].

## 2017-09-02 NOTE — Telephone Encounter (Signed)
Faxed to [facility]

## 2017-10-11 ENCOUNTER — Other Ambulatory Visit: Payer: Self-pay | Admitting: *Deleted

## 2017-10-11 ENCOUNTER — Encounter: Payer: Self-pay | Admitting: Family Medicine

## 2017-10-11 MED ORDER — ROSUVASTATIN CALCIUM 10 MG PO TABS: 10.0000 mg | ORAL_TABLET | Freq: Every day | ORAL | 1 refills | Status: DC

## 2017-10-11 MED ORDER — OXYBUTYNIN CHLORIDE 5 MG PO TABS: 5.0000 mg | ORAL_TABLET | Freq: Three times a day (TID) | ORAL | 1 refills | Status: DC | PRN

## 2017-10-21 ENCOUNTER — Encounter: Payer: Self-pay | Admitting: Internal Medicine

## 2017-10-21 ENCOUNTER — Ambulatory Visit: Payer: BLUE CROSS/BLUE SHIELD | Admitting: Internal Medicine

## 2017-10-21 VITALS — BP 118/80 | HR 55 | Temp 98.3°F | Wt 224.0 lb

## 2017-10-21 DIAGNOSIS — J329 Chronic sinusitis, unspecified: Secondary | ICD-10-CM

## 2017-10-21 DIAGNOSIS — B9789 Other viral agents as the cause of diseases classified elsewhere: Secondary | ICD-10-CM

## 2017-10-21 MED ORDER — METHYLPREDNISOLONE ACETATE 80 MG/ML IJ SUSP
80.0000 mg | Freq: Once | INTRAMUSCULAR | Status: AC
  Administered 2017-10-21: 80 mg via INTRAMUSCULAR

## 2017-10-21 NOTE — Patient Instructions (Signed)

## 2017-10-21 NOTE — Addendum Note (Signed)
Addended by: Lurlean Nanny on: 10/21/2017 04:56 PM   Modules accepted: Orders

## 2017-10-21 NOTE — Progress Notes (Signed)
HPI  Pt presents to the clinic today with c/o ear fullness, sore throat and cough. She reports this started 4-5 days ago. She denies ear pain or decreased hearing. She denies difficulty swallowing but reports her throat feels tight. The cough is mostly nonproductive. She denies fever, chills or body aches. She has tried Mucinex without any relief. She has a history of allergies. She has had sick contacts.  Review of Systems      Past Medical History:  Diagnosis Date  . Allergy    seasonal  . Arthritis    ankles and back, s/p epidural injections  . 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   . Back pain   . Clotting disorder ([place])    history of 2 blood clots  . Constipation    chronic- takes OTC med that helps at times and others not   . Depression   . DVT (deep venous thrombosis), right 2000   rt knee-ankle  . GERD (gastroesophageal reflux disease)    IBS  . Hyperlipidemia   . Hypertension    not medicated  . Insomnia   . Thyroid disease    hypothyroidism    Family History  Problem Relation Age of Onset  . Arthritis Mother   . Diabetes Mother   . Heart failure Father   . Atrial fibrillation Father   . Colon polyps Father   . Heart disease Maternal Grandmother   . Heart disease Maternal Grandfather   . Colon cancer Neg Hx   . Rectal cancer Neg Hx   . Stomach cancer Neg Hx     Social History   Socioeconomic History  . Marital status: Married    Spouse name: Not on file  . Number of children: Not on file  . Years of education: Not on file  . Highest education level: Not on file  Social Needs  . Financial resource strain: Not on file  . Food insecurity - worry: Not on file  . Food insecurity - inability: Not on file  . Transportation needs - medical: Not on file  . Transportation needs - non-medical: Not on file  Occupational  History  . Not on file  Tobacco Use  . Smoking status: Former Smoker    Packs/day: 1.00    Years: 20.00    Pack years: 20.00    Last attempt to quit: 03/20/2010    Years since quitting: 7.5  . Smokeless tobacco: Never Used  Substance and Sexual Activity  . Alcohol use: Yes    Alcohol/week: 0.0 oz    Comment: one a month-socially  . Drug use: No  . Sexual activity: No  Other Topics Concern  . Not on file  Social History Narrative   Divorced 2008   No kids   [employer] fan   Works at [employer] [place]  . Celebrex [Celecoxib] Hives  . Etodolac     SOB/pain     Constitutional:  Denies headache, fatigue, fever or abrupt weight changes.  HEENT:  Positive ear fullness, sore throat. Denies eye redness, eye pain, pressure behind the eyes, facial pain, nasal congestion, ear pain, ringing in the ears, wax buildup, runny nose or bloody nose. Respiratory: Positive cough. Denies difficulty breathing or shortness of breath.  Cardiovascular: Denies chest pain, chest tightness, palpitations or swelling in the hands or feet.   No other specific  complaints in a complete review of systems (except as listed in HPI above).  Objective:   BP 118/80   Pulse (!) 55   Temp 98.3 F (36.8 C) (Oral)   Wt 224 lb (101.6 kg)   SpO2 99%   BMI 38.45 kg/m    Wt Readings from Last 3 Encounters:  10/21/17 224 lb (101.6 kg)  05/27/17 225 lb 12 oz (102.4 kg)  03/01/17 221 lb (100.2 kg)     General: Appears her stated age, in NAD. HEENT: Head: normal shape and size, maxillary sinus tenderness noted;  Ears: Tm's gray and intact, normal light reflex; Nose: mucosa pink and moist, septum midline; Throat/Mouth: + PND. Teeth present, mucosa pink and moist, no exudate noted, no lesions or ulcerations noted.  Neck: No cervical lymphadenopathy.  Cardiovascular: Normal rate and rhythm. S1,S2 noted.  No murmur, rubs or gallops noted.  Pulmonary/Chest: Normal effort and positive vesicular breath  sounds. No respiratory distress. No wheezes, rales or ronchi noted.       Assessment & Plan:   Viral Sinusitis:  Can try Neti Pot that can be purchased at your pharmacy Start Zyrtec and Flonase OTC 80 mg Depo IM today  RTC as needed or if symptoms persist.   Webb Silversmith, NP

## 2017-12-02 ENCOUNTER — Ambulatory Visit: Payer: BLUE CROSS/BLUE SHIELD | Admitting: Family Medicine

## 2017-12-02 ENCOUNTER — Encounter: Payer: Self-pay | Admitting: Family Medicine

## 2017-12-02 VITALS — BP 116/78 | HR 61 | Temp 98.6°F | Wt 218.0 lb

## 2017-12-02 DIAGNOSIS — R5383 Other fatigue: Secondary | ICD-10-CM | POA: Diagnosis not present

## 2017-12-02 DIAGNOSIS — F32A Depression, unspecified: Secondary | ICD-10-CM

## 2017-12-02 DIAGNOSIS — R8290 Unspecified abnormal findings in urine: Secondary | ICD-10-CM | POA: Diagnosis not present

## 2017-12-02 DIAGNOSIS — K5900 Constipation, unspecified: Secondary | ICD-10-CM | POA: Diagnosis not present

## 2017-12-02 DIAGNOSIS — M1990 Unspecified osteoarthritis, unspecified site: Secondary | ICD-10-CM

## 2017-12-02 DIAGNOSIS — F329 Major depressive disorder, single episode, unspecified: Secondary | ICD-10-CM

## 2017-12-02 DIAGNOSIS — R1030 Lower abdominal pain, unspecified: Secondary | ICD-10-CM

## 2017-12-02 NOTE — Assessment & Plan Note (Signed)
Now retired, consider slow taper of SSRI in the future given removal of work stress.  I didn't want to change her meds at this point, with other w/u on going.  She agrees.  See AVS.

## 2017-12-02 NOTE — Assessment & Plan Note (Signed)
With B knee pain, likely with exacerbation of OA with weight bearing with swelling noted with extension but not with flexion.  With intermittent sx and no provocation for DVT, then DVT unlikely and not needing d dimer or u/s at this point.  D/w pt.  She agrees.  She can f/u with ortho prn injection.

## 2017-12-02 NOTE — Patient Instructions (Addendum)
Call about a mammogram.  Physical here when possible (or get pap done at gyn clinic).  Go to the lab on the way out.  We'll contact you with your lab report. We'll go from there.   Take care.  Glad to see you.

## 2017-12-02 NOTE — Assessment & Plan Note (Signed)
No dysuria, recheck labs today.  D/wpt.  See notes on labs.

## 2017-12-02 NOTE — Assessment & Plan Note (Signed)
Consider retrial of miralax if TSH unremarkable.  D/w pt. She agrees.  Okay for outpatient f/u.

## 2017-12-02 NOTE — Addendum Note (Signed)
Addended by: Ellamae Sia on: 12/02/2017 09:02 AM   Modules accepted: Orders

## 2017-12-02 NOTE — Progress Notes (Signed)
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    Constipation.  She was taking cascara, that helped but then she had some cramping and stopped it, the cramping got better.  Prev colonoscopy done.  10 year f/u.  She is taking stool softeners w/o a lot of relief.  No blood in stool.  No vomiting.    B knee pain.  L>R now but recently injected by ortho.  She is considering replacement at some point, she is trying to put off as long as possible.  She has a f/u injection pending.  If she has been on her feet most of the day, then she has deep pain in the back of the L knee with full extension but not flexion.  Pain is intermittent.  She doesn't have that pain in the AM o/w or with flexion.  She has h/o DVT after procedures prev, ie provoked, R side.  She isn't anticoagulated now.  Pain started about 3-4 weeks ago.  She has B ankle swelling at baseline, change.    Fatigue.  See above re: retirement.  Recently retired.  Even with good night of sleep, she is still tired the next day.  She occ needs a nap about 3PM daily, if schedule allows.  Snoring noted.  No known apnea.    She is due for f/u u/a, d/w pt.  No dysuria.  H/o hematuria on dip.    PMH and SH reviewed  ROS: Per HPI unless specifically indicated in ROS section   Meds, vitals, and allergies reviewed.   nad ncat Mmm Neck supple, no LA no TMG rrr ctab abd soft, not ttp, normal BS Ext with 44cm calf B, trace BLE.  Calf not ttp B Normal cap refill Varicose vein noted on R calf, not ttp

## 2017-12-02 NOTE — Addendum Note (Signed)
Addended by: Ellamae Sia on: 12/02/2017 09:29 AM   Modules accepted: Orders

## 2017-12-02 NOTE — Assessment & Plan Note (Signed)
Could be due to OSA vs hypothyroidism.  Check labs, if neg then consider sleep study.  No change in meds for now.  D/w pt.  She agrees.  >25 minutes spent in face to face time with patient, >50% spent in counselling or coordination of care, discussing fatigue, prev knee pain, etc.

## 2017-12-03 LAB — URINALYSIS W MICROSCOPIC + REFLEX CULTURE
BACTERIA UA: NONE SEEN /HPF
BILIRUBIN URINE: NEGATIVE
Glucose, UA: NEGATIVE
HYALINE CAST: NONE SEEN /LPF
Hgb urine dipstick: NEGATIVE
Ketones, ur: NEGATIVE
LEUKOCYTE ESTERASE: NEGATIVE
Nitrites, Initial: NEGATIVE
PROTEIN: NEGATIVE
RBC / HPF: NONE SEEN /HPF (ref 0–2)
SQUAMOUS EPITHELIAL / LPF: NONE SEEN /HPF (ref <=5)
Specific Gravity, Urine: 1.018 (ref 1.001–1.03)
WBC, UA: NONE SEEN /HPF (ref 0–5)
pH: 6 (ref 5.0–8.0)

## 2017-12-03 LAB — CBC WITH DIFFERENTIAL/PLATELET
BASOS PCT: 0.6 %
Basophils Absolute: 48 cells/uL (ref 0–200)
EOS PCT: 2.5 %
Eosinophils Absolute: 200 cells/uL (ref 15–500)
HCT: 39 % (ref 35.0–45.0)
Hemoglobin: 13.1 g/dL (ref 11.7–15.5)
Lymphs Abs: 1688 cells/uL (ref 850–3900)
MCH: 27.6 pg (ref 27.0–33.0)
MCHC: 33.6 g/dL (ref 32.0–36.0)
MCV: 82.3 fL (ref 80.0–100.0)
MPV: 10.9 fL (ref 7.5–12.5)
Monocytes Relative: 7.1 %
NEUTROS PCT: 68.7 %
Neutro Abs: 5496 cells/uL (ref 1500–7800)
PLATELETS: 223 10*3/uL (ref 140–400)
RBC: 4.74 10*6/uL (ref 3.80–5.10)
RDW: 13.7 % (ref 11.0–15.0)
Total Lymphocyte: 21.1 %
WBC mixed population: 568 cells/uL (ref 200–950)
WBC: 8 10*3/uL (ref 3.8–10.8)

## 2017-12-03 LAB — COMPREHENSIVE METABOLIC PANEL
AG Ratio: 1.4 (calc) (ref 1.0–2.5)
ALBUMIN MSPROF: 4.2 g/dL (ref 3.6–5.1)
ALT: 14 U/L (ref 6–29)
AST: 16 U/L (ref 10–35)
Alkaline phosphatase (APISO): 74 U/L (ref 33–130)
BUN: 15 mg/dL (ref 7–25)
CO2: 24 mmol/L (ref 20–32)
CREATININE: 0.95 mg/dL (ref 0.50–1.05)
Calcium: 9.1 mg/dL (ref 8.6–10.4)
Chloride: 105 mmol/L (ref 98–110)
Globulin: 3 g/dL (calc) (ref 1.9–3.7)
Glucose, Bld: 128 mg/dL — ABNORMAL HIGH (ref 65–99)
POTASSIUM: 4.4 mmol/L (ref 3.5–5.3)
SODIUM: 141 mmol/L (ref 135–146)
TOTAL PROTEIN: 7.2 g/dL (ref 6.1–8.1)
Total Bilirubin: 0.4 mg/dL (ref 0.2–1.2)

## 2017-12-03 LAB — NO CULTURE INDICATED

## 2017-12-03 LAB — TSH: TSH: 0.46 mIU/L

## 2017-12-04 ENCOUNTER — Encounter: Payer: Self-pay | Admitting: Family Medicine

## 2017-12-05 ENCOUNTER — Telehealth: Payer: Self-pay | Admitting: Family Medicine

## 2017-12-05 DIAGNOSIS — R5383 Other fatigue: Secondary | ICD-10-CM

## 2017-12-05 NOTE — Telephone Encounter (Signed)
Referral placed.

## 2017-12-23 ENCOUNTER — Encounter: Payer: Self-pay | Admitting: Family Medicine

## 2018-01-21 ENCOUNTER — Ambulatory Visit (INDEPENDENT_AMBULATORY_CARE_PROVIDER_SITE_OTHER): Payer: BLUE CROSS/BLUE SHIELD | Admitting: Pulmonary Disease

## 2018-01-21 ENCOUNTER — Encounter: Payer: Self-pay | Admitting: Pulmonary Disease

## 2018-01-21 DIAGNOSIS — G4710 Hypersomnia, unspecified: Secondary | ICD-10-CM | POA: Diagnosis not present

## 2018-01-21 NOTE — Patient Instructions (Signed)
Home sleep study will be scheduled We discussed treatment options  Decrease temazepam and taper off if not needed

## 2018-01-21 NOTE — Assessment & Plan Note (Signed)
Given excessive daytime somnolence, narrow pharyngeal exam & snoring, obstructive sleep apnea is very likely & an overnight polysomnogram will be scheduled as a home study. The pathophysiology of obstructive sleep apnea , it's cardiovascular consequences & modes of treatment including CPAP were discused with the patient in detail & they evidenced understanding.  Pretest probability is high.  If home sleep test is negative would consider an PSG followed by [facility]

## 2018-01-21 NOTE — Progress Notes (Signed)
Subjective:    Patient ID: Christine Norman, female    DOB: Dec 25, 1960, 57 y.o.   MRN: [id number]  HPI  Chief Complaint  Patient presents with  . Sleep Consult    Referred by Dr. Damita Dunnings for chronic fatigue. Per patient, her dentist believes she grinds her teeth in her sleep. Dr. Aldona Lento is her dentist.     57 year old woman presents for evaluation of excessive daytime somnolence and tiredness.  She has been evaluated by her PCP and although she has hypothyroidism, TSH has been noted to be within normal range 11/2017. Mild snoring has been noted by her husband but not witnessed apneas. Her dentist is also noted that her molars have been moving out as if she has been grinding her teeth in her sleep and suggested evaluation for sleep apnea.  She reports taking temazepam to help her sleep due to stress related insomnia more than 2 years ago. Sleepiness score is 11 and she reports sleepiness as a passenger in a car, lying down to rest in the afternoons or being inactive watching TV, Bedtime is between 10:11 PM, sleep latency is 15 to 30 minutes, she sleeps on her side with one pillow, reports 1-2 nocturnal awakenings including nocturia and is out of bed by 8:52 AM feeling still tired with dryness of mouth but denies headaches. She feels this test tired when she wakes up as when she went to bed and she finds this quite frustrating. Her weight has fluctuated within 10 pounds in the last 2 years.  She started gaining weight after she quit smoking in 2011 There is no history suggestive of cataplexy, sleep paralysis or parasomnias   She has a history of DVT x2 in her right lower extremity both times after surgery she smoked about 20 pack years before she quit in 2011. She smokes socially. She travels a lot including on a motorcycle with her husband  Chest x-ray 07/2017 noted to be clear without infiltrates or effusions     Past Medical History:  Diagnosis Date  . Allergy    seasonal  .  Arthritis    ankles and back, s/p epidural injections  . 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   . Back pain   . Clotting disorder ([place])    history of 2 blood clots  . Constipation    chronic- takes OTC med that helps at times and others not   . Depression   . DVT (deep venous thrombosis), right 2000   rt knee-ankle  . GERD (gastroesophageal reflux disease)    IBS  . Hyperlipidemia   . Hypertension    not medicated  . Insomnia   . Thyroid disease    hypothyroidism   Past Surgical History:  Procedure Laterality Date  . BREAST BIOPSY  01/29/2012   Procedure: BREAST BIOPSY WITH NEEDLE LOCALIZATION;  Surgeon: Haywood Lasso, MD;  Location: [facility];  Service: General;  Laterality: Left;  . CHOLECYSTECTOMY  2001   lap choli  . COLONOSCOPY  2006   normal  . FRACTURE SURGERY  2001   orif rt fif with knee surg  . [place]   rt and lt  . HERNIA REPAIR  03   rt ing hernia  . KNEE ARTHROSCOPY  2000   rt  . TEAR DUCT PROBING Bilateral 2017   and plug placement to  treat dry eye    Allergies  Allergen Reactions  . Celebrex [Celecoxib] Hives  . Etodolac     SOB/pain    Social History   Socioeconomic History  . Marital status: Married    Spouse name: Not on file  . Number of children: Not on file  . Years of education: Not on file  . Highest education level: Not on file  Occupational History  . Not on file  Social Needs  . Financial resource strain: Not on file  . Food insecurity:    Worry: Not on file    Inability: Not on file  . Transportation needs:    Medical: Not on file    Non-medical: Not on file  Tobacco Use  . Smoking status: Former Smoker    Packs/day: 1.00    Years: 20.00    Pack years: 20.00    Last attempt to quit: 03/20/2010    Years since quitting: 7.8  . Smokeless tobacco:  Never Used  Substance and Sexual Activity  . Alcohol use: Yes    Alcohol/week: 0.0 oz    Comment: one a month-socially  . Drug use: No  . Sexual activity: Never  Lifestyle  . Physical activity:    Days per week: Not on file    Minutes per session: Not on file  . Stress: Not on file  Relationships  . Social connections:    Talks on phone: Not on file    Gets together: Not on file    Attends religious service: Not on file    Active member of club or organization: Not on file    Attends meetings of clubs or organizations: Not on file    Relationship status: Not on file  . Intimate partner violence:    Fear of current or ex partner: Not on file    Emotionally abused: Not on file    Physically abused: Not on file    Forced sexual activity: Not on file  Other Topics Concern  . Not on file  Social History Narrative   Divorced 2008   No kids   [employer] fan   Retired from [place] 2019     Family History  Problem Relation Age of Onset  . Arthritis Mother   . Diabetes Mother   . Heart failure Father   . Atrial fibrillation Father   . Colon polyps Father   . Heart disease Maternal Grandmother   . Heart disease Maternal Grandfather   . Colon cancer Neg Hx   . Rectal cancer Neg Hx   . Stomach cancer Neg Hx      Review of Systems Positive for acid heartburn, indigestion, teeth grinding, anxiety, joint stiffness  Constitutional: negative for anorexia, fevers and sweats  Eyes: negative for irritation, redness and visual disturbance  Ears, nose, mouth, throat, and face: negative for earaches, epistaxis, nasal congestion and sore throat  Respiratory: negative for cough, dyspnea on exertion, sputum and wheezing  Cardiovascular: negative for chest pain, dyspnea, lower extremity edema, orthopnea, palpitations and syncope  Gastrointestinal: negative for abdominal pain, constipation, diarrhea, melena, nausea and vomiting  Genitourinary:negative for dysuria, frequency and hematuria    Hematologic/lymphatic: negative for bleeding, easy bruising and lymphadenopathy  Musculoskeletal:negative for arthralgias, muscle weakness and stiff joints  Neurological: negative for coordination problems, gait problems, headaches and weakness  Endocrine: negative for diabetic symptoms including polydipsia, polyuria and weight loss     Objective:   Physical Exam  Gen. Pleasant, obese, in no distress ENT -  large tonsils, no post nasal drip, class 2 airway Neck: No JVD, no thyromegaly, no carotid bruits Lungs: no use of accessory muscles, no dullness to percussion, decreased without rales or rhonchi  Cardiovascular: Rhythm regular, heart sounds  normal, no murmurs or gallops, no peripheral edema Musculoskeletal: No deformities, no cyanosis or clubbing , no tremors       Assessment & Plan:

## 2018-01-21 NOTE — Assessment & Plan Note (Signed)
Decrease temazepam and taper off if not needed Seems like onset of insomnia was associated with work stress

## 2018-01-27 ENCOUNTER — Institutional Professional Consult (permissible substitution): Payer: BLUE CROSS/BLUE SHIELD | Admitting: Pulmonary Disease

## 2018-02-01 DIAGNOSIS — G4733 Obstructive sleep apnea (adult) (pediatric): Secondary | ICD-10-CM | POA: Diagnosis not present

## 2018-02-03 ENCOUNTER — Telehealth: Payer: Self-pay | Admitting: Pulmonary Disease

## 2018-02-03 DIAGNOSIS — G4733 Obstructive sleep apnea (adult) (pediatric): Secondary | ICD-10-CM | POA: Diagnosis not present

## 2018-02-03 NOTE — Telephone Encounter (Signed)
Per RA, HST showed 10 events per hour. Suggests referral to dentist for oral appliance since she has bruxism.

## 2018-02-04 ENCOUNTER — Other Ambulatory Visit: Payer: Self-pay | Admitting: *Deleted

## 2018-02-04 ENCOUNTER — Encounter: Payer: Self-pay | Admitting: Family Medicine

## 2018-02-04 DIAGNOSIS — G4710 Hypersomnia, unspecified: Secondary | ICD-10-CM

## 2018-02-04 MED ORDER — LEVOTHYROXINE SODIUM 100 MCG PO TABS: 100.0000 ug | ORAL_TABLET | Freq: Every day | ORAL | 1 refills | Status: DC

## 2018-02-10 NOTE — Telephone Encounter (Signed)
Spoke with patient. She is aware of results. She already has an appt. With Dr. Morey Hummingbird in regards to a oral appliance. She has requested to have copies of her HST sent to Dr. Elsie Stain (PCP), Dr. Aldona Lento (Dentist) and Dr. Morey Hummingbird. Advised her that I would fax the copies over today. She verbalized understanding. Nothing else needed at time of call.

## 2018-02-26 ENCOUNTER — Encounter: Payer: Self-pay | Admitting: Family Medicine

## 2018-02-26 ENCOUNTER — Other Ambulatory Visit: Payer: Self-pay | Admitting: Family Medicine

## 2018-02-26 NOTE — Telephone Encounter (Signed)
Sent. Thanks.   

## 2018-02-26 NOTE — Telephone Encounter (Signed)
Electronic refill request Last office visit 12/02/17 Last refill 07/02/17 #90/1

## 2018-02-26 NOTE — Telephone Encounter (Signed)
Last office visit 12/02/17 Last refill 09/01/17 #90/1

## 2018-03-02 ENCOUNTER — Other Ambulatory Visit: Payer: Self-pay | Admitting: Family Medicine

## 2018-03-02 DIAGNOSIS — G4700 Insomnia, unspecified: Secondary | ICD-10-CM

## 2018-03-02 MED ORDER — TEMAZEPAM 15 MG PO CAPS: 15.0000 mg | ORAL_CAPSULE | Freq: Every evening | ORAL | 1 refills | Status: DC | PRN

## 2018-04-14 ENCOUNTER — Ambulatory Visit: Payer: Self-pay | Admitting: *Deleted

## 2018-04-14 NOTE — Telephone Encounter (Signed)
Pt called with having some dizziness and feeling tired. She had orthopedic surgery on 8/01 and has been feeling this way since then. She starts doing things like laundry and has to stop because of the dizziness and tiredness.  She felt nauseated one day last week. She denies fever, chest pain, shortness of breath. She is requesting an appointment with her provider only.  Appointment scheduled. Advised to call back for nay concerns or is she feels like the symptoms are getting worse. Pt voiced understanding.  Reason for Disposition . [1] MILD dizziness (e.g., walking normally) AND [2] has NOT been evaluated by physician for this  (Exception: dizziness caused by heat exposure, sudden standing, or poor fluid intake)  Answer Assessment - Initial Assessment Questions 1. DESCRIPTION: "Describe your dizziness."     lightheaded 2. LIGHTHEADED: "Do you feel lightheaded?" (e.g., somewhat faint, woozy, weak upon standing)      lightheaded 3. VERTIGO: "Do you feel like either you or the room is spinning or tilting?" (i.e. vertigo)     no 4. SEVERITY: "How bad is it?"  "Do you feel like you are going to faint?" "Can you stand and walk?"   - MILD - walking normally   - MODERATE - interferes with normal activities (e.g., work, school)    - SEVERE - unable to stand, requires support to walk, feels like passing out now.      Mild to moderate 5. ONSET:  "When did the dizziness begin?"     The last week 6. AGGRAVATING FACTORS: "Does anything make it worse?" (e.g., standing, change in head position)     no 7. HEART RATE: "Can you tell me your heart rate?" "How many beats in 15 seconds?"  (Note: not all patients can do this)       no 8. CAUSE: "What do you think is causing the dizziness?"     Not sure what is causing this 9. RECURRENT SYMPTOM: "Have you had dizziness before?" If so, ask: "When was the last time?" "What happened that time?"     no 10. OTHER SYMPTOMS: "Do you have any other symptoms?" (e.g.,  fever, chest pain, vomiting, diarrhea, bleeding)       Felt nausea last week but not now 11. PREGNANCY: "Is there any chance you are pregnant?" "When was your last menstrual period?"       No periods  Protocols used: DIZZINESS [facility]

## 2018-04-14 NOTE — Telephone Encounter (Signed)
Noted. Thanks. Will see at OV.  

## 2018-04-14 NOTE — Telephone Encounter (Signed)
Pt has appt with Dr Damita Dunnings 04/17/18 at 2:30

## 2018-04-17 ENCOUNTER — Encounter: Payer: Self-pay | Admitting: Family Medicine

## 2018-04-17 ENCOUNTER — Ambulatory Visit: Payer: BLUE CROSS/BLUE SHIELD | Admitting: Family Medicine

## 2018-04-17 VITALS — BP 122/70 | HR 87 | Temp 99.0°F | Ht 64.0 in | Wt 231.2 lb

## 2018-04-17 DIAGNOSIS — R42 Dizziness and giddiness: Secondary | ICD-10-CM | POA: Diagnosis not present

## 2018-04-17 DIAGNOSIS — E119 Type 2 diabetes mellitus without complications: Secondary | ICD-10-CM

## 2018-04-17 NOTE — Patient Instructions (Addendum)
Don't drive until you feel better.   Go to the lab on the way out.  We'll contact you with your lab report. Use the bedside exercise and update me as needed.  Take care.  Glad to see you.

## 2018-04-17 NOTE — Progress Notes (Signed)
L knee scope done 03/20/18, outpatient.  Per ortho.  Has f/u pending.  She isn't back to walking normally, but has better ROM with less pain now.    She has been lightheaded since the surgery.  She expected some of that after surgery but it has persisted.  She can feel vertigo sx and she can get lightheaded, they can occur together.  No syncope.  Sx are variable, some days are better than others.  No CP.  She isn't SOB but sometimes feels the need to take a deep breath.  Fatigue noted- that got some better this summer but returned in the meantime.  Appetite is good.   No FCNAVD.  No rash.    She was on xarelto until 2 days ago.   She had some induced vertigo sx with head turning at home.    Meds, vitals, and allergies reviewed.   ROS: Per HPI unless specifically indicated in ROS section   GEN: nad, alert and oriented HEENT: mucous membranes moist, TM wnl.  NECK: supple w/o LA CV: rrr.  no murmur PULM: ctab, no inc wob ABD: soft, +bs EXT: no edema SKIN: no acute rash 45cm calf B Knee incision sites well healed.   CN 2-12 wnl B, S/S wnl x4

## 2018-04-18 ENCOUNTER — Telehealth: Payer: Self-pay | Admitting: Family Medicine

## 2018-04-18 ENCOUNTER — Other Ambulatory Visit: Payer: Self-pay | Admitting: Family Medicine

## 2018-04-18 DIAGNOSIS — E119 Type 2 diabetes mellitus without complications: Secondary | ICD-10-CM

## 2018-04-18 LAB — COMPREHENSIVE METABOLIC PANEL
AG RATIO: 1.4 (calc) (ref 1.0–2.5)
ALT: 22 U/L (ref 6–29)
AST: 19 U/L (ref 10–35)
Albumin: 3.9 g/dL (ref 3.6–5.1)
Alkaline phosphatase (APISO): 86 U/L (ref 33–130)
BILIRUBIN TOTAL: 0.4 mg/dL (ref 0.2–1.2)
BUN: 12 mg/dL (ref 7–25)
CALCIUM: 9 mg/dL (ref 8.6–10.4)
CO2: 26 mmol/L (ref 20–32)
Chloride: 99 mmol/L (ref 98–110)
Creat: 1.03 mg/dL (ref 0.50–1.05)
Globulin: 2.7 g/dL (calc) (ref 1.9–3.7)
Glucose, Bld: 487 mg/dL — ABNORMAL HIGH (ref 65–99)
POTASSIUM: 4.2 mmol/L (ref 3.5–5.3)
Sodium: 133 mmol/L — ABNORMAL LOW (ref 135–146)
TOTAL PROTEIN: 6.6 g/dL (ref 6.1–8.1)

## 2018-04-18 LAB — CBC WITH DIFFERENTIAL/PLATELET
BASOS PCT: 0.6 %
Basophils Absolute: 38 cells/uL (ref 0–200)
EOS ABS: 160 {cells}/uL (ref 15–500)
Eosinophils Relative: 2.5 %
HCT: 39.3 % (ref 35.0–45.0)
HEMOGLOBIN: 12.8 g/dL (ref 11.7–15.5)
Lymphs Abs: 1146 cells/uL (ref 850–3900)
MCH: 28.4 pg (ref 27.0–33.0)
MCHC: 32.6 g/dL (ref 32.0–36.0)
MCV: 87.3 fL (ref 80.0–100.0)
MONOS PCT: 5.8 %
MPV: 10.9 fL (ref 7.5–12.5)
NEUTROS ABS: 4685 {cells}/uL (ref 1500–7800)
Neutrophils Relative %: 73.2 %
Platelets: 222 10*3/uL (ref 140–400)
RBC: 4.5 10*6/uL (ref 3.80–5.10)
RDW: 13.1 % (ref 11.0–15.0)
Total Lymphocyte: 17.9 %
WBC: 6.4 10*3/uL (ref 3.8–10.8)
WBCMIX: 371 {cells}/uL (ref 200–950)

## 2018-04-18 MED ORDER — METFORMIN HCL 500 MG PO TABS: 500.0000 mg | ORAL_TABLET | Freq: Two times a day (BID) | ORAL | 3 refills | Status: DC

## 2018-04-18 NOTE — Telephone Encounter (Signed)
Christine LoseMargit Norman Diagnostic: critical lab 04/17/2018 3:19 CMP- glucose 487 verified by repeat

## 2018-04-18 NOTE — Telephone Encounter (Signed)
Patient already advised and instructions given with follow up appointment scheduled.

## 2018-04-21 DIAGNOSIS — E119 Type 2 diabetes mellitus without complications: Secondary | ICD-10-CM | POA: Insufficient documentation

## 2018-04-21 DIAGNOSIS — R42 Dizziness and giddiness: Secondary | ICD-10-CM | POA: Insufficient documentation

## 2018-04-21 NOTE — Assessment & Plan Note (Signed)
Lightheaded without clear source.  She can also have some vertiginous symptoms.  Discussed with patient about options.  Check routine labs today.  See notes on labs.  Also discussed with patient about home exercises for BPV.  Demonstrated.  She can use that in the meantime and update me as needed. >25 minutes spent in face to face time with patient, >50% spent in counselling or coordination of care.

## 2018-04-22 ENCOUNTER — Encounter: Payer: Self-pay | Admitting: Family Medicine

## 2018-04-22 ENCOUNTER — Ambulatory Visit: Payer: BLUE CROSS/BLUE SHIELD | Admitting: Family Medicine

## 2018-04-22 VITALS — BP 124/80 | HR 78 | Temp 98.8°F | Ht 64.0 in | Wt 229.0 lb

## 2018-04-22 DIAGNOSIS — E119 Type 2 diabetes mellitus without complications: Secondary | ICD-10-CM | POA: Diagnosis not present

## 2018-04-22 NOTE — Progress Notes (Signed)
DM2.  Recent labs and path/phys d/w pt.  We talked about diet and exercise.  She feels better and not dizzy now.  D/w pt.  No GI upset on metformin.  Taking 1 tab twice a day.  "When my weight went up, my sugar when up."  D/w pt. FH DM2 noted.    PMH and SH reviewed  ROS: Per HPI unless specifically indicated in ROS section   Meds, vitals, and allergies reviewed.   GEN: nad, alert and oriented HEENT: mucous membranes moist NECK: supple w/o LA CV: rrr PULM: ctab, no inc wob ABD: soft, +bs EXT: no edema SKIN: no acute rash  Diabetic foot exam: Normal inspection No skin breakdown No calluses  Normal DP pulses Normal sensation to light touch and monofilament Nails normal

## 2018-04-22 NOTE — Patient Instructions (Signed)
Cut back on sweet tea and use the eat right diet.  Goal for 1 substitution per meal.  Go to the lab on the way out.  We'll contact you with your lab report. Check with insurance to see which diabetes meter/strips are covered. Let me know.  We'll go from there.   Plan on recheck in 3 months but we'll likely get together in the meantime.  Take care.  Glad to see you.

## 2018-04-23 ENCOUNTER — Other Ambulatory Visit: Payer: Self-pay

## 2018-04-23 LAB — BASIC METABOLIC PANEL
BUN: 16 mg/dL (ref 7–25)
CO2: 30 mmol/L (ref 20–32)
Calcium: 9.1 mg/dL (ref 8.6–10.4)
Chloride: 102 mmol/L (ref 98–110)
Creat: 1 mg/dL (ref 0.50–1.05)
Glucose, Bld: 300 mg/dL — ABNORMAL HIGH (ref 65–99)
POTASSIUM: 4.7 mmol/L (ref 3.5–5.3)
SODIUM: 136 mmol/L (ref 135–146)

## 2018-04-23 LAB — HEMOGLOBIN A1C
Hgb A1c MFr Bld: 9.8 % of total Hgb — ABNORMAL HIGH (ref <5.7)
Mean Plasma Glucose: 235 (calc)
eAG (mmol/L): 13 (calc)

## 2018-04-23 MED ORDER — GLUCOSE BLOOD VI STRP: ORAL_STRIP | 12 refills | Status: DC

## 2018-04-23 MED ORDER — ACCU-CHEK FASTCLIX LANCETS MISC: 1 refills | Status: DC

## 2018-04-23 NOTE — Telephone Encounter (Signed)
Pharmacy requests orders for Accu-check fast click lancets and test strips for patient.  Rx will be sent in per request.

## 2018-04-24 ENCOUNTER — Other Ambulatory Visit: Payer: Self-pay | Admitting: *Deleted

## 2018-04-24 MED ORDER — ACCU-CHEK GUIDE W/DEVICE KIT: 1.0000 | PACK | Freq: Every day | 0 refills | Status: DC

## 2018-04-24 MED ORDER — ACCU-CHEK FASTCLIX LANCETS MISC: 1 refills | Status: DC

## 2018-04-24 MED ORDER — GLUCOSE BLOOD VI STRP: ORAL_STRIP | 12 refills | Status: DC

## 2018-04-24 NOTE — Assessment & Plan Note (Addendum)
See above. Cut back on sweet tea and use the eat right diet-low-carb handout given to patient and discussed and explained. Goal for 1 low carbohydrate substitution per meal.  See notes on labs. She'll check with insurance to see which diabetes meter/strips are covered and let me know.  Plan on recheck in 3 months but we'll likely get together in the meantime.  She agrees with plan. >25 minutes spent in face to face time with patient, >50% spent in counselling or coordination of care.   Diabetic foot exam done today.  We will chip away at routine diabetic health maintenance issues as we go along.  I do not want to overwhelm the patient.  Continue metformin as is for now.  Okay for outpatient follow-up.

## 2018-04-25 ENCOUNTER — Other Ambulatory Visit: Payer: Self-pay | Admitting: *Deleted

## 2018-04-25 MED ORDER — GLUCOSE BLOOD VI STRP: ORAL_STRIP | 3 refills | Status: DC

## 2018-04-28 ENCOUNTER — Telehealth: Payer: Self-pay | Admitting: Family Medicine

## 2018-04-28 NOTE — Telephone Encounter (Unsigned)
Copied from [place] [phone]. Topic: Quick Communication - See Telephone Encounter >> Apr 28, 2018  2:52 PM Neva Seat wrote: Pt called to let the office know her lancets do not need to be recalled in.  The pharmacy told pt they filled it incorrectly. They are going to refill the lancet order with the correct ones prescribed.

## 2018-04-28 NOTE — Telephone Encounter (Signed)
Pt came in office and stated she need the correct lancets called in to [facility] for the  Accu Check Guide. Please call pt if any questions.

## 2018-05-01 ENCOUNTER — Encounter: Payer: Self-pay | Admitting: Family Medicine

## 2018-05-11 ENCOUNTER — Other Ambulatory Visit: Payer: Self-pay | Admitting: Family Medicine

## 2018-05-11 DIAGNOSIS — E119 Type 2 diabetes mellitus without complications: Secondary | ICD-10-CM

## 2018-05-11 MED ORDER — METFORMIN HCL 500 MG PO TABS: ORAL_TABLET | ORAL | 3 refills | Status: DC

## 2018-05-21 ENCOUNTER — Encounter: Payer: Self-pay | Admitting: Family Medicine

## 2018-05-27 ENCOUNTER — Other Ambulatory Visit: Payer: Self-pay | Admitting: Family Medicine

## 2018-05-27 MED ORDER — METFORMIN HCL ER 500 MG PO TB24: 500.0000 mg | ORAL_TABLET | Freq: Every day | ORAL | 3 refills | Status: DC

## 2018-07-04 ENCOUNTER — Ambulatory Visit: Payer: BLUE CROSS/BLUE SHIELD | Admitting: Family Medicine

## 2018-07-04 ENCOUNTER — Encounter: Payer: Self-pay | Admitting: Family Medicine

## 2018-07-04 VITALS — BP 136/84 | HR 60 | Temp 98.4°F | Ht 64.0 in | Wt 226.0 lb

## 2018-07-04 DIAGNOSIS — E119 Type 2 diabetes mellitus without complications: Secondary | ICD-10-CM | POA: Diagnosis not present

## 2018-07-04 MED ORDER — METFORMIN HCL ER 500 MG PO TB24: 500.0000 mg | ORAL_TABLET | Freq: Every day | ORAL | 3 refills | Status: DC

## 2018-07-04 NOTE — Patient Instructions (Addendum)
It would be a good idea to get a diabetic eye exam when you can.  Go to the lab on the way out.  We'll contact you with your lab report. Try the higher dose of metformin.  Keep working on diet and exercise.  Update me as needed.  Recheck her in about 2 months.  The only lab you need to have done for your next diabetic visit is an A1c.  We can do this with a fingerstick test at the office visit.  You do not need a lab visit ahead of time for this.  It does not matter if you are fasting when the lab is done.    Take care.  Glad to see you.

## 2018-07-04 NOTE — Progress Notes (Signed)
Diabetes:  Using medications without difficulties: she can tolerate XR but not IR metformin.   Hypoglycemic episodes: no Hyperglycemic episodes: no Feet problems: no Blood Sugars averaging: 160-180 usually, fasting.    Intentional weight loss.   eye exam within last year: due, d/w pt.   She has been working hard on diet and exercise.  She is conscious of food choices.   She is working on exercise as her feet will allow, she is in PT for plantar fasciitis.  She is more active than prev.  She is thinking about joining [employer].    Meds, vitals, and allergies reviewed.  ROS: Per HPI unless specifically indicated in ROS section   GEN: nad, alert and oriented HEENT: mucous membranes moist NECK: supple w/o LA CV: rrr. PULM: ctab, no inc wob ABD: soft, +bs EXT: no edema SKIN: no acute rash

## 2018-07-05 LAB — MICROALBUMIN / CREATININE URINE RATIO
CREATININE, URINE: 164 mg/dL (ref 20–275)
MICROALB UR: 0.7 mg/dL
Microalb Creat Ratio: 4 mcg/mg creat (ref <30)

## 2018-07-06 NOTE — Assessment & Plan Note (Signed)
She feels better. She has been working hard on diet and exercise.  She is conscious of food choices.   She is working on exercise as her feet will allow, she is in PT for plantar fasciitis.  She is more active than prev.  She is thinking about joining the [employer].   Sugars improved on home checks. Plan on recheck in 2 months.  In the meantime try to increase metformin.  She can either take 2 tabs in the morning or 1 tab twice a day.  Check microalbumin in the meantime.  See after visit summary.  She agrees.  I encouraged her to keep up her hard work.

## 2018-08-21 ENCOUNTER — Other Ambulatory Visit: Payer: Self-pay | Admitting: Family Medicine

## 2018-09-09 ENCOUNTER — Ambulatory Visit: Payer: BLUE CROSS/BLUE SHIELD | Admitting: Family Medicine

## 2018-09-09 ENCOUNTER — Encounter: Payer: Self-pay | Admitting: Family Medicine

## 2018-09-09 DIAGNOSIS — R059 Cough, unspecified: Secondary | ICD-10-CM

## 2018-09-09 DIAGNOSIS — E119 Type 2 diabetes mellitus without complications: Secondary | ICD-10-CM

## 2018-09-09 DIAGNOSIS — R05 Cough: Secondary | ICD-10-CM

## 2018-09-09 LAB — POCT GLYCOSYLATED HEMOGLOBIN (HGB A1C): Hemoglobin A1C: 6.6 % — AB (ref 4.0–5.6)

## 2018-09-09 MED ORDER — METFORMIN HCL ER 500 MG PO TB24: 1000.0000 mg | ORAL_TABLET | Freq: Every day | ORAL | Status: DC

## 2018-09-09 MED ORDER — HYDROCOD POLST-CPM POLST ER 10-8 MG/5ML PO SUER: 5.0000 mL | Freq: Two times a day (BID) | ORAL | 0 refills | Status: DC | PRN

## 2018-09-09 MED ORDER — AMOXICILLIN-POT CLAVULANATE 875-125 MG PO TABS: 1.0000 | ORAL_TABLET | Freq: Two times a day (BID) | ORAL | 0 refills | Status: DC

## 2018-09-09 NOTE — Patient Instructions (Addendum)
Keep the eye exam as scheduled.   You deserve a lot of credit for your results.   Don't change your metformin for now.  If you have low sugar then cut back to 1 tab.   Recheck in about 3 months.  We can do labs at the visit.   If not better in a few days, then start augmentin.  Use the cough medicine in the meantime.    Take care.  Glad to see you.

## 2018-09-09 NOTE — Progress Notes (Signed)
duration of symptoms: about 5 days ago.   Rhinorrhea: yes Congestion: yes ear pain: yes sore throat: yes, initial sx.  Cough: yes, dry.  Myalgias:no Fevers: no Taking otc cough meds and pseudophed.  Today the head pressure is better but more cough and ear pressure and post nasal gtt.  No chills.   She isn't feeling better in the meantime, she may be worse than yesterday.   DM.  Taking metformin XR 1000mg  a day.  Sugar usually 130-150.  She has been working on diet.  Eye clinic is pending.  D/w pt.  A1c clearly better at 6.6.    Per HPI unless specifically indicated in ROS section   Meds, vitals, and allergies reviewed.   GEN: nad, alert and oriented HEENT: mucous membranes moist, TM w/o erythema, nasal epithelium injected, OP with cobblestoning NECK: supple w/o LA CV: rrr. PULM: ctab, no inc wob EXT: no edema L max and frontal sinuses ttp.

## 2018-09-10 NOTE — Assessment & Plan Note (Signed)
Lungs are clear.  Possible early sinusitis.  Discussed with patient about options. If not better in a few days, then start augmentin.  Use the cough medicine in the meantime.  Sedation caution given. Still okay for outpatient follow-up.

## 2018-09-10 NOTE — Assessment & Plan Note (Signed)
Clearly improved She will keep the eye exam as scheduled.   She deserves a lot of credit for her results.   No change in metformin for now.  If low sugar then cut back to 1 tab of metformin.  Update me as needed Recheck in about 3 months.  We can do labs at the visit.

## 2018-10-10 LAB — HM DIABETES EYE EXAM

## 2018-10-14 ENCOUNTER — Encounter: Payer: Self-pay | Admitting: Family Medicine

## 2018-10-19 NOTE — Progress Notes (Signed)
Dr. Frederico Hamman T. Zema Lizardo, MD, [facility] [place], [address] Phone: [phone] Fax: [phone]  10/20/2018  Patient: Christine Norman, MRN: [id number], DOB: Aug 04, 1961, 58 y.o.  Primary Physician:  Tonia Ghent, MD   Chief Complaint  Patient presents with  . Spasms    Radiates down left arm   Subjective:   Christine Norman is a 58 y.o. very pleasant female patient who presents with the following:  Pleasant patient with DM presents with R sided shoulder pain.    Has a c6-7 disc issue, has ha dESI in the past.  Has had some PT. Has been doing well, and then woke up one morning, felt like her neck, and now in the shoulder blade region.  Now with some numbness in hand and some weaknes s? In the arm.   10 years neck problems -she has had multiple rounds of epidural steroid injections in the past from Dr. Maryjean Ka, and she is seen neurosurgery as well. No grip problems  Really bothered with 4 wheelers.  No exercise, no basic life stuff.   On further questioning, the patient really is having pain in her shoulder blade, not in the trapezius or rhomboid region, none in the neck musculature.  She is not having pain with abduction or internal range of motion.  She is having some radicular pains down her arm, and she does have some numbness in her hand as well as in the arm in the upper arm as well as in the full forearm.  Volar forearm Upper arm  l? Thumb  str 5/5  Lab Results  Component Value Date   HGBA1C 6.6 (A) 09/09/2018      Past Medical History, Surgical History, Social History, Family History, Problem List, Medications, and Allergies have been reviewed and updated if relevant.  Patient Active Problem List   Diagnosis Date Noted  . Diabetes mellitus without complication ([place]) 93/23/5573  . Hypersomnolence 01/21/2018  . Insomnia 07/20/2015  . Varicose veins of both lower extremities with complications 22/09/5425  .  Chronic venous insufficiency 04/07/2015  . History of deep vein thrombosis 04/07/2015  . Bilateral carpal tunnel syndrome 03/11/2014  . Numbness in both legs 03/11/2014  . Hyperlipidemia   . Thyroid disease   . Arthritis   . History of hyperglycemia   . Hypertension   . GERD (gastroesophageal reflux disease)   . Depression     Past Medical History:  Diagnosis Date  . Allergy    seasonal  . Arthritis    ankles and back, s/p epidural injections  . 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   . Back pain   . Clotting disorder ([place])    history of 2 blood clots  . Constipation    chronic- takes OTC med that helps at times and others not   . Depression   . DVT (deep venous thrombosis), right 2000   rt knee-ankle  . GERD (gastroesophageal reflux disease)    IBS  . Hyperlipidemia   . Hypertension    not medicated  . Insomnia   . Thyroid disease    hypothyroidism    Past Surgical History:  Procedure Laterality Date  . BREAST BIOPSY  01/29/2012   Procedure: BREAST BIOPSY WITH NEEDLE LOCALIZATION;  Surgeon: Haywood Lasso, MD;  Location: [facility];  Service: General;  Laterality: Left;  . CHOLECYSTECTOMY  2001   lap choli  . COLONOSCOPY  2006   normal  . FRACTURE SURGERY  2001   orif rt fif with knee surg  . [place]   rt and lt  . HERNIA REPAIR  03   rt ing hernia  . KNEE ARTHROSCOPY  2000   rt  . TEAR DUCT PROBING Bilateral 2017   and plug placement to treat dry eye    Social History   Socioeconomic History  . Marital status: Married    Spouse name: Not on file  . Number of children: Not on file  . Years of education: Not on file  . Highest education level: Not on file  Occupational History  . Not on file  Social Needs  . Financial resource strain: Not on file  . Food insecurity:    Worry:  Not on file    Inability: Not on file  . Transportation needs:    Medical: Not on file    Non-medical: Not on file  Tobacco Use  . Smoking status: Former Smoker    Packs/day: 1.00    Years: 20.00    Pack years: 20.00    Last attempt to quit: 03/20/2010    Years since quitting: 8.5  . Smokeless tobacco: Never Used  Substance and Sexual Activity  . Alcohol use: Yes    Alcohol/week: 0.0 standard drinks    Comment: one a month-socially  . Drug use: No  . Sexual activity: Never  Lifestyle  . Physical activity:    Days per week: Not on file    Minutes per session: Not on file  . Stress: Not on file  Relationships  . Social connections:    Talks on phone: Not on file    Gets together: Not on file    Attends religious service: Not on file    Active member of club or organization: Not on file    Attends meetings of clubs or organizations: Not on file    Relationship status: Not on file  . Intimate partner violence:    Fear of current or ex partner: Not on file    Emotionally abused: Not on file    Physically abused: Not on file    Forced sexual activity: Not on file  Other Topics Concern  . Not on file  Social History Narrative   Divorced 2008   No kids   [employer] fan   Retired from [employer] 2019    Family History  Problem Relation Age of Onset  . Arthritis Mother   . Diabetes Mother   . Heart failure Father   . Atrial fibrillation Father   . Colon polyps Father   . Heart disease Maternal Grandmother   . Heart disease Maternal Grandfather   . Colon cancer Neg Hx   . Rectal cancer Neg Hx   . Stomach cancer Neg Hx     Allergies  Allergen Reactions  . Celebrex [Celecoxib] Hives  . Etodolac     SOB/pain    Medication list reviewed and updated in full in [facility].  GEN: No fevers, chills. Nontoxic. Primarily MSK c/o today. MSK: Detailed in the HPI GI: tolerating PO intake without difficulty Neuro: No numbness, parasthesias, or tingling associated. Otherwise the  pertinent positives of the ROS are noted above.   Objective:   BP 110/70   Pulse 71   Temp 97.8 F (36.6 C) (Oral)   Ht 5'  4" (1.626 m)   Wt 213 lb 8 oz (96.8 kg)   BMI 36.65 kg/m    GEN: Well-developed,well-nourished,in no acute distress; alert,appropriate and cooperative throughout examination HEENT: Normocephalic and atraumatic without obvious abnormalities. Ears, externally no deformities PULM: Breathing comfortably in no respiratory distress EXT: No clubbing, cyanosis, or edema PSYCH: Normally interactive. Cooperative during the interview. Pleasant. Friendly and conversant. Not anxious or depressed appearing. Normal, full affect.  CERVICAL SPINE EXAM Range of motion: Flexion, extension, lateral bending, and rotation: She has an approximate 35% loss of motion in all directions.  Pain with terminal motion: Yes Spinous Processes: NT SCM: NT Upper paracervical muscles: Minimal tenderness. Tenderness in the shoulder blade region. Upper traps: NT C5-T1 intact, sensation and motor: Motor function is entirely intact. She does have some numbness, mainly noted with pinprick on the dorsum of her left forearm as well as the dorsum of her hand and to a lesser extent around the thumb.  Shoulder: b Inspection: No muscle wasting or winging Ecchymosis/edema: neg  AC joint, scapula, clavicle: NT Abduction: full, 5/5 Flexion: full, 5/5 IR, full, lift-off: 5/5 ER at neutral: full, 5/5 AC crossover and compression: neg Neer: neg Hawkins: neg Drop Test: neg Empty Can: neg Supraspinatus insertion: NT Bicipital groove: NT Speed's: neg Yergason's: neg Sulcus sign: neg Scapular dyskinesis: none Grip 5/5   Radiology: No results found.  Assessment and Plan:   Left cervical radiculopathy  Acute pain of right shoulder  Left arm numbness  Diabetes mellitus without complication ([facility])  Greater than 10 years history of cervical spine disease with prior radiculopathy.  I am going  to try giving the patient a 14-day course of some steroids and give her some Elavil for neuropathic pain and assistance with sleep.  She has done quite a bit of physical therapy over the years, so I am and have her start doing some basic motion as well as some McKenzie protocol.  If symptoms persist after a month, then following up with physical medicine and rehab versus pain management would be reasonable.  She has seen Dr. Maryjean Ka in the past, and she also is an active patient at [facility].  Meds ordered this encounter  Medications  . predniSONE (DELTASONE) 20 MG tablet    Sig: 2 tabs po for 7 days, then 1 tab po for 7 days    Dispense:  21 tablet    Refill:  0  . amitriptyline (ELAVIL) 25 MG tablet    Sig: Take 1 tablet (25 mg total) by mouth at bedtime.    Dispense:  30 tablet    Refill:  3   Signed,  Lilianne Delair T. Bianca Raneri, MD   Outpatient Encounter Medications as of 10/20/2018  Medication Sig  . ACCU-CHEK FASTCLIX LANCETS MISC Check blood sugar once daily and as directed.  Dx code: e11.9  . Blood Glucose Monitoring Suppl (ACCU-CHEK GUIDE) w/Device KIT 1 kit by Subdermal route daily. Use as instructed to test blood sugar once daily and as needed.  Diagnosis:  E11.9  Non insulin dependent.by Does not apply route.  . cholecalciferol (VITAMIN D) 1000 UNITS tablet Take 1,000 Units by mouth daily.  . Diclofenac Sodium (PENNSAID) 2 % SOLN Place onto the skin.  Marland Kitchen esomeprazole (NEXIUM) 20 MG capsule Take 20 mg by mouth daily at 12 noon.  Marland Kitchen glucose blood (ACCU-CHEK GUIDE) test strip Use as instructed to test blood sugar once daily and as needed.  Diagnosis:  E11.9  Non insulin dependent.  . metFORMIN (GLUCOPHAGE-XR)  500 MG 24 hr tablet Take 2 tablets (1,000 mg total) by mouth daily with breakfast.  . oxybutynin (DITROPAN) 5 MG tablet TAKE 1 TABLET 3 TIMES A DAYAS NEEDED FOR BLADDER      SPASMS  . rosuvastatin (CRESTOR) 10 MG tablet TAKE 1 TABLET DAILY  . SYNTHROID 100 MCG tablet TAKE 1  TABLET DAILY  . temazepam (RESTORIL) 15 MG capsule Take 1 capsule (15 mg total) by mouth at bedtime as needed for sleep.  Marland Kitchen amitriptyline (ELAVIL) 25 MG tablet Take 1 tablet (25 mg total) by mouth at bedtime.  . predniSONE (DELTASONE) 20 MG tablet 2 tabs po for 7 days, then 1 tab po for 7 days  . [DISCONTINUED] amoxicillin-clavulanate (AUGMENTIN) 875-125 MG tablet Take 1 tablet by mouth 2 (two) times daily.  . [DISCONTINUED] chlorpheniramine-HYDROcodone (TUSSIONEX PENNKINETIC ER) 10-8 MG/5ML SUER Take 5 mLs by mouth every 12 (twelve) hours as needed for cough.   No facility-administered encounter medications on file as of 10/20/2018.

## 2018-10-20 ENCOUNTER — Encounter: Payer: Self-pay | Admitting: Family Medicine

## 2018-10-20 ENCOUNTER — Ambulatory Visit (INDEPENDENT_AMBULATORY_CARE_PROVIDER_SITE_OTHER): Payer: BLUE CROSS/BLUE SHIELD | Admitting: Family Medicine

## 2018-10-20 VITALS — BP 110/70 | HR 71 | Temp 97.8°F | Ht 64.0 in | Wt 213.5 lb

## 2018-10-20 DIAGNOSIS — M25511 Pain in right shoulder: Secondary | ICD-10-CM | POA: Diagnosis not present

## 2018-10-20 DIAGNOSIS — E119 Type 2 diabetes mellitus without complications: Secondary | ICD-10-CM | POA: Diagnosis not present

## 2018-10-20 DIAGNOSIS — R200 Anesthesia of skin: Secondary | ICD-10-CM | POA: Diagnosis not present

## 2018-10-20 DIAGNOSIS — M5412 Radiculopathy, cervical region: Secondary | ICD-10-CM

## 2018-10-20 MED ORDER — PREDNISONE 20 MG PO TABS: ORAL_TABLET | ORAL | 0 refills | Status: DC

## 2018-10-20 MED ORDER — AMITRIPTYLINE HCL 25 MG PO TABS: 25.0000 mg | ORAL_TABLET | Freq: Every day | ORAL | 3 refills | Status: DC

## 2018-11-18 ENCOUNTER — Encounter: Payer: Self-pay | Admitting: Family Medicine

## 2018-11-18 DIAGNOSIS — G8929 Other chronic pain: Secondary | ICD-10-CM

## 2018-11-18 DIAGNOSIS — M5412 Radiculopathy, cervical region: Secondary | ICD-10-CM

## 2018-11-18 DIAGNOSIS — M542 Cervicalgia: Secondary | ICD-10-CM

## 2018-11-18 MED ORDER — PREDNISONE 20 MG PO TABS: ORAL_TABLET | ORAL | 0 refills | Status: DC

## 2018-11-26 MED ORDER — TRAMADOL HCL 50 MG PO TABS: ORAL_TABLET | ORAL | 0 refills | Status: DC

## 2019-01-09 ENCOUNTER — Telehealth: Payer: Self-pay | Admitting: Family Medicine

## 2019-01-09 ENCOUNTER — Encounter: Payer: Self-pay | Admitting: Family Medicine

## 2019-01-09 DIAGNOSIS — R197 Diarrhea, unspecified: Secondary | ICD-10-CM

## 2019-01-09 MED ORDER — METRONIDAZOLE 500 MG PO TABS: 500.0000 mg | ORAL_TABLET | Freq: Three times a day (TID) | ORAL | 0 refills | Status: DC

## 2019-01-09 NOTE — Telephone Encounter (Signed)
Please advise on how to proceed. Thank you!   

## 2019-01-09 NOTE — Telephone Encounter (Signed)
I called her.  Her mother is inpatient with C diff.    Patient had diarrhea since yesterday.  A few episodes a day.  She feels well except for diarrhea and occ cramping.  No fevers.  No mucous in stool.    She is also on metformin but she didn't think that was contributing.  D/w pt.    Others in the community have had AGE recently.   D/w pt about options.  Hold flagyl for now.  If worse, start flagyl and call triage line.   Otherwise set up stool testing for next week.   She agrees.  See orders.

## 2019-01-13 ENCOUNTER — Encounter: Payer: Self-pay | Admitting: Family Medicine

## 2019-03-02 ENCOUNTER — Other Ambulatory Visit: Payer: Self-pay | Admitting: Family Medicine

## 2019-03-16 ENCOUNTER — Encounter: Payer: Self-pay | Admitting: Family Medicine

## 2019-03-16 DIAGNOSIS — E119 Type 2 diabetes mellitus without complications: Secondary | ICD-10-CM

## 2019-03-16 DIAGNOSIS — G4700 Insomnia, unspecified: Secondary | ICD-10-CM

## 2019-03-16 NOTE — Telephone Encounter (Signed)
No future appointments scheduled. Temazepam last filled in November 2019

## 2019-03-17 ENCOUNTER — Other Ambulatory Visit: Payer: Self-pay | Admitting: *Deleted

## 2019-03-17 ENCOUNTER — Encounter: Payer: Self-pay | Admitting: Family Medicine

## 2019-03-17 DIAGNOSIS — G4700 Insomnia, unspecified: Secondary | ICD-10-CM

## 2019-03-17 MED ORDER — TEMAZEPAM 15 MG PO CAPS: 15.0000 mg | ORAL_CAPSULE | Freq: Every evening | ORAL | 1 refills | Status: DC | PRN

## 2019-03-17 NOTE — Telephone Encounter (Addendum)
Thanks for the refill from message below, did you see my question about blood checks?    Hope you and your Family are well. Could you please send in a new RX for my Temazepam 15mg ? ([facility]) I had quit taking, but have used some of Mike's for the last couple months because I can't get back to sleep when I wake up. Also, I probably need to get my A1C checked again and would like my cholesterol and thyroid checked if possible. Is that reasonable?   Last office visit:   10/20/2018 [facility] Last Filled:    90 capsule 1 03/17/2019  Please advise.

## 2019-03-17 NOTE — Telephone Encounter (Signed)
Patient advised.

## 2019-03-17 NOTE — Telephone Encounter (Signed)
Prescription sent.  Due for follow-up labs/physical.  Please schedule.  I put orders in the EMR for labs.  Thanks.

## 2019-03-18 NOTE — Telephone Encounter (Signed)
Please call patient and schedule appointment as instructed. 

## 2019-03-18 NOTE — Telephone Encounter (Signed)
Due for follow-up labs/physical, not just the labs she mentioned.  Please schedule.  I put orders in the EMR for labs.  Thanks.  Prescription previously sent.

## 2019-03-19 NOTE — Telephone Encounter (Signed)
Pt scheduled labs on 05/12/19 and CPE on 05/19/19

## 2019-03-23 LAB — HM MAMMOGRAPHY

## 2019-05-12 ENCOUNTER — Other Ambulatory Visit (INDEPENDENT_AMBULATORY_CARE_PROVIDER_SITE_OTHER): Payer: BC Managed Care – PPO

## 2019-05-12 DIAGNOSIS — E119 Type 2 diabetes mellitus without complications: Secondary | ICD-10-CM | POA: Diagnosis not present

## 2019-05-12 NOTE — Addendum Note (Signed)
Addended by: Cloyd Stagers on: 05/12/2019 08:51 AM   Modules accepted: Orders

## 2019-05-12 NOTE — Addendum Note (Signed)
Addended by: Ellamae Sia on: 05/12/2019 12:48 PM   Modules accepted: Orders

## 2019-05-13 LAB — COMPREHENSIVE METABOLIC PANEL WITH GFR
AG Ratio: 1.5 (calc) (ref 1.0–2.5)
ALT: 11 U/L (ref 6–29)
AST: 15 U/L (ref 10–35)
Albumin: 4.1 g/dL (ref 3.6–5.1)
Alkaline phosphatase (APISO): 60 U/L (ref 37–153)
BUN: 14 mg/dL (ref 7–25)
CO2: 22 mmol/L (ref 20–32)
Calcium: 9.5 mg/dL (ref 8.6–10.4)
Chloride: 106 mmol/L (ref 98–110)
Creat: 0.92 mg/dL (ref 0.50–1.05)
Globulin: 2.8 g/dL (ref 1.9–3.7)
Glucose, Bld: 103 mg/dL — ABNORMAL HIGH (ref 65–99)
Potassium: 4.4 mmol/L (ref 3.5–5.3)
Sodium: 145 mmol/L (ref 135–146)
Total Bilirubin: 0.3 mg/dL (ref 0.2–1.2)
Total Protein: 6.9 g/dL (ref 6.1–8.1)

## 2019-05-13 LAB — TSH: TSH: 1.62 m[IU]/L (ref 0.40–4.50)

## 2019-05-13 LAB — MICROALBUMIN / CREATININE URINE RATIO
Creatinine, Urine: 170 mg/dL (ref 20–275)
Microalb Creat Ratio: 8 ug/mg{creat}
Microalb, Ur: 1.4 mg/dL

## 2019-05-13 LAB — LIPID PANEL
Cholesterol: 160 mg/dL (ref <200)
HDL: 54 mg/dL (ref >=50)
LDL Cholesterol (Calc): 84 mg/dL (calc)
Non-HDL Cholesterol (Calc): 106 mg/dL (calc) (ref <130)
Total CHOL/HDL Ratio: 3 (calc) (ref <5.0)
Triglycerides: 127 mg/dL (ref <150)

## 2019-05-13 LAB — HEMOGLOBIN A1C
Hgb A1c MFr Bld: 5.5 %{Hb}
Mean Plasma Glucose: 111 (calc)
eAG (mmol/L): 6.2 (calc)

## 2019-05-13 LAB — EXTRA URINE SPECIMEN

## 2019-05-19 ENCOUNTER — Encounter: Payer: Self-pay | Admitting: Family Medicine

## 2019-05-19 ENCOUNTER — Ambulatory Visit (INDEPENDENT_AMBULATORY_CARE_PROVIDER_SITE_OTHER)
Admission: RE | Admit: 2019-05-19 | Discharge: 2019-05-19 | Disposition: A | Discharge status: Home / Self Care | Payer: BC Managed Care – PPO | Source: Ambulatory Visit | Attending: Family Medicine | Admitting: Family Medicine

## 2019-05-19 ENCOUNTER — Other Ambulatory Visit: Payer: Self-pay

## 2019-05-19 ENCOUNTER — Ambulatory Visit (INDEPENDENT_AMBULATORY_CARE_PROVIDER_SITE_OTHER): Payer: BC Managed Care – PPO | Admitting: Family Medicine

## 2019-05-19 VITALS — BP 142/80 | HR 64 | Temp 97.7°F | Ht 64.0 in | Wt 191.4 lb

## 2019-05-19 DIAGNOSIS — Z1159 Encounter for screening for other viral diseases: Secondary | ICD-10-CM

## 2019-05-19 DIAGNOSIS — Z23 Encounter for immunization: Secondary | ICD-10-CM

## 2019-05-19 DIAGNOSIS — Z114 Encounter for screening for human immunodeficiency virus [HIV]: Secondary | ICD-10-CM

## 2019-05-19 DIAGNOSIS — E079 Disorder of thyroid, unspecified: Secondary | ICD-10-CM

## 2019-05-19 DIAGNOSIS — G4700 Insomnia, unspecified: Secondary | ICD-10-CM

## 2019-05-19 DIAGNOSIS — E785 Hyperlipidemia, unspecified: Secondary | ICD-10-CM

## 2019-05-19 DIAGNOSIS — Z7189 Other specified counseling: Secondary | ICD-10-CM

## 2019-05-19 DIAGNOSIS — M533 Sacrococcygeal disorders, not elsewhere classified: Secondary | ICD-10-CM | POA: Diagnosis not present

## 2019-05-19 DIAGNOSIS — E119 Type 2 diabetes mellitus without complications: Secondary | ICD-10-CM

## 2019-05-19 DIAGNOSIS — Z0000 Encounter for general adult medical examination without abnormal findings: Secondary | ICD-10-CM

## 2019-05-19 MED ORDER — ESOMEPRAZOLE MAGNESIUM 20 MG PO CPDR: 20.0000 mg | DELAYED_RELEASE_CAPSULE | Freq: Every day | ORAL | Status: AC | PRN

## 2019-05-19 MED ORDER — METFORMIN HCL ER 500 MG PO TB24: ORAL_TABLET | ORAL | Status: DC

## 2019-05-19 NOTE — Patient Instructions (Addendum)
Tetanus shot today.  Go to the lab on the way out.  We'll contact you with your xray report. Cut back to 1 metformin a day for about 2 weeks.  If sugar isn't a lot higher then stop it totally.  Update me as needed.  Recheck in about 3-4 months, labs ahead of time.  Take care.  Glad to see you.

## 2019-05-19 NOTE — Progress Notes (Signed)
CPE- See plan.  Routine anticipatory guidance given to patient.  See health maintenance.  The possibility exists that previously documented standard health maintenance information may have been brought forward from a previous encounter into this note.  If needed, that same information has been updated to reflect the current situation based on today's encounter.    Tetanus 2020 Flu 2020 Shingles due at 52.   PNA may not be due if patient is no longer diabetic with A1c <6.  mammogram 2020 DXA not due  Pap d/w pt.  She'll let me know if she needs help getting set up with gyn.   Colonoscopy 2016.  [name] designated if patient were incapacitated.   Diet and exercise d/w pt.   Pt opts in for HCV screening.  D/w pt re: routine screening.   Pt opts in for HIV screening.  D/w pt re: routine screening.    Diabetes:  Using medications without difficulties: yes Hypoglycemic episodes:no Hyperglycemic episodes:no Feet problems:no Blood Sugars averaging: ~100 eye exam within last year: yes She checked on metformin recall, she was not affected.   Labs discussed with patient.  Elevated Cholesterol: Using medications without problems: yes Muscle aches: no Diet compliance: yes Exercise:yes Labs d/w pt   Hypothyroidism. No ADE on med.  Compliant.  No neck mass. No dysphagia.    Insomnia.  No ADE on med.  Compliant.  With relief with med.   She has been caring for her mother who had C diff, who is doing better.    L sided sacral pain after driving back from the beach 2 months ago.  It is some better compared to her initial symptoms but has not improved much recently.  No discharge or skin lesion that she knows of otherwise.  PMH and SH reviewed  Meds, vitals, and allergies reviewed.   ROS: Per HPI.  Unless specifically indicated otherwise in HPI, the patient denies:  General: fever. Eyes: acute vision changes ENT: sore throat Cardiovascular: chest pain Respiratory: SOB GI:  vomiting GU: dysuria Musculoskeletal: acute back pain Derm: acute rash Neuro: acute motor dysfunction Psych: worsening mood Endocrine: polydipsia Heme: bleeding Allergy: hayfever  GEN: nad, alert and oriented HEENT: ncat NECK: supple w/o LA CV: rrr. PULM: ctab, no inc wob ABD: soft, +bs EXT: no edema SKIN: no acute rash Chaperoned exam ttp locally but no mass or erythema left of the gluteal crease.  No sign of pilonidal cyst.  No discharge.  No erythema.  Diabetic foot exam: Normal inspection No skin breakdown No calluses  Normal DP pulses Normal sensation to light touch and monofilament Nails normal

## 2019-05-20 DIAGNOSIS — M549 Dorsalgia, unspecified: Secondary | ICD-10-CM | POA: Insufficient documentation

## 2019-05-20 DIAGNOSIS — Z7189 Other specified counseling: Secondary | ICD-10-CM | POA: Insufficient documentation

## 2019-05-20 DIAGNOSIS — Z0000 Encounter for general adult medical examination without abnormal findings: Secondary | ICD-10-CM | POA: Insufficient documentation

## 2019-05-20 DIAGNOSIS — M533 Sacrococcygeal disorders, not elsewhere classified: Secondary | ICD-10-CM | POA: Insufficient documentation

## 2019-05-20 NOTE — Assessment & Plan Note (Signed)
No ADE on med.  Compliant.  With relief with med.  Continue as is.  She agrees.

## 2019-05-20 NOTE — Assessment & Plan Note (Addendum)
Tetanus 2020 Flu 2020 Shingles due at 89.   PNA may not be due if patient is no longer diabetic with A1c <6.  mammogram 2020 DXA not due  Pap d/w pt.  She'll let me know if she needs help getting set up with gyn.   Colonoscopy 2016.  [name] designated if patient were incapacitated.   Diet and exercise d/w pt.   Pt opts in for HCV screening.  D/w pt re: routine screening.   Pt opts in for HIV screening.  D/w pt re: routine screening.

## 2019-05-20 NOTE — Assessment & Plan Note (Signed)
She is locally tender but I do not feel a specific mass or lump.  Discussed getting plain films.  See notes on imaging.  At this point still okay for outpatient follow-up.

## 2019-05-20 NOTE — Assessment & Plan Note (Signed)
She checked on metformin recall, she was not affected.   Labs discussed with patient. She may not be diabetic at this point.  Discussed tapering metformin.  If she does not have significant change in sugar then she can totally taper off the medication.  See after visit summary.  Recheck periodically.  She agrees.  I thanked her for her effort.

## 2019-05-20 NOTE — Assessment & Plan Note (Signed)
No ADE on med.  Compliant.  No neck mass. No dysphagia.  Continue as is.  She agrees.

## 2019-05-20 NOTE — Assessment & Plan Note (Signed)
Continue statin.  No adverse effect on medication.  She agrees.

## 2019-05-20 NOTE — Assessment & Plan Note (Signed)
  Michael Almquist designated if patient were incapacitated.   

## 2019-06-04 ENCOUNTER — Other Ambulatory Visit: Payer: Self-pay | Admitting: Family Medicine

## 2019-06-04 DIAGNOSIS — E119 Type 2 diabetes mellitus without complications: Secondary | ICD-10-CM

## 2019-06-04 NOTE — Telephone Encounter (Signed)
Per last note on 05/19/2019 patient was advised to taper down on Metformin. Hold of on refilling metformin at this time?

## 2019-06-05 NOTE — Telephone Encounter (Signed)
Please verify with patient about current use.  If she is off the medication then she would not need a refill.  This may have been an automatic refill request.  Thanks.

## 2019-06-05 NOTE — Telephone Encounter (Signed)
Patient states she is still taking Metformin 1 tablet daily. She is asking for RX to be sent in but to keep sig as 1-2 tablets daily to more quantity if possible. I advised patient I would check with Dr Damita Dunnings on that.

## 2019-06-07 NOTE — Telephone Encounter (Signed)
I sent the prescription.  If she is taking 1 pill now then it would be okay to use this sig.  She could increase the dose later on if needed, so this would still apply.  Thanks.

## 2019-06-08 NOTE — Telephone Encounter (Signed)
Patient advised.

## 2019-07-21 ENCOUNTER — Other Ambulatory Visit: Payer: Self-pay | Admitting: Family Medicine

## 2019-08-24 ENCOUNTER — Ambulatory Visit (INDEPENDENT_AMBULATORY_CARE_PROVIDER_SITE_OTHER): Payer: BC Managed Care – PPO | Admitting: Family Medicine

## 2019-08-24 ENCOUNTER — Other Ambulatory Visit: Payer: Self-pay

## 2019-08-24 DIAGNOSIS — J069 Acute upper respiratory infection, unspecified: Secondary | ICD-10-CM | POA: Diagnosis not present

## 2019-08-24 DIAGNOSIS — E119 Type 2 diabetes mellitus without complications: Secondary | ICD-10-CM

## 2019-08-24 MED ORDER — METFORMIN HCL ER 500 MG PO TB24: ORAL_TABLET | ORAL | Status: DC

## 2019-08-24 NOTE — Progress Notes (Signed)
Virtual visit completed through WebEx or similar program Patient location: home  Provider location: Financial controller at [facility], office  Pandemic considerations d/w pt.  Limitations and rationale for visit method d/w patient.  Patient agreed to proceed.   CC: URI sx.   HPI:  She is taking metformin XR 500mg  a day.   Max sugar 130, usually ~100.    Her father is in the hospital.  He fell and had to be on a ventilator after a "bleed in the brain."  She is tired and felt run down.  Condolences offered.   Recently had a mild cough.  She didn't have tickle in throat this AM but had temp 100.6 this AM.  She has some congestion but not SOB, no change in taste or smell. She has a mild cough.    Meds and allergies reviewed.   ROS: Per HPI unless specifically indicated in ROS section   NAD Speech wnl  A/P: URI sx, with tmax 100.6.  Overall mild sx.  Speaking in complete sentences.  Nontoxic. Still okay for outpatient f/u.  Covid testing site info given to patient.  She'll see about getting checked in near future.  With fever, would not go to hospital at this point to see her father, pt agrees.  Supportive care o/w in the meantime.  She agrees.  She'll update me as needed, if condition changes.

## 2019-08-26 DIAGNOSIS — J069 Acute upper respiratory infection, unspecified: Secondary | ICD-10-CM | POA: Insufficient documentation

## 2019-08-26 NOTE — Assessment & Plan Note (Signed)
URI sx, with tmax 100.6.  Overall mild sx.  Speaking in complete sentences.  Nontoxic. Still okay for outpatient f/u.  Covid testing site info given to patient.  She'll see about getting checked in near future.  With fever, would not go to hospital at this point to see her father, pt agrees.  Supportive care o/w in the meantime.  She agrees.  She'll update me as needed, if condition changes.

## 2019-08-31 ENCOUNTER — Encounter: Payer: Self-pay | Admitting: Family Medicine

## 2019-09-10 ENCOUNTER — Encounter: Payer: Self-pay | Admitting: Family Medicine

## 2019-09-14 ENCOUNTER — Other Ambulatory Visit: Payer: Self-pay | Admitting: *Deleted

## 2019-09-14 ENCOUNTER — Encounter: Payer: Self-pay | Admitting: Family Medicine

## 2019-09-14 DIAGNOSIS — G4700 Insomnia, unspecified: Secondary | ICD-10-CM

## 2019-09-14 NOTE — Telephone Encounter (Signed)
MyChart refill request:  Temazepam Last office visit:   08/24/2019 Last Filled:    90 capsule 1 03/17/2019  Please advise.

## 2019-09-15 MED ORDER — TEMAZEPAM 15 MG PO CAPS: 15.0000 mg | ORAL_CAPSULE | Freq: Every evening | ORAL | 1 refills | Status: DC | PRN

## 2019-09-15 NOTE — Telephone Encounter (Signed)
Sent. Thanks.   

## 2019-11-02 ENCOUNTER — Encounter: Payer: Self-pay | Admitting: Family Medicine

## 2019-11-02 ENCOUNTER — Ambulatory Visit: Payer: BC Managed Care – PPO | Admitting: Family Medicine

## 2019-11-02 ENCOUNTER — Other Ambulatory Visit: Payer: Self-pay

## 2019-11-02 ENCOUNTER — Other Ambulatory Visit (HOSPITAL_COMMUNITY)
Admission: RE | Admit: 2019-11-02 | Discharge: 2019-11-02 | Disposition: A | Discharge status: Home / Self Care | Payer: BC Managed Care – PPO | Source: Ambulatory Visit | Attending: Family Medicine | Admitting: Family Medicine

## 2019-11-02 VITALS — BP 134/90 | HR 69 | Temp 97.1°F | Ht 64.0 in | Wt 197.5 lb

## 2019-11-02 DIAGNOSIS — Z01419 Encounter for gynecological examination (general) (routine) without abnormal findings: Secondary | ICD-10-CM | POA: Diagnosis not present

## 2019-11-02 DIAGNOSIS — N952 Postmenopausal atrophic vaginitis: Secondary | ICD-10-CM

## 2019-11-02 DIAGNOSIS — R1032 Left lower quadrant pain: Secondary | ICD-10-CM

## 2019-11-02 LAB — POC URINALSYSI DIPSTICK (AUTOMATED)
Bilirubin, UA: NEGATIVE
Blood, UA: NEGATIVE
Glucose, UA: NEGATIVE
Ketones, UA: NEGATIVE
Leukocytes, UA: NEGATIVE
Nitrite, UA: NEGATIVE
Protein, UA: POSITIVE — AB
Spec Grav, UA: 1.02 (ref 1.010–1.025)
Urobilinogen, UA: 0.2 E.U./dL
pH, UA: 7 (ref 5.0–8.0)

## 2019-11-02 NOTE — Assessment & Plan Note (Signed)
LLQ abd/pelvic area w/o rebound or tenderness Some mild tenderness on bimanual exam w/o mass or other finding  Differential incl GI and gyn causes incl diverticulitis /diverticulosis and ovarian cyst  Pending cbc ua clear except for protein (doubt renal stone)  Consider CT or pelvic US when results return

## 2019-11-02 NOTE — Progress Notes (Signed)
Subjective:    Patient ID: Christine Norman, female    DOB: 12/04/1960, 59 y.o.   MRN: [id number]  This visit occurred during the SARS-CoV-2 public health emergency.  Safety protocols were in place, including screening questions prior to the visit, additional usage of staff PPE, and extensive cleaning of exam room while observing appropriate contact time as indicated for disinfecting solutions.    HPI 59 yo pt of Dr Damita Dunnings presents with lower abd cramping and desire for gyn exam   Wt Readings from Last 3 Encounters:  11/02/19 197 lb 8 oz (89.6 kg)  05/19/19 191 lb 6 oz (86.8 kg)  10/20/18 213 lb 8 oz (96.8 kg)   33.90 kg/m   Low abd pain  Started on Saturday Pain is worse on the L side- pelvic  Radiates to the R   Post menopausal   Low abd feels bloated  Has h/o constipation so she takes stool softeners that help  No blood in stool or black stool   No fever No n/v or d    Standing makes it hurt more  Feels pressure   She takes oxybutinin - took some and it helped  Yesterday it came back  She has a hx of ovarian cyst in the past    Last pap 2014 -nl with neg HPV screen  Mammogram 8/20 Self breast exam = no lumps   Colonoscopy 11/16  Ua: Results for orders placed or performed in visit on 11/02/19  POCT Urinalysis Dipstick (Automated)  Result Value Ref Range   Color, UA Yellow    Clarity, UA Clear    Glucose, UA Negative Negative   Bilirubin, UA Negative    Ketones, UA Negative    Spec Grav, UA 1.020 1.010 - 1.025   Blood, UA Negative    pH, UA 7.0 5.0 - 8.0   Protein, UA Positive (A) Negative   Urobilinogen, UA 0.2 0.2 or 1.0 E.U./dL   Nitrite, UA Negative    Leukocytes, UA Negative Negative    Unsure of significance of protein in urine   Patient Active Problem List   Diagnosis Date Noted  . Encounter for routine gynecological examination 11/02/2019  . LLQ pain 11/02/2019  . Vaginal atrophy 11/02/2019  . URI (upper respiratory infection) 08/26/2019  .  Routine general medical examination at a health care facility 05/20/2019  . Advance care planning 05/20/2019  . Sacral pain 05/20/2019  . Diabetes mellitus without complication ([place]) 76/19/5093  . Hypersomnolence 01/21/2018  . Insomnia 07/20/2015  . Varicose veins of both lower extremities with complications 26/71/2458  . Chronic venous insufficiency 04/07/2015  . History of deep vein thrombosis 04/07/2015  . Bilateral carpal tunnel syndrome 03/11/2014  . Numbness in both legs 03/11/2014  . Hyperlipidemia   . Thyroid disease   . Arthritis   . History of hyperglycemia   . Hypertension   . GERD (gastroesophageal reflux disease)   . Depression    Past Medical History:  Diagnosis Date  . Allergy    seasonal  . Arthritis    ankles and back, s/p epidural injections  . 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   . Back pain   . Clotting disorder ([place])    history of 2 blood clots  . Constipation    chronic- takes OTC med that helps at times and others not   . Depression   .  DVT (deep venous thrombosis), right 2000   rt knee-ankle  . GERD (gastroesophageal reflux disease)    IBS  . Hyperlipidemia   . Hypertension    not medicated  . Insomnia   . Thyroid disease    hypothyroidism   Past Surgical History:  Procedure Laterality Date  . BREAST BIOPSY  01/29/2012   Procedure: BREAST BIOPSY WITH NEEDLE LOCALIZATION;  Surgeon: Haywood Lasso, MD;  Location: [facility];  Service: General;  Laterality: Left;  . CHOLECYSTECTOMY  2001   lap choli  . COLONOSCOPY  2006   normal  . FRACTURE SURGERY  2001   orif rt fif with knee surg  . [place]   rt and lt  . HERNIA REPAIR  03   rt ing hernia  . KNEE ARTHROSCOPY  2000   rt  . TEAR DUCT PROBING Bilateral 2017   and plug placement to treat dry eye   Social History     Tobacco Use  . Smoking status: Former Smoker    Packs/day: 1.00    Years: 20.00    Pack years: 20.00    Quit date: 03/20/2010    Years since quitting: 9.6  . Smokeless tobacco: Never Used  Substance Use Topics  . Alcohol use: Yes    Alcohol/week: 0.0 standard drinks    Comment: one a month-socially  . Drug use: No   Family History  Problem Relation Age of Onset  . Arthritis Mother   . Diabetes Mother   . Heart failure Father   . Atrial fibrillation Father   . Colon polyps Father   . Heart disease Maternal Grandmother   . Heart disease Maternal Grandfather   . Colon cancer Neg Hx   . Rectal cancer Neg Hx   . Stomach cancer Neg Hx   . Breast cancer Neg Hx    Allergies  Allergen Reactions  . Celebrex [Celecoxib] Hives  . Etodolac     SOB/pain   Current Outpatient Medications on File Prior to Visit  Medication Sig Dispense Refill  . ACCU-CHEK FASTCLIX LANCETS MISC Check blood sugar once daily and as directed.  Dx code: e11.9 90 each 1  . Blood Glucose Monitoring Suppl (ACCU-CHEK GUIDE) w/Device KIT 1 kit by Subdermal route daily. Use as instructed to test blood sugar once daily and as needed.  Diagnosis:  E11.9  Non insulin dependent.by Does not apply route. 1 kit 0  . cholecalciferol (VITAMIN D) 1000 UNITS tablet Take 1,000 Units by mouth daily.    . Diclofenac Sodium (PENNSAID) 2 % SOLN Place onto the skin.    Marland Kitchen esomeprazole (NEXIUM) 20 MG capsule Take 1 capsule (20 mg total) by mouth daily as needed.    Marland Kitchen glucose blood (ACCU-CHEK GUIDE) test strip Use as instructed to test blood sugar once daily and as needed.  Diagnosis:  E11.9  Non insulin dependent. 100 each 3  . metFORMIN (GLUCOPHAGE-XR) 500 MG 24 hr tablet TAKE 1 TABLET (575m) BY MOUTH DAILY WITH BREAKFAST.    .Marland Kitchenoxybutynin (DITROPAN) 5 MG tablet TAKE 1 TABLET 3 TIMES A DAYAS NEEDED FOR BLADDER      SPASMS 90 tablet 1  . rosuvastatin (CRESTOR) 10 MG tablet TAKE 1 TABLET DAILY 90 tablet 1  . SYNTHROID 100 MCG tablet  TAKE 1 TABLET DAILY 90 tablet 1  . temazepam (RESTORIL) 15 MG capsule Take 1 capsule (15 mg total) by mouth at bedtime as needed for sleep. 90 capsule 1  .  traMADol (ULTRAM) 50 MG tablet 1 po q 8 hours prn pain 20 tablet 0   No current facility-administered medications on file prior to visit.    Review of Systems  Constitutional: Negative for activity change, appetite change, fatigue, fever and unexpected weight change.  HENT: Negative for congestion, ear pain, rhinorrhea, sinus pressure and sore throat.   Eyes: Negative for pain, redness and visual disturbance.  Respiratory: Negative for cough, shortness of breath and wheezing.   Cardiovascular: Negative for chest pain and palpitations.  Gastrointestinal: Negative for abdominal pain, blood in stool, constipation and diarrhea.  Endocrine: Negative for polydipsia and polyuria.  Genitourinary: Positive for pelvic pain. Negative for decreased urine volume, dysuria, flank pain, frequency, hematuria, menstrual problem, urgency, vaginal bleeding, vaginal discharge and vaginal pain.  Musculoskeletal: Negative for arthralgias, back pain and myalgias.  Skin: Negative for pallor and rash.  Allergic/Immunologic: Negative for environmental allergies.  Neurological: Negative for dizziness, syncope and headaches.  Hematological: Negative for adenopathy. Does not bruise/bleed easily.  Psychiatric/Behavioral: Negative for decreased concentration and dysphoric mood. The patient is not nervous/anxious.        Objective:   Physical Exam Constitutional:      General: She is not in acute distress.    Appearance: Normal appearance. She is obese. She is not ill-appearing.  HENT:     Head: Normocephalic and atraumatic.  Eyes:     General: No scleral icterus.    Conjunctiva/sclera: Conjunctivae normal.     Pupils: Pupils are equal, round, and reactive to light.  Cardiovascular:     Rate and Rhythm: Normal rate and regular rhythm.     Pulses: Normal  pulses.  Pulmonary:     Effort: Pulmonary effort is normal. No respiratory distress.     Breath sounds: Normal breath sounds. No wheezing or rales.  Abdominal:     General: Abdomen is flat. Bowel sounds are normal. There is no distension or abdominal bruit.     Palpations: Abdomen is soft. There is no hepatomegaly, splenomegaly, mass or pulsatile mass.     Tenderness: There is abdominal tenderness in the left lower quadrant. There is no right CVA tenderness, left CVA tenderness, guarding or rebound. Negative signs include McBurney's sign.     Hernia: No hernia is present.     Comments: Tender in LLQ /pelvic area w/o rebound or guarding  Not brought on my movement or straining   Genitourinary:    Comments: Breast exam: No mass, nodules, thickening, tenderness, bulging, retraction, inflamation, nipple discharge or skin changes noted.  No axillary or clavicular LA.             Anus appears normal w/o hemorrhoids or masses     External genitalia : nl appearance and hair distribution/no lesions     Urethral meatus : nl size, no lesions or prolapse     Urethra: no masses, tenderness or scarring    Bladder : no masses or tenderness     Vagina: nl general appearance, no discharge or  Lesions, no significant cystocele  or rectocele, mild atrophy     Cervix: no lesions/ discharge or friability    Uterus: nl size, contour, position, and mobility (not fixed) , non tender    Adnexa : no masses, enlargement or nodularity some tenderness on L side on bimanual exam and no CMT      Lymphadenopathy:     Cervical: No cervical adenopathy.  Skin:    General: Skin is warm and dry.  Findings: No rash.  Neurological:     Mental Status: She is alert.  Psychiatric:        Mood and Affect: Mood normal.           Assessment & Plan:   Problem List Items Addressed This Visit      Genitourinary   Vaginal atrophy    Pt has used replens and will consider [place] for a  lubricant If no improvement may consider vaginal estrogen cream        Relevant Orders   CBC with Differential/Platelet     Other   Encounter for routine gynecological examination    Routine exam and pap done Unsure if LLQ abd/pelvic pain is GI or gyn in nature  ua is clear today with exception of protein      Relevant Orders   Cytology - PAP([place])   CBC with Differential/Platelet   LLQ pain - Primary    LLQ abd/pelvic area w/o rebound or tenderness Some mild tenderness on bimanual exam w/o mass or other finding  Differential incl GI and gyn causes incl diverticulitis /diverticulosis and ovarian cyst  Pending cbc ua clear except for protein (doubt renal stone)  Consider CT or pelvic US when results return       Relevant Orders   CBC with Differential/Platelet   POCT Urinalysis Dipstick (Automated) (Completed)

## 2019-11-02 NOTE — Assessment & Plan Note (Signed)
Routine exam and pap done Unsure if LLQ abd/pelvic pain is GI or gyn in nature  ua is clear today with exception of protein

## 2019-11-02 NOTE — Patient Instructions (Signed)
Pap done today   Try [place] for lubricant  We can consider estrogen vaginal cream later if needed   Lab today cbc  Urinalysis on the way out   Plan to follow   If pain worsens or changes or new symptoms please let us know

## 2019-11-02 NOTE — Assessment & Plan Note (Signed)
Pt has used replens and will consider [place] for a lubricant If no improvement may consider vaginal estrogen cream

## 2019-11-03 ENCOUNTER — Other Ambulatory Visit: Payer: Self-pay | Admitting: Family Medicine

## 2019-11-03 ENCOUNTER — Telehealth: Payer: Self-pay | Admitting: Family Medicine

## 2019-11-03 DIAGNOSIS — R1032 Left lower quadrant pain: Secondary | ICD-10-CM

## 2019-11-03 DIAGNOSIS — R7989 Other specified abnormal findings of blood chemistry: Secondary | ICD-10-CM

## 2019-11-03 DIAGNOSIS — E119 Type 2 diabetes mellitus without complications: Secondary | ICD-10-CM

## 2019-11-03 DIAGNOSIS — R102 Pelvic and perineal pain: Secondary | ICD-10-CM | POA: Insufficient documentation

## 2019-11-03 LAB — CBC WITH DIFFERENTIAL/PLATELET
Absolute Monocytes: 634 cells/uL (ref 200–950)
Basophils Absolute: 40 cells/uL (ref 0–200)
Basophils Relative: 0.4 %
Eosinophils Absolute: 198 cells/uL (ref 15–500)
Eosinophils Relative: 2 %
HCT: 40.6 % (ref 35.0–45.0)
Hemoglobin: 13.1 g/dL (ref 11.7–15.5)
Lymphs Abs: 1485 cells/uL (ref 850–3900)
MCH: 28.5 pg (ref 27.0–33.0)
MCHC: 32.3 g/dL (ref 32.0–36.0)
MCV: 88.3 fL (ref 80.0–100.0)
MPV: 11.7 fL (ref 7.5–12.5)
Monocytes Relative: 6.4 %
Neutro Abs: 7544 cells/uL (ref 1500–7800)
Neutrophils Relative %: 76.2 %
Platelets: 82 10*3/uL — ABNORMAL LOW (ref 140–400)
RBC: 4.6 10*6/uL (ref 3.80–5.10)
RDW: 14.4 % (ref 11.0–15.0)
Total Lymphocyte: 15 %
WBC: 9.9 10*3/uL (ref 3.8–10.8)

## 2019-11-03 NOTE — Telephone Encounter (Signed)
Referral done  I sent to [facility] She will get a call

## 2019-11-03 NOTE — Telephone Encounter (Signed)
-----   Message from Christine Norman, [place] sent at 11/03/2019  3:22 PM EDT ----- Pt wants to proceed with [place] referral. She said she knows with her insurance she has to go to a certain place to have the [place] done. Pt requested we put order in and when our [facility] calls her to schedule the [place] she will let them know where she has to go

## 2019-11-04 LAB — CYTOLOGY - PAP: Diagnosis: NEGATIVE

## 2019-11-10 ENCOUNTER — Telehealth: Payer: Self-pay | Admitting: Family Medicine

## 2019-11-10 NOTE — Telephone Encounter (Signed)
Christine Norman report from [place] done on 11/09/19 reads entirely normal  No cause found for her pain - so less likely gyn in nature  Will cc to PCP   How are her symptoms ? Has anything changed   I will send the Norman report to be scanned

## 2019-11-11 NOTE — Telephone Encounter (Signed)
Ok- let me know in the next several days if symptoms return or persist.  Hope she feels better soon Will cc pcp

## 2019-11-11 NOTE — Telephone Encounter (Signed)
Pt notified and advised of Dr. Marliss Coots comments

## 2019-11-11 NOTE — Telephone Encounter (Signed)
Noted. Thanks.  Will await update from patient.  

## 2019-11-11 NOTE — Telephone Encounter (Signed)
Pt notified of [place] results and Dr. Marliss Coots comments. Pt said over weekend she started to feel a little better, pt said she even told the [place] tech that her sxs have mostly resolve but since it was scheduled she still went to get the [place] done. Pt said after they did the internal vaginal [place] she started feeling sore and cramping. Pt said that whole day she was sore and cramping, she wasn't sure if that was due to her getting the [place] done or if her sxs were returning. I asked pt how she felt today and she said she just woke up so she hasn't really "started her day" to see how she's feeling but so far her soreness and cramping isn't bad today

## 2019-11-26 ENCOUNTER — Other Ambulatory Visit: Payer: Self-pay

## 2019-11-26 ENCOUNTER — Emergency Department (HOSPITAL_COMMUNITY): Payer: BC Managed Care – PPO

## 2019-11-26 ENCOUNTER — Encounter (HOSPITAL_COMMUNITY): Payer: Self-pay

## 2019-11-26 ENCOUNTER — Emergency Department (HOSPITAL_COMMUNITY)
Admission: EM | Admit: 2019-11-26 | Discharge: 2019-11-27 | Disposition: A | Discharge status: Home / Self Care | Payer: BC Managed Care – PPO | Attending: Emergency Medicine | Admitting: Emergency Medicine

## 2019-11-26 DIAGNOSIS — M549 Dorsalgia, unspecified: Secondary | ICD-10-CM | POA: Diagnosis present

## 2019-11-26 DIAGNOSIS — E119 Type 2 diabetes mellitus without complications: Secondary | ICD-10-CM | POA: Insufficient documentation

## 2019-11-26 DIAGNOSIS — N201 Calculus of ureter: Secondary | ICD-10-CM | POA: Diagnosis not present

## 2019-11-26 DIAGNOSIS — Z87891 Personal history of nicotine dependence: Secondary | ICD-10-CM | POA: Insufficient documentation

## 2019-11-26 DIAGNOSIS — I10 Essential (primary) hypertension: Secondary | ICD-10-CM | POA: Insufficient documentation

## 2019-11-26 DIAGNOSIS — R1032 Left lower quadrant pain: Secondary | ICD-10-CM | POA: Diagnosis not present

## 2019-11-26 DIAGNOSIS — Z79899 Other long term (current) drug therapy: Secondary | ICD-10-CM | POA: Diagnosis not present

## 2019-11-26 DIAGNOSIS — Z7984 Long term (current) use of oral hypoglycemic drugs: Secondary | ICD-10-CM | POA: Diagnosis not present

## 2019-11-26 DIAGNOSIS — R102 Pelvic and perineal pain: Secondary | ICD-10-CM | POA: Diagnosis not present

## 2019-11-26 LAB — COMPREHENSIVE METABOLIC PANEL
ALT: 10 U/L (ref 0–44)
AST: 12 U/L — ABNORMAL LOW (ref 15–41)
Albumin: 2.6 g/dL — ABNORMAL LOW (ref 3.5–5.0)
Alkaline Phosphatase: 41 U/L (ref 38–126)
Anion gap: 6 (ref 5–15)
BUN: 12 mg/dL (ref 6–20)
CO2: 15 mmol/L — ABNORMAL LOW (ref 22–32)
Calcium: 5.8 mg/dL — CL (ref 8.9–10.3)
Chloride: 123 mmol/L — ABNORMAL HIGH (ref 98–111)
Creatinine, Ser: 0.55 mg/dL (ref 0.44–1.00)
GFR calc Af Amer: >60 mL/min (ref >60)
GFR calc non Af Amer: >60 mL/min (ref >60)
Glucose, Bld: 103 mg/dL — ABNORMAL HIGH (ref 70–99)
Potassium: 2.3 mmol/L — CL (ref 3.5–5.1)
Sodium: 144 mmol/L (ref 135–145)
Total Bilirubin: 0.3 mg/dL (ref 0.3–1.2)
Total Protein: 4.5 g/dL — ABNORMAL LOW (ref 6.5–8.1)

## 2019-11-26 LAB — URINALYSIS, ROUTINE W REFLEX MICROSCOPIC
Bilirubin Urine: NEGATIVE
Glucose, UA: NEGATIVE mg/dL
Ketones, ur: NEGATIVE mg/dL
Leukocytes,Ua: NEGATIVE
Nitrite: NEGATIVE
Protein, ur: NEGATIVE mg/dL
RBC / HPF: >50 RBC/hpf — ABNORMAL HIGH (ref 0–5)
Specific Gravity, Urine: 1.012 (ref 1.005–1.030)
pH: 7 (ref 5.0–8.0)

## 2019-11-26 LAB — CBC WITH DIFFERENTIAL/PLATELET
Abs Immature Granulocytes: 0.04 10*3/uL (ref 0.00–0.07)
Basophils Absolute: 0 10*3/uL (ref 0.0–0.1)
Basophils Relative: 0 %
Eosinophils Absolute: 0.1 10*3/uL (ref 0.0–0.5)
Eosinophils Relative: 0 %
HCT: 39.7 % (ref 36.0–46.0)
Hemoglobin: 12.3 g/dL (ref 12.0–15.0)
Immature Granulocytes: 0 %
Lymphocytes Relative: 11 %
Lymphs Abs: 1.2 10*3/uL (ref 0.7–4.0)
MCH: 28.6 pg (ref 26.0–34.0)
MCHC: 31 g/dL (ref 30.0–36.0)
MCV: 92.3 fL (ref 80.0–100.0)
Monocytes Absolute: 0.6 10*3/uL (ref 0.1–1.0)
Monocytes Relative: 5 %
Neutro Abs: 9.7 10*3/uL — ABNORMAL HIGH (ref 1.7–7.7)
Neutrophils Relative %: 84 %
Platelets: 130 10*3/uL — ABNORMAL LOW (ref 150–400)
RBC: 4.3 MIL/uL (ref 3.87–5.11)
RDW: 14.6 % (ref 11.5–15.5)
WBC: 11.6 10*3/uL — ABNORMAL HIGH (ref 4.0–10.5)
nRBC: 0 % (ref 0.0–0.2)

## 2019-11-26 LAB — LIPASE, BLOOD: Lipase: 19 U/L (ref 11–51)

## 2019-11-26 MED ORDER — SODIUM CHLORIDE 0.9 % IV BOLUS
500.0000 mL | Freq: Once | INTRAVENOUS | Status: AC
  Administered 2019-11-26: 22:00:00 500 mL via INTRAVENOUS

## 2019-11-26 NOTE — ED Triage Notes (Signed)
Hour prior to calling 911, left side back pain radiated to left abdomen. Very painful and crying when EMS arrived. 6 attempts for iv failed en route. 22 LAC and got 100 fentanyl at 2000. Pain decreased. 4 mg ODT zofran. Wretching but no vomiting. Tried to urinate, cannot go. SBP 150 NSR 82, 100% RA CBG 142.

## 2019-11-26 NOTE — ED Provider Notes (Signed)
[facility] DEPT Provider Note   CSN: [id number] Arrival date & time: 11/26/19  2004     History Chief Complaint  Patient presents with  . Abdominal Pain  . Back Pain    Christine Norman is a 59 y.o. female.  HPI 59 year old female presents with sudden onset left back pain.  Started this afternoon in the car.  No vomiting but due to the pain she is had significant nausea.  Pain started wrapping around to her left lower abdomen and into her pelvis.  Feels a lot of pressure in her lower abdomen.  No position makes her back pain better.  Was given fentanyl by EMS and now her pain is significantly better and much more tolerable.  Went from a 10 down to a more moderate 5.  No fevers.  No previous symptoms similar to this. No radiation of pain down legs.   Past Medical History:  Diagnosis Date  . Allergy    seasonal  . Arthritis    ankles and back, s/p epidural injections  . 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   . Back pain   . Clotting disorder ([place])    history of 2 blood clots  . Constipation    chronic- takes OTC med that helps at times and others not   . Depression   . DVT (deep venous thrombosis), right 2000   rt knee-ankle  . GERD (gastroesophageal reflux disease)    IBS  . Hyperlipidemia   . Hypertension    not medicated  . Insomnia   . Thyroid disease    hypothyroidism    Patient Active Problem List   Diagnosis Date Noted  . Pelvic pain 11/03/2019  . Encounter for routine gynecological examination 11/02/2019  . LLQ pain 11/02/2019  . Vaginal atrophy 11/02/2019  . URI (upper respiratory infection) 08/26/2019  . Routine general medical examination at a health care facility 05/20/2019  . Advance care planning 05/20/2019  . Sacral pain 05/20/2019  . Diabetes mellitus without complication ([place]) 41/32/4401    . Hypersomnolence 01/21/2018  . Insomnia 07/20/2015  . Varicose veins of both lower extremities with complications 02/72/5366  . Chronic venous insufficiency 04/07/2015  . History of deep vein thrombosis 04/07/2015  . Bilateral carpal tunnel syndrome 03/11/2014  . Numbness in both legs 03/11/2014  . Hyperlipidemia   . Thyroid disease   . Arthritis   . History of hyperglycemia   . Hypertension   . GERD (gastroesophageal reflux disease)   . Depression     Past Surgical History:  Procedure Laterality Date  . BREAST BIOPSY  01/29/2012   Procedure: BREAST BIOPSY WITH NEEDLE LOCALIZATION;  Surgeon: Haywood Lasso, MD;  Location: [facility];  Service: General;  Laterality: Left;  . CHOLECYSTECTOMY  2001   lap choli  . COLONOSCOPY  2006   normal  . FRACTURE SURGERY  2001   orif rt fif with knee surg  . [place]   rt and lt  . HERNIA REPAIR  03   rt ing hernia  . KNEE ARTHROSCOPY  2000   rt  . TEAR DUCT PROBING Bilateral 2017   and plug placement to treat dry eye     OB History   No obstetric history on file.     Family History  Problem Relation Age of Onset  . Arthritis Mother   .  Diabetes Mother   . Heart failure Father   . Atrial fibrillation Father   . Colon polyps Father   . Heart disease Maternal Grandmother   . Heart disease Maternal Grandfather   . Colon cancer Neg Hx   . Rectal cancer Neg Hx   . Stomach cancer Neg Hx   . Breast cancer Neg Hx     Social History   Tobacco Use  . Smoking status: Former Smoker    Packs/day: 1.00    Years: 20.00    Pack years: 20.00    Quit date: 03/20/2010    Years since quitting: 9.6  . Smokeless tobacco: Never Used  Substance Use Topics  . Alcohol use: Yes    Alcohol/week: 0.0 standard drinks    Comment: one a month-socially  . Drug use: No    Home Medications Prior to Admission medications   Medication Sig Start Date End Date Taking? Authorizing Provider  esomeprazole  (NEXIUM) 20 MG capsule Take 1 capsule (20 mg total) by mouth daily as needed. Patient taking differently: Take 20 mg by mouth daily as needed (acid reflux).  05/19/19  Yes Tonia Ghent, MD  metFORMIN (GLUCOPHAGE-XR) 500 MG 24 hr tablet TAKE 1 TABLET (563m) BY MOUTH DAILY WITH BREAKFAST. Patient taking differently: Take 500 mg by mouth daily with breakfast.  08/24/19  Yes DTonia Ghent MD  oxybutynin (DITROPAN) 5 MG tablet TAKE 1 TABLET 3 TIMES A DAYAS NEEDED FOR BLADDER      SPASMS Patient taking differently: Take 5 mg by mouth 3 (three) times daily as needed for bladder spasms.  08/22/18  Yes DTonia Ghent MD  rosuvastatin (CRESTOR) 10 MG tablet TAKE 1 TABLET DAILY Patient taking differently: Take 10 mg by mouth daily.  03/03/19  Yes DTonia Ghent MD  SYNTHROID 100 MCG tablet TAKE 1 TABLET DAILY Patient taking differently: Take 100 mcg by mouth daily before breakfast.  07/23/19  Yes DTonia Ghent MD  temazepam (RESTORIL) 15 MG capsule Take 1 capsule (15 mg total) by mouth at bedtime as needed for sleep. 09/15/19  Yes DTonia Ghent MD  traMADol (ULTRAM) 50 MG tablet 1 po q 8 hours prn pain Patient taking differently: Take 50 mg by mouth every 8 (eight) hours as needed for moderate pain.  11/26/18  Yes Copland, SFrederico Hamman MD  ACCU-CHEK FASTCLIX LANCETS MISC Check blood sugar once daily and as directed.  Dx code: e11.9 04/24/18   DTonia Ghent MD  Blood Glucose Monitoring Suppl (ACCU-CHEK GUIDE) w/Device KIT 1 kit by Subdermal route daily. Use as instructed to test blood sugar once daily and as needed.  Diagnosis:  E11.9  Non insulin dependent.by Does not apply route. 04/24/18   DTonia Ghent MD  glucose blood (ACCU-CHEK GUIDE) test strip Use as instructed to test blood sugar once daily and as needed.  Diagnosis:  E11.9  Non insulin dependent. 04/25/18   DTonia Ghent MD  HYDROcodone-acetaminophen (NORCO) 5-325 MG tablet Take 1 tablet by mouth every 4 (four) hours as needed for  severe pain. 11/27/19   GSherwood Gambler MD    Allergies    Celebrex [celecoxib] and Etodolac  Review of Systems   Review of Systems  Constitutional: Negative for fever.  Gastrointestinal: Positive for abdominal pain and nausea. Negative for vomiting.  Genitourinary: Positive for flank pain. Negative for dysuria and hematuria.  Musculoskeletal: Positive for back pain.  All other systems reviewed and are negative.   Physical Exam Updated Vital  Signs BP 128/62   Pulse 67   Temp 98.4 F (36.9 C) (Oral)   Ht '5\' 4"'  (1.626 m)   Wt 88.9 kg   SpO2 98%   BMI 33.64 kg/m   Physical Exam Vitals and nursing note reviewed.  Constitutional:      General: She is not in acute distress.    Appearance: She is well-developed. She is obese. She is not ill-appearing or diaphoretic.  HENT:     Head: Normocephalic and atraumatic.     Right Ear: External ear normal.     Left Ear: External ear normal.     Nose: Nose normal.  Eyes:     General:        Right eye: No discharge.        Left eye: No discharge.  Cardiovascular:     Rate and Rhythm: Normal rate and regular rhythm.     Heart sounds: Normal heart sounds.  Pulmonary:     Effort: Pulmonary effort is normal.     Breath sounds: Normal breath sounds.  Abdominal:     Palpations: Abdomen is soft.     Tenderness: There is abdominal tenderness in the suprapubic area. There is left CVA tenderness.  Skin:    General: Skin is warm and dry.  Neurological:     Mental Status: She is alert.  Psychiatric:        Mood and Affect: Mood is not anxious.     ED Results / Procedures / Treatments   Labs (all labs ordered are listed, but only abnormal results are displayed) Labs Reviewed  COMPREHENSIVE METABOLIC PANEL - Abnormal; Notable for the following components:      Result Value   Potassium 2.3 (*)    Chloride 123 (*)    CO2 15 (*)    Glucose, Bld 103 (*)    Calcium 5.8 (*)    Total Protein 4.5 (*)    Albumin 2.6 (*)    AST 12 (*)     All other components within normal limits  URINALYSIS, ROUTINE W REFLEX MICROSCOPIC - Abnormal; Notable for the following components:   Hgb urine dipstick MODERATE (*)    RBC / HPF >50 (*)    Bacteria, UA RARE (*)    All other components within normal limits  CBC WITH DIFFERENTIAL/PLATELET - Abnormal; Notable for the following components:   WBC 11.6 (*)    Platelets 130 (*)    Neutro Abs 9.7 (*)    All other components within normal limits  COMPREHENSIVE METABOLIC PANEL - Abnormal; Notable for the following components:   Glucose, Bld 139 (*)    Calcium 8.5 (*)    All other components within normal limits  LIPASE, BLOOD  CBC WITH DIFFERENTIAL/PLATELET    EKG None  Radiology CT Renal Stone Study  Result Date: 11/26/2019 CLINICAL DATA:  59 year old female with left back and flank pain radiating to the abdomen for 2 hours. Unable to void. EXAM: CT ABDOMEN AND PELVIS WITHOUT CONTRAST TECHNIQUE: Multidetector CT imaging of the abdomen and pelvis was performed following the standard protocol without IV contrast. COMPARISON:  None. FINDINGS: Lower chest: Negative; minimal atelectasis in the costophrenic angles. Hepatobiliary: Surgically absent gallbladder. Negative noncontrast liver. Pancreas: Negative. Spleen: Negative. Adrenals/Urinary Tract: Normal adrenal glands. Right nephrolithiasis in the mid and lower pole, punctate to 3 mm. No right hydronephrosis or stranding. Decompressed visible right ureter to the level of the pelvis. Mild left hydronephrosis and hydroureter with perinephric stranding. Punctate left renal  lower pole calculus. Left periureteral stranding continues into the pelvis, and a 2-3 mm calculus is identified just inside the urinary bladder at the left UVJ (series 2, image 70). Unremarkable urinary bladder otherwise. Stomach/Bowel: Negative rectum. Mild sigmoid redundancy with diverticulosis, no active inflammation. Mildly redundant but otherwise negative upstream colon. Normal  retrocecal appendix on series 2, image 45. Negative terminal ileum. No dilated small bowel. Negative stomach. No free air, free fluid. Vascular/Lymphatic: Vascular patency is not evaluated in the absence of IV contrast. Mild Aortoiliac calcified atherosclerosis. No lymphadenopathy. Reproductive: Negative noncontrast appearance. Other: No pelvic free fluid. Musculoskeletal: Lower lumbar facet arthropathy. No acute osseous abnormality identified. IMPRESSION: 1. Acute obstructive uropathy on the left with a 2-3 mm calculus just inside the urinary bladder at the left UVJ. 2. Bilateral nephrolithiasis. 3. Mild aortic atherosclerosis.  Sigmoid diverticulosis. Electronically Signed   By: Genevie Ann M.D.   On: 11/26/2019 22:53    Procedures Procedures (including critical care time)  Medications Ordered in ED Medications  sodium chloride 0.9 % bolus 500 mL (0 mLs Intravenous Stopped 11/26/19 2300)    ED Course  I have reviewed the triage vital signs and the nursing notes.  Pertinent labs & imaging results that were available during my care of the patient were reviewed by me and considered in my medical decision making (see chart for details).    MDM Rules/Calculators/A&P                      Patient's pain is controlled, probably as the stone is now in her bladder rather than only from the fentanyl from EMS.  No pain meds needed here.  CT does show stone though now in the bladder.  Personally reviewed this image.  Labs were also reviewed and the initial labs were quite off, and appears to be because the blood was drawn from the IV via EMS.  When straight stock for blood, labs are now reassuring.  Appears stable and will discharge home with urology follow-up. Final Clinical Impression(s) / ED Diagnoses Final diagnoses:  Left ureteral stone    Rx / DC Orders ED Discharge Orders         Ordered    HYDROcodone-acetaminophen (NORCO) 5-325 MG tablet  Every 4 hours PRN     11/27/19 0016            Sherwood Gambler, MD 11/27/19 [id number]

## 2019-11-27 LAB — COMPREHENSIVE METABOLIC PANEL
ALT: 14 U/L (ref 0–44)
AST: 18 U/L (ref 15–41)
Albumin: 3.8 g/dL (ref 3.5–5.0)
Alkaline Phosphatase: 59 U/L (ref 38–126)
Anion gap: 8 (ref 5–15)
BUN: 16 mg/dL (ref 6–20)
CO2: 23 mmol/L (ref 22–32)
Calcium: 8.5 mg/dL — ABNORMAL LOW (ref 8.9–10.3)
Chloride: 111 mmol/L (ref 98–111)
Creatinine, Ser: 0.9 mg/dL (ref 0.44–1.00)
GFR calc Af Amer: >60 mL/min (ref >60)
GFR calc non Af Amer: >60 mL/min (ref >60)
Glucose, Bld: 139 mg/dL — ABNORMAL HIGH (ref 70–99)
Potassium: 3.9 mmol/L (ref 3.5–5.1)
Sodium: 142 mmol/L (ref 135–145)
Total Bilirubin: 0.9 mg/dL (ref 0.3–1.2)
Total Protein: 6.6 g/dL (ref 6.5–8.1)

## 2019-11-27 MED ORDER — HYDROCODONE-ACETAMINOPHEN 5-325 MG PO TABS: 1.0000 | ORAL_TABLET | ORAL | 0 refills | Status: DC | PRN

## 2019-11-27 NOTE — Discharge Instructions (Signed)
If you develop worsening, continued, or recurrent abdominal pain, uncontrolled vomiting, fever, chest or back pain, or any other new/concerning symptoms then return to the ER for evaluation.  

## 2019-12-03 ENCOUNTER — Encounter: Payer: Self-pay | Admitting: Family Medicine

## 2019-12-03 ENCOUNTER — Ambulatory Visit: Payer: BC Managed Care – PPO | Admitting: Family Medicine

## 2019-12-03 ENCOUNTER — Telehealth: Payer: Self-pay | Admitting: Family Medicine

## 2019-12-03 ENCOUNTER — Other Ambulatory Visit: Payer: Self-pay

## 2019-12-03 VITALS — BP 142/80 | HR 71 | Temp 96.6°F | Ht 64.0 in | Wt 197.6 lb

## 2019-12-03 DIAGNOSIS — R1032 Left lower quadrant pain: Secondary | ICD-10-CM

## 2019-12-03 DIAGNOSIS — R300 Dysuria: Secondary | ICD-10-CM | POA: Diagnosis not present

## 2019-12-03 DIAGNOSIS — N200 Calculus of kidney: Secondary | ICD-10-CM

## 2019-12-03 DIAGNOSIS — G4700 Insomnia, unspecified: Secondary | ICD-10-CM

## 2019-12-03 LAB — POC URINALSYSI DIPSTICK (AUTOMATED)
Bilirubin, UA: NEGATIVE
Blood, UA: NEGATIVE
Glucose, UA: NEGATIVE
Ketones, UA: NEGATIVE
Leukocytes, UA: NEGATIVE
Nitrite, UA: NEGATIVE
Protein, UA: NEGATIVE
Spec Grav, UA: 1.015 (ref 1.010–1.025)
Urobilinogen, UA: 0.2 E.U./dL
pH, UA: 6 (ref 5.0–8.0)

## 2019-12-03 MED ORDER — TEMAZEPAM 15 MG PO CAPS: 15.0000 mg | ORAL_CAPSULE | Freq: Every evening | ORAL | 1 refills | Status: DC | PRN

## 2019-12-03 NOTE — Progress Notes (Signed)
This visit occurred during the SARS-CoV-2 public health emergency.  Safety protocols were in place, including screening questions prior to the visit, additional usage of staff PPE, and extensive cleaning of exam room while observing appropriate contact time as indicated for disinfecting solutions.  She hasn't taken hydrocodone recently.  Recent ER eval for renal stone.  Pain resolved.  She was able to catch the stone and has urology f/u pending.    Still with LLQ abd discomfort/pressure/bloating.  This seems to be a separate issue from the above.  She feels like her bladder is full but doesn't completely empty.  Urinary frequency.  No pain on urination.  This is the same issue she had over the last month or so, LLQ sx.  She has sigmoid diverticulosis.  She has had constant LLQ sx for the last week.  No blood in stool. No fevers.    Discussed temazepam use.  She was asking about inc in dose.  Sleep worse since her father died.  D/w pt.  If she "wakes up in the middle of the night, it's done" meaning she has trouble getting back to sleep.    She had 2nd covid vaccine this week.  Some fatigue noted.    Meds, vitals, and allergies reviewed.   ROS: Per HPI unless specifically indicated in ROS section   GEN: nad, alert and oriented HEENT: ncat NECK: supple w/o LA CV: rrr. PULM: ctab, no inc wob ABD: soft, +bs, LLQ mildly tender to palpation without rebound. EXT: no edema SKIN: no acute rash Speech and judgment intact, normal.

## 2019-12-03 NOTE — Telephone Encounter (Signed)
error 

## 2019-12-03 NOTE — Patient Instructions (Signed)
I would stop metformin for about 1 week and see if you improve.  If not then let me know.  Take care.  Glad to see you. Okay to take an extra temazepam if needed.

## 2019-12-04 LAB — URINE CULTURE
MICRO NUMBER:: 10368011
Result:: NO GROWTH
SPECIMEN QUALITY:: ADEQUATE

## 2019-12-05 DIAGNOSIS — N200 Calculus of kidney: Secondary | ICD-10-CM | POA: Insufficient documentation

## 2019-12-05 NOTE — Assessment & Plan Note (Signed)
This would be atypical for diverticulitis given the duration.  Still okay for outpatient follow-up.  I question if this is partly related to Metformin use and GI upset.  Discussed options.  At this point hold off on intervention other than stopping metformin and she will let me know how that goes.  She agrees with plan.

## 2019-12-05 NOTE — Assessment & Plan Note (Signed)
She was able to catch the past stone and has urology follow-up pending.  I will await those notes.

## 2019-12-05 NOTE — Assessment & Plan Note (Signed)
Okay to increase temazepam with hope that she will be able to decrease her usage back to 1 pill at night in the future, after her situation is improved.

## 2020-01-12 ENCOUNTER — Encounter: Payer: Self-pay | Admitting: Family Medicine

## 2020-01-29 ENCOUNTER — Other Ambulatory Visit: Payer: Self-pay | Admitting: *Deleted

## 2020-01-29 ENCOUNTER — Encounter: Payer: Self-pay | Admitting: Family Medicine

## 2020-01-29 ENCOUNTER — Other Ambulatory Visit: Payer: Self-pay | Admitting: Family Medicine

## 2020-01-29 MED ORDER — ACCU-CHEK FASTCLIX LANCETS MISC: 3 refills | Status: DC

## 2020-01-29 MED ORDER — ACCU-CHEK GUIDE VI STRP: ORAL_STRIP | 3 refills | Status: DC

## 2020-02-26 MED ORDER — ACCU-CHEK GUIDE W/DEVICE KIT: 1.0000 | PACK | Freq: Every day | 0 refills | Status: DC

## 2020-02-26 MED ORDER — ACCU-CHEK FASTCLIX LANCETS MISC: 3 refills | Status: DC

## 2020-02-26 MED ORDER — ACCU-CHEK GUIDE VI STRP: ORAL_STRIP | 3 refills | Status: DC

## 2020-02-26 MED ORDER — ACCU-CHEK GUIDE W/DEVICE KIT: 1.0000 | PACK | Freq: Every day | 0 refills | Status: AC

## 2020-02-26 NOTE — Telephone Encounter (Signed)
Phoned in rx for the meter, test strips and lancets. [facility] stated that will ship to patient in the next 5-10 days.

## 2020-02-26 NOTE — Addendum Note (Signed)
Addended by: Aviva Signs M on: 02/26/2020 11:55 AM   Modules accepted: Orders

## 2020-05-03 ENCOUNTER — Other Ambulatory Visit: Payer: Self-pay

## 2020-05-03 ENCOUNTER — Telehealth: Payer: Self-pay

## 2020-05-03 ENCOUNTER — Telehealth (INDEPENDENT_AMBULATORY_CARE_PROVIDER_SITE_OTHER): Payer: BC Managed Care – PPO | Admitting: Family Medicine

## 2020-05-03 ENCOUNTER — Encounter: Payer: Self-pay | Admitting: Family Medicine

## 2020-05-03 VITALS — Ht 64.0 in

## 2020-05-03 DIAGNOSIS — Z91038 Other insect allergy status: Secondary | ICD-10-CM | POA: Diagnosis not present

## 2020-05-03 MED ORDER — PREDNISONE 20 MG PO TABS: ORAL_TABLET | ORAL | 0 refills | Status: DC

## 2020-05-03 NOTE — Progress Notes (Signed)
VIRTUAL VISIT Due to national recommendations of social distancing due to [place] 19, a virtual visit is felt to be most appropriate for this patient at this time.   I connected with the patient on 05/03/20 at 10:20 AM EDT by virtual telehealth platform and verified that I am speaking with the correct person using two identifiers.   I discussed the limitations, risks, security and privacy concerns of performing an evaluation and management service by  virtual telehealth platform and the availability of in person appointments. I also discussed with the patient that there may be a patient responsible charge related to this service. The patient expressed understanding and agreed to proceed.  Patient location: Home Provider Location: [facility] Participants: Eliezer Lofts and Angelena Form   Chief Complaint  Patient presents with  . Insect Bite    Sunday Evening-Corner of left eye  . Facial Swelling    History of Present Illness:  59 year old female patient of  Dr. Damita Dunnings presents following insect bite ( yellow jacket)below left eye occuring on motorcycle trip 2 days ago   Noted immediate swelling around eye.. worsening swelling  Around eye. No vision change. She has noted some discharge from  Skin is itchy.   She has tylenol , benadryl and  taken iced the area.   no fever.  Mild lip swelling, no throat swelling, no tunble breathing or swallowing.   COVID 19 screen No recent travel or known exposure to COVID19 The patient denies respiratory symptoms of COVID 19 at this time.  The importance of social distancing was discussed today.   Review of Systems  Constitutional: Negative for chills and fever.  HENT: Negative for congestion and ear pain.   Eyes: Negative for pain and redness.  Respiratory: Negative for cough and shortness of breath.   Cardiovascular: Negative for chest pain, palpitations and leg swelling.  Gastrointestinal: Negative for abdominal pain, blood in stool,  constipation, diarrhea, nausea and vomiting.  Genitourinary: Negative for dysuria.  Musculoskeletal: Negative for falls and myalgias.  Skin: Positive for itching and rash.  Neurological: Negative for dizziness.  Psychiatric/Behavioral: Negative for depression. The patient is not nervous/anxious.       Past Medical History:  Diagnosis Date  . Allergy    seasonal  . Arthritis    ankles and back, s/p epidural injections  . 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   . Back pain   . Clotting disorder ([place])    history of 2 blood clots  . Constipation    chronic- takes OTC med that helps at times and others not   . Depression   . DVT (deep venous thrombosis), right 2000   rt knee-ankle  . GERD (gastroesophageal reflux disease)    IBS  . Hyperlipidemia   . Hypertension    not medicated  . Insomnia   . Thyroid disease    hypothyroidism    reports that she quit smoking about 10 years ago. She has a 20.00 pack-year smoking history. She has never used smokeless tobacco. She reports current alcohol use. She reports that she does not use drugs.   Current Outpatient Medications:  .  Accu-Chek FastClix Lancets MISC, Use to test blood sugar daily. DX: E11.9, Disp: 100 each, Rfl: 3 .  Accu-Chek FastClix Lancets MISC, Use to test blood sugar daily. DX: E11.9, Disp: 102 each, Rfl: 3 .  Blood Glucose  Monitoring Suppl (ACCU-CHEK GUIDE) w/Device KIT, 1 kit by Subdermal route daily. Use to test blood sugar daily. DX: E11.9, Disp: 1 kit, Rfl: 0 .  esomeprazole (NEXIUM) 20 MG capsule, Take 1 capsule (20 mg total) by mouth daily as needed., Disp: , Rfl:  .  glucose blood (ACCU-CHEK GUIDE) test strip, Use as instructed to test blood sugar once daily and as needed.  Diagnosis:  E11.9  Non insulin dependent., Disp: 100 each, Rfl: 3 .  metFORMIN (GLUCOPHAGE-XR) 500 MG 24 hr  tablet, TAKE 1 TABLET (568m) BY MOUTH DAILY WITH BREAKFAST., Disp: , Rfl:  .  oxybutynin (DITROPAN) 5 MG tablet, TAKE 1 TABLET 3 TIMES A DAYAS NEEDED FOR BLADDER      SPASMS, Disp: 90 tablet, Rfl: 1 .  rosuvastatin (CRESTOR) 10 MG tablet, TAKE 1 TABLET DAILY, Disp: 90 tablet, Rfl: 1 .  SYNTHROID 100 MCG tablet, TAKE 1 TABLET DAILY, Disp: 90 tablet, Rfl: 1 .  temazepam (RESTORIL) 15 MG capsule, Take 1-2 capsules (15-30 mg total) by mouth at bedtime as needed for sleep., Disp: 180 capsule, Rfl: 1 .  traMADol (ULTRAM) 50 MG tablet, 1 po q 8 hours prn pain, Disp: 20 tablet, Rfl: 0   Observations/Objective: Height '5\' 4"'  (1.626 m).  Physical Exam  Physical Exam Constitutional:      General: The patient is not in acute distress.  swelling under right eye and around eye.. no redness of skin, no bite visualized, conjunctiva clear. Pulmonary:     Effort: Pulmonary effort is normal. No respiratory distress.  Neurological:     Mental Status: The patient is alert and oriented to person, place, and time.  Psychiatric:        Mood and Affect: Mood normal.        Behavior: Behavior normal.   Assessment and Plan   Allergic reaction to insect bite Treat with claritin during the day, benadryl at night.  Start perd taper.  If any SOB or trouble swallowing..Marland Kitchengo to ER.  if not improving.. follow up.   I discussed the assessment and treatment plan with the patient. The patient was provided an opportunity to ask questions and all were answered. The patient agreed with the plan and demonstrated an understanding of the instructions.   The patient was advised to call back or seek an in-person evaluation if the symptoms worsen or if the condition fails to improve as anticipated.     AEliezer Lofts MD

## 2020-05-03 NOTE — Telephone Encounter (Signed)
Christine Norman - Client TELEPHONE ADVICE RECORD [facility] Patient Name: Christine Norman' Gender: Female DOB: January 21, 1961 Age: 59 Y 78 M 19 D Return Phone Number: [phone] (Primary) Address: City/State/Zip: [place] [address] Client [facility] [facility] - Client Client Site [place] - [facility] Physician Renford Dills - MD Contact Type Call Who Is Calling Patient / Member / Family / Caregiver Call Type Triage / Clinical Relationship To Patient Self Return Phone Number [phone] (Primary) Chief Complaint Facial Swelling Reason for Call Symptomatic / Request for Health Information Initial Comment The pt was stung with a bee yesterday and now is swollen and puffy under her eye. Translation No Nurse Assessment Nurse: Claiborne Billings, RN, Kim Date/Time (Eastern Time): 05/03/2020 9:29:15 AM Confirm and document reason for call. If symptomatic, describe symptoms. ---Caller states she was stung by a bee underneath her left eye on Sunday, was swollen yesterday under her eye so she applied ice and took Benadryl. States she woke up today and the area underneath is red and more swollen, eyelid is a little swollen also. Has the patient had close contact with a person known or suspected to have the novel coronavirus illness OR traveled / lives in area with major community spread (including international travel) in the last 14 days from the onset of symptoms? * If Asymptomatic, screen for exposure and travel within the last 14 days. ---No Does the patient have any new or worsening symptoms? ---Yes Will a triage be completed? ---Yes Related visit to physician within the last 2 weeks? ---Yes Does the PT have any chronic conditions? (i.e. diabetes, asthma, this includes High risk factors for pregnancy, etc.) ---Yes Is this a behavioral health or substance abuse call? ---No Guidelines Guideline Title Affirmed Question Affirmed Notes Nurse  Date/Time Eilene Ghazi Time) Eye - Swelling MODERATE-SEVERE eyelid swelling on one side (Exception: due to a mosquito bite) Claiborne Billings, RN, Kim 05/03/2020 9:32:02 AM Disp. Time Eilene Ghazi Time) Disposition Final User 05/03/2020 9:33:42 AM See PCP within 24 Hours Yes Claiborne Billings, RN, KimPLEASE NOTE: All timestamps contained within this report are represented as [place] Standard Time. CONFIDENTIALTY NOTICE: This fax transmission is intended only for the addressee. It contains information that is legally privileged, confidential or otherwise protected from use or disclosure. If you are not the intended recipient, you are strictly prohibited from reviewing, disclosing, copying using or disseminating any of this information or taking any action in reliance on or regarding this information. If you have received this fax in error, please notify us immediately by telephone so that we can arrange for its return to [place]. Phone: [phone], Toll-Free: [phone], Fax: [phone] Page: 2 of 2 Call Id: [id number] [place] Disagree/Comply Comply Caller Understands Yes PreDisposition Did not know what to do Care Advice Given Per Guideline SEE PCP WITHIN 24 HOURS: * IF OFFICE WILL BE OPEN: You need to be examined within the next 24 hours. Call your doctor (or NP/PA) when the office opens and make an appointment. LOCAL COLD: * Apply a cool, wet washcloth to the area for 20 minutes. CALL BACK IF: * Fever occurs * You become worse CARE ADVICE given per Eye - Swelling (Adult) guideline. Referrals REFERRED TO PCP OFFICE

## 2020-05-03 NOTE — Telephone Encounter (Signed)
Pt had VV with Dr. Diona Browner today.

## 2020-05-03 NOTE — Telephone Encounter (Signed)
Noted. Thanks.

## 2020-05-03 NOTE — Assessment & Plan Note (Signed)
Treat with claritin during the day, benadryl at night.  Start perd taper.  If any SOB or trouble swallowing.Marland Kitchen go to ER.  if not improving.. follow up.

## 2020-05-06 ENCOUNTER — Other Ambulatory Visit: Payer: Self-pay | Admitting: Family Medicine

## 2020-05-06 DIAGNOSIS — G4700 Insomnia, unspecified: Secondary | ICD-10-CM

## 2020-05-06 NOTE — Telephone Encounter (Signed)
Electronic refill request. Temazepam Last office visit:   12/03/2019 Last Filled:    180 capsule 1 12/03/2019

## 2020-05-08 NOTE — Telephone Encounter (Signed)
Sent. Thanks.  Due for CPE when possible this fall.

## 2020-05-09 NOTE — Telephone Encounter (Signed)
Left detailed message on voicemail to call in for CPE this fall.

## 2020-05-24 LAB — HM MAMMOGRAPHY

## 2020-06-03 ENCOUNTER — Encounter: Payer: Self-pay | Admitting: Family Medicine

## 2020-07-10 ENCOUNTER — Other Ambulatory Visit: Payer: Self-pay | Admitting: Family Medicine

## 2020-07-10 DIAGNOSIS — E119 Type 2 diabetes mellitus without complications: Secondary | ICD-10-CM

## 2020-07-11 ENCOUNTER — Other Ambulatory Visit (INDEPENDENT_AMBULATORY_CARE_PROVIDER_SITE_OTHER): Payer: BC Managed Care – PPO

## 2020-07-11 ENCOUNTER — Other Ambulatory Visit: Payer: Self-pay | Admitting: Family Medicine

## 2020-07-11 ENCOUNTER — Other Ambulatory Visit: Payer: Self-pay

## 2020-07-11 DIAGNOSIS — E119 Type 2 diabetes mellitus without complications: Secondary | ICD-10-CM | POA: Diagnosis not present

## 2020-07-11 NOTE — Telephone Encounter (Signed)
Pharmacy requests refill on: Synthroid 100 mcg  LAST REFILL: 04/10/2020 LAST OV: 12/03/2019 NEXT OV: 07/18/2020 PHARMACY: [facility]   TSH (07/11/2020)

## 2020-07-11 NOTE — Addendum Note (Signed)
Addended by: Jacob Moores on: 07/11/2020 08:50 AM   Modules accepted: Orders

## 2020-07-12 LAB — COMPREHENSIVE METABOLIC PANEL
AG Ratio: 1.6 (calc) (ref 1.0–2.5)
ALT: 14 U/L (ref 6–29)
AST: 15 U/L (ref 10–35)
Albumin: 4.1 g/dL (ref 3.6–5.1)
Alkaline phosphatase (APISO): 68 U/L (ref 37–153)
BUN: 14 mg/dL (ref 7–25)
CO2: 25 mmol/L (ref 20–32)
Calcium: 8.9 mg/dL (ref 8.6–10.4)
Chloride: 105 mmol/L (ref 98–110)
Creat: 0.98 mg/dL (ref 0.50–1.05)
Globulin: 2.5 g/dL (calc) (ref 1.9–3.7)
Glucose, Bld: 201 mg/dL — ABNORMAL HIGH (ref 65–99)
Potassium: 4.1 mmol/L (ref 3.5–5.3)
Sodium: 138 mmol/L (ref 135–146)
Total Bilirubin: 0.5 mg/dL (ref 0.2–1.2)
Total Protein: 6.6 g/dL (ref 6.1–8.1)

## 2020-07-12 LAB — CBC WITH DIFFERENTIAL/PLATELET
Absolute Monocytes: 313 cells/uL (ref 200–950)
Basophils Absolute: 41 cells/uL (ref 0–200)
Basophils Relative: 0.7 %
Eosinophils Absolute: 168 cells/uL (ref 15–500)
Eosinophils Relative: 2.9 %
HCT: 39 % (ref 35.0–45.0)
Hemoglobin: 12.7 g/dL (ref 11.7–15.5)
Lymphs Abs: 1276 cells/uL (ref 850–3900)
MCH: 27.9 pg (ref 27.0–33.0)
MCHC: 32.6 g/dL (ref 32.0–36.0)
MCV: 85.7 fL (ref 80.0–100.0)
MPV: 11.8 fL (ref 7.5–12.5)
Monocytes Relative: 5.4 %
Neutro Abs: 4002 cells/uL (ref 1500–7800)
Neutrophils Relative %: 69 %
Platelets: 79 10*3/uL — ABNORMAL LOW (ref 140–400)
RBC: 4.55 10*6/uL (ref 3.80–5.10)
RDW: 13.7 % (ref 11.0–15.0)
Total Lymphocyte: 22 %
WBC: 5.8 10*3/uL (ref 3.8–10.8)

## 2020-07-12 LAB — TSH: TSH: 3.8 mIU/L (ref 0.40–4.50)

## 2020-07-12 LAB — MICROALBUMIN / CREATININE URINE RATIO
Creatinine, Urine: 106 mg/dL (ref 20–275)
Microalb Creat Ratio: 7 mcg/mg creat (ref <30)
Microalb, Ur: 0.7 mg/dL

## 2020-07-12 LAB — LIPID PANEL
Cholesterol: 167 mg/dL (ref <200)
HDL: 50 mg/dL (ref >=50)
LDL Cholesterol (Calc): 87 mg/dL (calc)
Non-HDL Cholesterol (Calc): 117 mg/dL (calc) (ref <130)
Total CHOL/HDL Ratio: 3.3 (calc) (ref <5.0)
Triglycerides: 209 mg/dL — ABNORMAL HIGH (ref <150)

## 2020-07-12 LAB — HEMOGLOBIN A1C
Hgb A1c MFr Bld: 7.3 % of total Hgb — ABNORMAL HIGH (ref <5.7)
Mean Plasma Glucose: 163 (calc)
eAG (mmol/L): 9 (calc)

## 2020-07-18 ENCOUNTER — Ambulatory Visit (INDEPENDENT_AMBULATORY_CARE_PROVIDER_SITE_OTHER): Payer: BC Managed Care – PPO | Admitting: Family Medicine

## 2020-07-18 ENCOUNTER — Other Ambulatory Visit: Payer: Self-pay

## 2020-07-18 ENCOUNTER — Encounter: Payer: Self-pay | Admitting: Family Medicine

## 2020-07-18 VITALS — BP 140/82 | HR 77 | Temp 98.4°F | Ht 63.0 in | Wt 205.4 lb

## 2020-07-18 DIAGNOSIS — Z0000 Encounter for general adult medical examination without abnormal findings: Secondary | ICD-10-CM | POA: Diagnosis not present

## 2020-07-18 DIAGNOSIS — M25569 Pain in unspecified knee: Secondary | ICD-10-CM

## 2020-07-18 DIAGNOSIS — G4700 Insomnia, unspecified: Secondary | ICD-10-CM

## 2020-07-18 DIAGNOSIS — Z7189 Other specified counseling: Secondary | ICD-10-CM

## 2020-07-18 DIAGNOSIS — E119 Type 2 diabetes mellitus without complications: Secondary | ICD-10-CM

## 2020-07-18 DIAGNOSIS — E079 Disorder of thyroid, unspecified: Secondary | ICD-10-CM

## 2020-07-18 DIAGNOSIS — R7989 Other specified abnormal findings of blood chemistry: Secondary | ICD-10-CM

## 2020-07-18 NOTE — Progress Notes (Signed)
This visit occurred during the SARS-CoV-2 public health emergency.  Safety protocols were in place, including screening questions prior to the visit, additional usage of staff PPE, and extensive cleaning of exam room while observing appropriate contact time as indicated for disinfecting solutions.  CPE- See plan.  Routine anticipatory guidance given to patient.  See health maintenance.  The possibility exists that previously documented standard health maintenance information may have been brought forward from a previous encounter into this note.  If needed, that same information has been updated to reflect the current situation based on today's encounter.    Tetanus 2020 Flu 2021 Shingles d/w pt.   covid vaccine 2021 PNA d/w pt.   DXA not due  Pap 2021 Mammogram 2021 Colonoscopy 2016.  [name] designated if patient were incapacitated.   Diet and exercise d/w pt.    Still on replacement for hypothyroidism.  Compliant, no ADE on med.  No dysphagia.  No neck mass.  TSH wnl.    Diabetes:  Using medications without difficulties: still with GI sx likely related to metformin use, noted intermittently.  She has h/o constipation.  No fevers, no chills.  No blood in stools.  Sx better off metformin.   Hypoglycemic episodes:no Hyperglycemic episodes:no Feet problems: no Blood Sugars averaging: not checked often eye exam within last year: done 2 weeks ago.  Dr. Heather [place].   Low platelets.  H/o similar.  No bleeding.  Platelet clumps noted on smear-count appears adequate. ===================== abd u/s.    Uterus: The uterusis normal in size measuring 5.3 x 1.6 x 3.2 cm. The endometrial stripe is normal in thickness measuring 0.1 cm. There is no free pelvic fluid. Unremarkable cervix.   Right ovary: Normal appearance. Measures 1.7 x 1.1 x 1.2 cm. Flow present by Doppler analysis. Normal resistive index. No evidence of solid mass.   Left ovary: Normal appearance. Measures 2.3 x 1.4 x 1.4  cm. Flow present by Doppler analysis. Normal resistive index. No evidence of solid mass. =====================  She just had MRI knee and has ACL tear.  She is understandably upset about all of that and isn't happy about needing surgery.  D/w pt.  She is considering options.  Tramadol used prn.  She has used meloxicam without much relief.    Insomnia.  Using temazepam at night with relief.  No ADE on med.    PMH and SH reviewed  Meds, vitals, and allergies reviewed.   ROS: Per HPI.  Unless specifically indicated otherwise in HPI, the patient denies:  General: fever. Eyes: acute vision changes ENT: sore throat Cardiovascular: chest pain Respiratory: SOB GI: vomiting GU: dysuria Musculoskeletal: acute back pain Derm: acute rash Neuro: acute motor dysfunction Psych: worsening mood Endocrine: polydipsia Heme: bleeding Allergy: hayfever  GEN: nad, alert and oriented HEENT: ncat NECK: supple w/o LA CV: rrr. PULM: ctab, no inc wob ABD: soft, +bs EXT: no edema SKIN: no acute rash  Diabetic foot exam: Normal inspection No skin breakdown No calluses  Normal DP pulses Normal sensation to light touch and monofilament Nails normal

## 2020-07-18 NOTE — Patient Instructions (Addendum)
Recheck labs in about 3 months before a visit.  A1c and CBC.  Stop metformin in the meantime and see if your feel better.  Either way, let me know in about 2 weeks.   Try to get back on your diet in the meantime.   Take care.  Glad to see you.

## 2020-07-20 DIAGNOSIS — M25569 Pain in unspecified knee: Secondary | ICD-10-CM | POA: Insufficient documentation

## 2020-07-20 DIAGNOSIS — R7989 Other specified abnormal findings of blood chemistry: Secondary | ICD-10-CM | POA: Insufficient documentation

## 2020-07-20 NOTE — Assessment & Plan Note (Signed)
Using temazepam at night with relief.  No ADE on med.   Continue as needed temazepam use.

## 2020-07-20 NOTE — Assessment & Plan Note (Signed)
She just had MRI knee and has ACL tear.  She is understandably upset about all of that and isn't happy about needing surgery.  D/w pt.  She is considering options.  Tramadol used prn.  She has used meloxicam without much relief.  I will defer to orthopedics.

## 2020-07-20 NOTE — Assessment & Plan Note (Signed)
Tetanus 2020 Flu 2021 Shingles d/w pt.   covid vaccine 2021 PNA d/w pt.   DXA not due  Pap 2021 Mammogram 2021 Colonoscopy 2016.  [name] designated if patient were incapacitated.   Diet and exercise d/w pt.

## 2020-07-20 NOTE — Assessment & Plan Note (Signed)
  Still on replacement for hypothyroidism.  Compliant, no ADE on med.  No dysphagia.  No neck mass.  TSH wnl.  Continue levothyroxine as is.  She agrees.

## 2020-07-20 NOTE — Assessment & Plan Note (Signed)
H/o similar.  No bleeding.  Platelet clumps noted on smear-count appears adequate.  We can recheck this on follow-up.  I suspect she does not have a significant finding.

## 2020-07-20 NOTE — Assessment & Plan Note (Signed)
still with GI sx likely related to metformin use, noted intermittently.  She has h/o constipation.  No fevers, no chills.  No blood in stools.  Sx better off metformin.   Exercise limited by knee pain.  Discussed diet She will stop metformin in the meantime and see if she feels better.  Either way, she can let me know in about 2 weeks.  Plan on recheck in a few months.  She agrees.

## 2020-08-10 ENCOUNTER — Other Ambulatory Visit: Payer: Self-pay

## 2020-08-10 ENCOUNTER — Ambulatory Visit: Payer: BC Managed Care – PPO | Admitting: Internal Medicine

## 2020-08-10 ENCOUNTER — Encounter: Payer: Self-pay | Admitting: Internal Medicine

## 2020-08-10 VITALS — BP 140/82 | HR 72 | Temp 97.7°F | Wt 209.5 lb

## 2020-08-10 DIAGNOSIS — M546 Pain in thoracic spine: Secondary | ICD-10-CM | POA: Diagnosis not present

## 2020-08-10 MED ORDER — CLOBETASOL PROPIONATE 0.05 % EX SHAM: 1.0000 "application " | MEDICATED_SHAMPOO | Freq: Every day | CUTANEOUS | 2 refills | Status: DC

## 2020-08-10 MED ORDER — NAPROXEN 500 MG PO TABS: 500.0000 mg | ORAL_TABLET | Freq: Two times a day (BID) | ORAL | 0 refills | Status: DC

## 2020-08-10 MED ORDER — CYCLOBENZAPRINE HCL 5 MG PO TABS: 5.0000 mg | ORAL_TABLET | Freq: Every evening | ORAL | 0 refills | Status: DC | PRN

## 2020-08-10 NOTE — Progress Notes (Signed)
Subjective:    Patient ID: Christine Norman, female    DOB: Nov 28, 1960, 59 y.o.   MRN: [id number]  HPI  Patient presents to the clinic today with complaint of back pain. This started 3 days ago. She describes the feeling as "electricity"- sharp, stabbing and burning in her mid to upper back. The pain does not radiate. It is worse with movement or if something touches it. She denies any injury to the area. She has not noticed any rash in the area. She has tried Ibuprofen OTC with minimal relief of symptoms.  She reports this does not feel muscular, she has chronic back pain.   Review of Systems      Past Medical History:  Diagnosis Date  . Allergy    seasonal  . Arthritis    ankles and back, s/p epidural injections  . 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   . Back pain   . Clotting disorder ([place])    history of 2 blood clots around surgery  . Constipation    chronic- takes OTC med that helps at times and others not   . Depression   . Diabetes mellitus without complication ([place])   . DVT (deep venous thrombosis), right 2000   rt knee-ankle  . GERD (gastroesophageal reflux disease)    IBS  . Hyperlipidemia   . Hypertension    not medicated  . Insomnia   . Thyroid disease    hypothyroidism    Current Outpatient Medications  Medication Sig Dispense Refill  . Accu-Chek FastClix Lancets MISC Use to test blood sugar daily. DX: E11.9 102 each 3  . Blood Glucose Monitoring Suppl (ACCU-CHEK GUIDE) w/Device KIT 1 kit by Subdermal route daily. Use to test blood sugar daily. DX: E11.9 1 kit 0  . esomeprazole (NEXIUM) 20 MG capsule Take 1 capsule (20 mg total) by mouth daily as needed.    Marland Kitchen glucose blood (ACCU-CHEK GUIDE) test strip Use as instructed to test blood sugar once daily and as needed.  Diagnosis:  E11.9  Non insulin dependent. 100 each 3  .  rosuvastatin (CRESTOR) 10 MG tablet TAKE 1 TABLET DAILY 90 tablet 1  . SYNTHROID 100 MCG tablet TAKE 1 TABLET DAILY 90 tablet 1  . temazepam (RESTORIL) 15 MG capsule TAKE 1-2 CAPSULES (15-30 MG TOTAL) BY MOUTH AT BEDTIME AS NEEDED FOR SLEEP. 180 capsule 1  . traMADol (ULTRAM) 50 MG tablet 1 po q 8 hours prn pain 20 tablet 0   No current facility-administered medications for this visit.    Allergies  Allergen Reactions  . Celebrex [Celecoxib] Hives  . Etodolac     SOB/pain    Family History  Problem Relation Age of Onset  . Arthritis Mother   . Diabetes Mother   . Heart failure Father   . Atrial fibrillation Father   . Colon polyps Father   . Heart disease Maternal Grandmother   . Heart disease Maternal Grandfather   . Colon cancer Neg Hx   . Rectal cancer Neg Hx   . Stomach cancer Neg Hx   . Breast cancer Neg Hx     Social History   Socioeconomic History  . Marital status: Married    Spouse name: Not on file  . Number of children: Not on file  . Years of education: Not on file  . Highest education level: Not on file  Occupational History  . Not on file  Tobacco Use  . Smoking status: Former Smoker    Packs/day: 1.00    Years: 20.00    Pack years: 20.00    Quit date: 03/20/2010    Years since quitting: 10.4  . Smokeless tobacco: Never Used  Substance and Sexual Activity  . Alcohol use: Yes    Alcohol/week: 0.0 standard drinks    Comment: one a month-socially  . Drug use: No  . Sexual activity: Never  Other Topics Concern  . Not on file  Social History Narrative   No kids   [employer] fan   Retired from [employer] 2019   Married 2017   Social Determinants of Health   Financial Resource Strain: Not on [employer] Insecurity: Not on file  Transportation Needs: Not on file  Physical Activity: Not on file  Stress: Not on file  Social Connections: Not on file  Intimate Partner Violence: Not on file     Constitutional: Denies fever, malaise, fatigue, headache or  abrupt weight changes.  Respiratory: Denies difficulty breathing, shortness of breath, cough or sputum production.   Cardiovascular: Denies chest pain, chest tightness, palpitations or swelling in the hands or feet.  MSK: Pt reports mid back pain. Denies decrease in range of motion, joint swelling or difficulty with gait. Skin: Denies rash, lesion, ulceration or redness.   No other specific complaints in a complete review of systems (except as listed in HPI above).  Objective:   Physical Exam    BP 140/82   Pulse 72   Temp 97.7 F (36.5 C) (Temporal)   Wt 209 lb 8 oz (95 kg)   SpO2 100%   BMI 37.11 kg/m   Wt Readings from Last 3 Encounters:  07/18/20 205 lb 6.4 oz (93.2 kg)  12/03/19 197 lb 9 oz (89.6 kg)  11/26/19 196 lb (88.9 kg)    General: Appears her stated age, obese in NAD. Skin: Warm, dry and intact. No rashes, lesions or ulcerations noted. Cardiovascular: Normal rate and rhythm.  Pulmonary/Chest: Normal effort and positive vesicular breath sounds. No respiratory distress. No wheezes, rales or ronchi noted.  Musculoskeletal: Normal flexion, extension and rotation of the spine. Bony tenderness noted over the thoracic spine. Pain with palpation in the right subscapular area. Shoulder shrug equal. Strength 5/5 BUE. Hand grips equal.  Neurological: Alert and oriented.    BMET    Component Value Date/Time   NA 138 07/11/2020 0850   K 4.1 07/11/2020 0850   CL 105 07/11/2020 0850   CO2 25 07/11/2020 0850   GLUCOSE 201 (H) 07/11/2020 0850   BUN 14 07/11/2020 0850   CREATININE 0.98 07/11/2020 0850   CALCIUM 8.9 07/11/2020 0850   GFRNONAA >60 11/26/2019 2251   GFRNONAA 65 06/14/2014 0922   GFRAA >60 11/26/2019 2251   GFRAA 75 06/14/2014 0922    Lipid Panel     Component Value Date/Time   CHOL 167 07/11/2020 0850   TRIG 209 (H) 07/11/2020 0850   HDL 50 07/11/2020 0850   CHOLHDL 3.3 07/11/2020 0850   VLDL 48 (H) 06/14/2014 0922   LDLCALC 87 07/11/2020 0850     CBC    Component Value Date/Time   WBC 5.8 07/11/2020 0850   RBC 4.55 07/11/2020 0850   HGB 12.7 07/11/2020 0850   HCT 39.0 07/11/2020 0850   PLT 79 (L) 07/11/2020 0850   MCV 85.7 07/11/2020 0850   MCH 27.9 07/11/2020 0850   MCHC 32.6 07/11/2020  0850   RDW 13.7 07/11/2020 0850   LYMPHSABS 1,276 07/11/2020 0850   MONOABS 0.6 11/26/2019 2251   EOSABS 168 07/11/2020 0850   BASOSABS 41 07/11/2020 0850    Hgb A1C Lab Results  Component Value Date   HGBA1C 7.3 (H) 07/11/2020          Assessment & Plan:   Acute Right Sided Back Pain:  DDx include pinched nerve, shingles without the rash She had a steroid injection today, hx of DM 2 RX for Naproxen 500 mg BID with food- reviewed allergies, she reports she can take this RX for Flexeril 5 mg QHS- sedation caution given If worsens, consider thoracic xray, herpes zoster IgM  Return precautions discussed  Webb Silversmith, NP This visit occurred during the SARS-CoV-2 public health emergency.  Safety protocols were in place, including screening questions prior to the visit, additional usage of staff PPE, and extensive cleaning of exam room while observing appropriate contact time as indicated for disinfecting solutions.

## 2020-08-11 ENCOUNTER — Encounter: Payer: Self-pay | Admitting: Internal Medicine

## 2020-08-11 NOTE — Patient Instructions (Signed)

## 2020-08-29 ENCOUNTER — Ambulatory Visit: Payer: BC Managed Care – PPO | Admitting: Family Medicine

## 2020-08-29 ENCOUNTER — Other Ambulatory Visit: Payer: Self-pay

## 2020-08-29 ENCOUNTER — Encounter: Payer: Self-pay | Admitting: Family Medicine

## 2020-08-29 ENCOUNTER — Telehealth: Payer: Self-pay | Admitting: *Deleted

## 2020-08-29 VITALS — BP 140/88 | HR 75 | Temp 97.4°F | Ht 63.0 in | Wt 206.8 lb

## 2020-08-29 DIAGNOSIS — L03213 Periorbital cellulitis: Secondary | ICD-10-CM

## 2020-08-29 DIAGNOSIS — H10021 Other mucopurulent conjunctivitis, right eye: Secondary | ICD-10-CM | POA: Diagnosis not present

## 2020-08-29 MED ORDER — AMOXICILLIN-POT CLAVULANATE 875-125 MG PO TABS: 1.0000 | ORAL_TABLET | Freq: Two times a day (BID) | ORAL | 0 refills | Status: AC

## 2020-08-29 MED ORDER — POLYMYXIN B-TRIMETHOPRIM 10000-0.1 UNIT/ML-% OP SOLN: 1.0000 [drp] | OPHTHALMIC | 0 refills | Status: AC

## 2020-08-29 NOTE — Telephone Encounter (Signed)
Teana notified as instructed by telephone.  Patient states understanding. 

## 2020-08-29 NOTE — Telephone Encounter (Signed)
Patient left a voicemail stating that she saw Dr. Lorelei Pont this morning and forgot to ask him a question. Patient wants to know if it will be okay for her to go to a memorial service tomorrow night?

## 2020-08-29 NOTE — Telephone Encounter (Signed)
Yes.  I would not hug anyone, but pink eye is only catching by touch.

## 2020-08-29 NOTE — Progress Notes (Signed)
Haven Pylant T. Melisse Caetano, MD, [place] at [facility] [place], [address]  Phone: [phone]  FAX: [phone]  Christine Norman - 60 y.o. female  MRN [id number]  Date of Birth: [birth year]  Date: 08/29/2020  PCP: Tonia Ghent, MD  Referral: Tonia Ghent, MD  Chief Complaint  Patient presents with  . Conjunctivitis    Husband had some Poly B TMP eye drop and patient used yesterday    This visit occurred during the SARS-CoV-2 public health emergency.  Safety protocols were in place, including screening questions prior to the visit, additional usage of staff PPE, and extensive cleaning of exam room while observing appropriate contact time as indicated for disinfecting solutions.   Subjective:   Christine Norman is a 60 y.o. very pleasant female patient with Body mass index is 36.62 kg/m. who presents with the following:  Has + pink eye on the R  She has had a\about a 2-day history of some crusting, dripping, and pinkish coloration of the conjunctive on the right.  This morning she has developed some pinkish coloration and dripping from the left eye.  In addition, she does have some pinkish, warm changes around the right eye.  This is been present for about a day.  Review of Systems is noted in the HPI, as appropriate  Objective:   BP 140/88   Pulse 75   Temp (!) 97.4 F (36.3 C) (Temporal)   Ht '5\' 3"'  (1.6 m)   Wt 206 lb 12 oz (93.8 kg)   SpO2 98%   BMI 36.62 kg/m   GEN: No acute distress; alert,appropriate. PULM: Breathing comfortably in no respiratory distress PSYCH: Normally interactive.     There is some tenderness and warmth on the right side around the eye.  Laboratory and Imaging Data:  Assessment and Plan:     ICD-10-CM   1. Periorbital cellulitis of right eye  L03.213   2. Pink eye, right  H10.021    I am more concerned about what appears to be periorbital  cellulitis clinically.  We will treat with Augmentin x10 days.  If she worsens then reevaluation in the office versus ophthalmology evaluation.  Polytrim for pinkeye.  Meds ordered this encounter  Medications  . trimethoprim-polymyxin b (POLYTRIM) ophthalmic solution    Sig: Place 1 drop into both eyes every 4 (four) hours for 7 days.    Dispense:  10 mL    Refill:  0  . amoxicillin-clavulanate (AUGMENTIN) 875-125 MG tablet    Sig: Take 1 tablet by mouth 2 (two) times daily for 10 days.    Dispense:  20 tablet    Refill:  0   There are no discontinued medications. No orders of the defined types were placed in this encounter.   Follow-up: No follow-ups on file.  Signed,  Maud Deed. Ajay Strubel, MD   Outpatient Encounter Medications as of 08/29/2020  Medication Sig  . Accu-Chek FastClix Lancets MISC Use to test blood sugar daily. DX: E11.9  . amoxicillin-clavulanate (AUGMENTIN) 875-125 MG tablet Take 1 tablet by mouth 2 (two) times daily for 10 days.  . Blood Glucose Monitoring Suppl (ACCU-CHEK GUIDE) w/Device KIT 1 kit by Subdermal route daily. Use to test blood sugar daily. DX: E11.9  . Clobetasol Propionate 0.05 % shampoo Apply 1 application topically daily.  . cyclobenzaprine (FLEXERIL) 5 MG tablet Take 1 tablet (5 mg total) by mouth at bedtime as  needed for muscle spasms.  Marland Kitchen esomeprazole (NEXIUM) 20 MG capsule Take 1 capsule (20 mg total) by mouth daily as needed.  Marland Kitchen glucose blood (ACCU-CHEK GUIDE) test strip Use as instructed to test blood sugar once daily and as needed.  Diagnosis:  E11.9  Non insulin dependent.  . naproxen (NAPROSYN) 500 MG tablet Take 1 tablet (500 mg total) by mouth 2 (two) times daily with a meal.  . PENNSAID 2 % SOLN SMARTSIG:1 Pump Topical 3 Times Daily PRN  . RESTASIS 0.05 % ophthalmic emulsion   . rosuvastatin (CRESTOR) 10 MG tablet TAKE 1 TABLET DAILY  . SYNTHROID 100 MCG tablet TAKE 1 TABLET DAILY  . temazepam (RESTORIL) 15 MG capsule TAKE 1-2  CAPSULES (15-30 MG TOTAL) BY MOUTH AT BEDTIME AS NEEDED FOR SLEEP.  Marland Kitchen traMADol (ULTRAM) 50 MG tablet 1 po q 8 hours prn pain  . trimethoprim-polymyxin b (POLYTRIM) ophthalmic solution Place 1 drop into both eyes every 4 (four) hours for 7 days.   No facility-administered encounter medications on file as of 08/29/2020.

## 2020-09-08 ENCOUNTER — Other Ambulatory Visit: Payer: Self-pay | Admitting: Family Medicine

## 2020-09-17 ENCOUNTER — Encounter: Payer: Self-pay | Admitting: Family Medicine

## 2020-09-21 ENCOUNTER — Other Ambulatory Visit: Payer: Self-pay | Admitting: Family Medicine

## 2020-09-21 DIAGNOSIS — G4700 Insomnia, unspecified: Secondary | ICD-10-CM

## 2020-09-21 MED ORDER — TEMAZEPAM 15 MG PO CAPS: 15.0000 mg | ORAL_CAPSULE | Freq: Every evening | ORAL | 1 refills | Status: DC | PRN

## 2020-11-14 ENCOUNTER — Other Ambulatory Visit: Payer: Self-pay

## 2020-11-14 ENCOUNTER — Telehealth (INDEPENDENT_AMBULATORY_CARE_PROVIDER_SITE_OTHER): Payer: BC Managed Care – PPO | Admitting: Family

## 2020-11-14 DIAGNOSIS — R059 Cough, unspecified: Secondary | ICD-10-CM | POA: Diagnosis not present

## 2020-11-14 MED ORDER — BENZONATATE 100 MG PO CAPS: 100.0000 mg | ORAL_CAPSULE | Freq: Two times a day (BID) | ORAL | 0 refills | Status: DC | PRN

## 2020-11-14 NOTE — Progress Notes (Signed)
Virtual Visit via Video Note  I connected with Christine Norman on 11/14/20 at 11:00 AM EDT by a video enabled telemedicine application and verified that I am speaking with the correct person using two identifiers.  Location: Patient: home Provider: work   I discussed the limitations of evaluation and management by telemedicine and the availability of in person appointments. The patient expressed understanding and agreed to proceed. The patient and myself were present for today's video call. Her husband was present in the background.  History of Present Illness:  Patient is a 60 yr old female who presents today to discuss cough. States that she woke up Friday 11/11/20 with hoarsness. Initially she attributed this hoarseness to yelling at the [facility] game the night before. Then on Saturday 11/12/20, she developed coughing up phlegm. Took a home covid-19 test and it was negative. She denies associated fever but notes associated rhinorrhea. Today she woke up coughing with thick phlegm.  Reports hx of sinusitis in the past. Sore throat with cough only.  She does have a hx of seasonal allergies.   Observations/Objective:   Gen: Awake, alert, no acute distress Resp: Breathing is even and non-labored Psych: calm/pleasant demeanor Neuro: Alert and Oriented x 3, + facial symmetry, speech is clear.   Assessment and Plan:  Cough- likely viral URI versus allergies. Pt is advised as follows:  Add mucinex 600mg  bid. Add claritin 10mg  once daily. Nasal saline spray as needed. Tessalon 100mg  q8 hrs prn cough.  If fever, worsening symptoms or if not improved in 3 days, she is advised to reach out to [place] and would consider addition of antibiotic at that time.     Follow Up Instructions:    I discussed the assessment and treatment plan with the patient. The patient was provided an opportunity to ask questions and all were answered. The patient agreed with the plan and demonstrated an understanding of the  instructions.   The patient was advised to call back or seek an in-person evaluation if the symptoms worsen or if the condition fails to improve as anticipated.  Nance Pear, NP

## 2020-11-17 ENCOUNTER — Encounter: Payer: Self-pay | Admitting: Family

## 2020-11-17 DIAGNOSIS — R059 Cough, unspecified: Secondary | ICD-10-CM

## 2020-11-17 MED ORDER — HYDROCODONE-HOMATROPINE 5-1.5 MG/5ML PO SYRP: 5.0000 mL | ORAL_SOLUTION | Freq: Three times a day (TID) | ORAL | 0 refills | Status: AC | PRN

## 2020-11-17 MED ORDER — AZITHROMYCIN 250 MG PO TABS: ORAL_TABLET | ORAL | 0 refills | Status: DC

## 2020-11-17 MED ORDER — DOXYCYCLINE HYCLATE 100 MG PO CAPS: 100.0000 mg | ORAL_CAPSULE | Freq: Two times a day (BID) | ORAL | 0 refills | Status: DC

## 2020-11-17 NOTE — Telephone Encounter (Signed)
Called patient to discuss She did a video visit with Melissa on 3/28-  She notes productive cough and sinus congestion She is using delsym for cough  She is not sleeping well due to cough  No fever  She has been ill for about a week now I called in doxycycline for 10 days.  She also is concerned about persistent cough which is not letting her sleep.  She would like a stronger cough syrup.  I called in Hycodan syrup for her to use as needed, caution that this can cause sedation and should not be used when driving.  She is asked to let me know if not feeling better in the next few days

## 2020-11-17 NOTE — Telephone Encounter (Signed)
Spoke with Dr. Lorelei Pont- she sent doxy at the same time I sent zpak. [facility] as pt is expecting the doxycycline.

## 2020-11-17 NOTE — Telephone Encounter (Signed)
Mr Athens calling because has not heard from pt message this morning and pt is feeling worse. I spoke with Tiffany at [facility] and she said Debbrah Alar is not in office and Tingley has already gone. Tiffany put me on hold and Shakita came on phone and will speak with Mr Durr and send note to Debbrah Alar; warm transfer completed and note sent to Debbrah Alar NP and Dr Damita Dunnings.

## 2020-11-17 NOTE — Telephone Encounter (Signed)
Dr. Lorelei Pont can you please help since [place] not here.    Spoke with husband Christine Norman and he states that Christine Norman said something about maybe an antibiotic if patient is not feeling better.  She has to sleep in the recliner and cough keeps her up at night.  Tessalon Perles are no help.

## 2020-11-20 NOTE — Telephone Encounter (Signed)
See my chart messages.  I appreciate the help of all involved.

## 2020-11-28 ENCOUNTER — Other Ambulatory Visit: Payer: Self-pay | Admitting: Family Medicine

## 2020-12-27 ENCOUNTER — Encounter: Payer: Self-pay | Admitting: Family Medicine

## 2020-12-30 ENCOUNTER — Other Ambulatory Visit: Payer: Self-pay | Admitting: Family Medicine

## 2020-12-30 DIAGNOSIS — E119 Type 2 diabetes mellitus without complications: Secondary | ICD-10-CM

## 2020-12-30 MED ORDER — METFORMIN HCL ER 500 MG PO TB24: ORAL_TABLET | ORAL | 1 refills | Status: DC

## 2021-01-09 ENCOUNTER — Other Ambulatory Visit: Payer: Self-pay

## 2021-01-09 ENCOUNTER — Ambulatory Visit: Payer: BC Managed Care – PPO | Admitting: Family Medicine

## 2021-01-09 ENCOUNTER — Encounter: Payer: Self-pay | Admitting: Family Medicine

## 2021-01-09 DIAGNOSIS — N200 Calculus of kidney: Secondary | ICD-10-CM

## 2021-01-09 DIAGNOSIS — R7989 Other specified abnormal findings of blood chemistry: Secondary | ICD-10-CM | POA: Diagnosis not present

## 2021-01-09 DIAGNOSIS — M549 Dorsalgia, unspecified: Secondary | ICD-10-CM

## 2021-01-09 DIAGNOSIS — E119 Type 2 diabetes mellitus without complications: Secondary | ICD-10-CM | POA: Diagnosis not present

## 2021-01-09 DIAGNOSIS — Z114 Encounter for screening for human immunodeficiency virus [HIV]: Secondary | ICD-10-CM

## 2021-01-09 DIAGNOSIS — Z1159 Encounter for screening for other viral diseases: Secondary | ICD-10-CM | POA: Diagnosis not present

## 2021-01-09 NOTE — Addendum Note (Signed)
Addended by: Cloyd Stagers on: 01/09/2021 10:47 AM   Modules accepted: Orders

## 2021-01-09 NOTE — Patient Instructions (Addendum)
Go to the lab on the way out.   If you have mychart we'll likely use that to update you.    Ask urology about options to prevent stone formation.   I'll await the notes from the back clinic.   Take care.  Glad to see you.  Plan on a yearly visit in about 6 months, labs ahead of time if possible.

## 2021-01-09 NOTE — Addendum Note (Signed)
Addended by: Lindy Garczynski L on: 01/09/2021 10:47 AM   Modules accepted: Orders  

## 2021-01-09 NOTE — Addendum Note (Signed)
Addended by: Andrez Lieurance L on: 01/09/2021 10:48 AM   Modules accepted: Orders  

## 2021-01-09 NOTE — Addendum Note (Signed)
Addended by: Cloyd Stagers on: 01/09/2021 10:48 AM   Modules accepted: Orders

## 2021-01-09 NOTE — Progress Notes (Signed)
This visit occurred during the SARS-CoV-2 public health emergency.  Safety protocols were in place, including screening questions prior to the visit, additional usage of staff PPE, and extensive cleaning of exam room while observing appropriate contact time as indicated for disinfecting solutions.  Diabetes:  Using medications without difficulties: metformin, 1 a day.   Hypoglycemic episodes:no Hyperglycemic episodes:no Feet problems:no Blood Sugars averaging: usually ~130-160s.   eye exam within last year: due this fall, d/w pt.   Labs pending, d/w pt.   She is working on diet.    H/o low platelets with f/u path review and CBC pending.  No bleeding.  No bruising. Normal spleen and liver on recent imaging.    Outside imaging d/w pt.  I asked her to check with urology about med options to prevent stone formation.    Back pain, with prev imaging with plan for injection on 01/31/21.  D/w pt.    Meds, vitals, and allergies reviewed.  ROS: Per HPI unless specifically indicated in ROS section   GEN: nad, alert and oriented HEENT: ncat NECK: supple w/o LA CV: rrr. PULM: ctab, no inc wob ABD: soft, +bs EXT: no edema SKIN: no acute rash  Diabetic foot exam: Normal inspection No skin breakdown No calluses  Normal DP pulses Normal sensation to light touch and monofilament Nails normal

## 2021-01-10 ENCOUNTER — Encounter: Payer: Self-pay | Admitting: Family Medicine

## 2021-01-11 NOTE — Assessment & Plan Note (Signed)
Outside imaging discussed with patient.  I asked her to check with urology about med options to prevent stone formation.

## 2021-01-11 NOTE — Assessment & Plan Note (Addendum)
Outside imaging discussed with patient.  Report scanned.  We will plan for follow-up/injection in June.

## 2021-01-11 NOTE — Assessment & Plan Note (Signed)
Continue metformin.  See notes on labs.  Okay for outpatient follow-up.  Continue work on diet and exercise.

## 2021-01-11 NOTE — Assessment & Plan Note (Signed)
With follow-up CBC pending.  See notes on labs.  No bleeding.  Normal spleen and liver on recent imaging.

## 2021-01-12 ENCOUNTER — Other Ambulatory Visit (INDEPENDENT_AMBULATORY_CARE_PROVIDER_SITE_OTHER): Payer: BC Managed Care – PPO

## 2021-01-12 ENCOUNTER — Other Ambulatory Visit: Payer: Self-pay

## 2021-01-12 ENCOUNTER — Other Ambulatory Visit: Payer: Self-pay | Admitting: Family Medicine

## 2021-01-12 DIAGNOSIS — R7989 Other specified abnormal findings of blood chemistry: Secondary | ICD-10-CM | POA: Diagnosis not present

## 2021-01-12 DIAGNOSIS — R899 Unspecified abnormal finding in specimens from other organs, systems and tissues: Secondary | ICD-10-CM

## 2021-01-12 LAB — CBC WITH DIFFERENTIAL/PLATELET
Basophils Absolute: 0 10*3/uL (ref 0.0–0.1)
Basophils Relative: 0.5 % (ref 0.0–3.0)
Eosinophils Absolute: 0.1 10*3/uL (ref 0.0–0.7)
Eosinophils Relative: 2.7 % (ref 0.0–5.0)
HCT: 33.9 % — ABNORMAL LOW (ref 36.0–46.0)
Hemoglobin: 11.4 g/dL — ABNORMAL LOW (ref 12.0–15.0)
Lymphocytes Relative: 17.8 % (ref 12.0–46.0)
Lymphs Abs: 0.9 10*3/uL (ref 0.7–4.0)
MCHC: 33.7 g/dL (ref 30.0–36.0)
MCV: 85 fl (ref 78.0–100.0)
Monocytes Absolute: 0.3 10*3/uL (ref 0.1–1.0)
Monocytes Relative: 5 % (ref 3.0–12.0)
Neutro Abs: 3.8 10*3/uL (ref 1.4–7.7)
Neutrophils Relative %: 74 % (ref 43.0–77.0)
Platelets: 151.8 10*3/uL (ref 150.0–400.0)
RBC: 3.99 Mil/uL (ref 3.87–5.11)
RDW: 14.8 % (ref 11.5–15.5)
WBC: 5.7 10*3/uL (ref 4.0–10.5)

## 2021-01-14 LAB — CBC WITH DIFFERENTIAL/PLATELET
Absolute Monocytes: 269 cells/uL (ref 200–950)
Basophils Absolute: 28 cells/uL (ref 0–200)
Basophils Relative: 0.5 %
Eosinophils Absolute: 151 cells/uL (ref 15–500)
Eosinophils Relative: 2.7 %
HCT: 38.9 % (ref 35.0–45.0)
Hemoglobin: 12.6 g/dL (ref 11.7–15.5)
Lymphs Abs: 1120 cells/uL (ref 850–3900)
MCH: 28.1 pg (ref 27.0–33.0)
MCHC: 32.4 g/dL (ref 32.0–36.0)
MCV: 86.8 fL (ref 80.0–100.0)
MPV: 12.1 fL (ref 7.5–12.5)
Monocytes Relative: 4.8 %
Neutro Abs: 4032 cells/uL (ref 1500–7800)
Neutrophils Relative %: 72 %
RBC: 4.48 10*6/uL (ref 3.80–5.10)
RDW: 13.6 % (ref 11.0–15.0)
Total Lymphocyte: 20 %
WBC: 5.6 10*3/uL (ref 3.8–10.8)

## 2021-01-14 LAB — HEPATITIS C ANTIBODY
Hepatitis C Ab: NONREACTIVE
SIGNAL TO CUT-OFF: 0.03 (ref <1.00)

## 2021-01-14 LAB — HEMOGLOBIN A1C
Hgb A1c MFr Bld: 6.4 % of total Hgb — ABNORMAL HIGH (ref <5.7)
Mean Plasma Glucose: 137 mg/dL
eAG (mmol/L): 7.6 mmol/L

## 2021-01-14 LAB — URINALYSIS, ROUTINE W REFLEX MICROSCOPIC
Bilirubin Urine: NEGATIVE
Glucose, UA: NEGATIVE
Hgb urine dipstick: NEGATIVE
Ketones, ur: NEGATIVE
Leukocytes,Ua: NEGATIVE
Nitrite: NEGATIVE
Protein, ur: NEGATIVE
Specific Gravity, Urine: 1.007 (ref 1.001–1.035)
pH: 6 (ref 5.0–8.0)

## 2021-01-14 LAB — HIV ANTIBODY (ROUTINE TESTING W REFLEX): HIV 1&2 Ab, 4th Generation: REACTIVE — AB

## 2021-01-14 LAB — HIV 1 RNA, QL RT PCR: HIV-1 RNA, Qualitative, TMA: NOT DETECTED

## 2021-01-14 LAB — HIV-1/2 AB - DIFFERENTIATION
HIV-1 antibody: NEGATIVE
HIV-2 Ab: NEGATIVE

## 2021-01-14 LAB — PATHOLOGIST SMEAR REVIEW

## 2021-01-17 ENCOUNTER — Telehealth (INDEPENDENT_AMBULATORY_CARE_PROVIDER_SITE_OTHER): Payer: BC Managed Care – PPO | Admitting: Family Medicine

## 2021-01-17 ENCOUNTER — Encounter: Payer: Self-pay | Admitting: Family Medicine

## 2021-01-17 ENCOUNTER — Other Ambulatory Visit: Payer: Self-pay

## 2021-01-17 VITALS — Temp 99.6°F | Ht 63.0 in | Wt 202.0 lb

## 2021-01-17 DIAGNOSIS — U071 COVID-19: Secondary | ICD-10-CM | POA: Diagnosis not present

## 2021-01-17 MED ORDER — NIRMATRELVIR/RITONAVIR (PAXLOVID)TABLET: 3.0000 | ORAL_TABLET | Freq: Two times a day (BID) | ORAL | 0 refills | Status: AC

## 2021-01-17 MED ORDER — GUAIFENESIN-CODEINE 100-10 MG/5ML PO SYRP: 5.0000 mL | ORAL_SOLUTION | Freq: Every evening | ORAL | 0 refills | Status: DC | PRN

## 2021-01-17 NOTE — Progress Notes (Signed)
VIRTUAL VISIT Due to national recommendations of social distancing due to [place] 19, a virtual visit is felt to be most appropriate for this patient at this time.   I connected with the patient on 01/17/21 at  2:00 PM EDT by virtual telehealth platform and verified that I am speaking with the correct person using two identifiers.   I discussed the limitations, risks, security and privacy concerns of performing an evaluation and management service by  virtual telehealth platform and the availability of in person appointments. I also discussed with the patient that there may be a patient responsible charge related to this service. The patient expressed understanding and agreed to proceed.  Patient location: Home Provider Location: [facility] Participants: Eliezer Lofts and Angelena Form   Chief Complaint  Patient presents with  . covid symptoms  . Covid Positive  . Headache  . Cough  . Nasal Congestion  . chest congestion    History of Present Illness: 60 year old female  With history of HTN, DM with COVID infection. Date of Onset: 01/15/21 Started with nasal congestion and low grade fever. Progressed to head congestion, headache, fatgiue.  Has had more cough and chest congestion. She is feeling fatigued, no DOB or shortness of breath.  Delsym is helping some, mild chest tightness.  No chest pain.  Having myalgia.  Using tylenol, claritin.  Sleeping well at night.  No chronic lung disease,  She is not on meds that decrease immune system.  GFR>60  COVID 19 screen COVID testing: home test 5/29 COVID vaccine: 07/12/2020 , 11/30/2019 , 11/09/2019 COVID exposure: No recent travel or known exposure to [place] except for husband last week  The importance of social distancing was discussed today.    Review of Systems  HENT: Positive for congestion and sinus pain.   Respiratory: Positive for cough. Negative for shortness of breath and wheezing.       Past Medical History:   Diagnosis Date  . Allergy    seasonal  . Arthritis    ankles and back, s/p epidural injections  . 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   . Back pain   . Clotting disorder ([place])    history of 2 blood clots around surgery  . Constipation    chronic- takes OTC med that helps at times and others not   . Depression   . Diabetes mellitus without complication ([place])   . DVT (deep venous thrombosis), right 2000   rt knee-ankle  . GERD (gastroesophageal reflux disease)    IBS  . Hyperlipidemia   . Hypertension    not medicated  . Insomnia   . Thyroid disease    hypothyroidism    reports that she quit smoking about 10 years ago. She has a 20.00 pack-year smoking history. She has never used smokeless tobacco. She reports current alcohol use. She reports that she does not use drugs.   Current Outpatient Medications:  .  Accu-Chek FastClix Lancets MISC, Use to test blood sugar daily. DX: E11.9, Disp: 102 each, Rfl: 3 .  Blood Glucose Monitoring Suppl (ACCU-CHEK GUIDE) w/Device KIT, 1 kit by Subdermal route daily. Use to test blood sugar daily. DX: E11.9, Disp: 1 kit, Rfl: 0 .  Clobetasol Propionate 0.05 % shampoo, Apply 1 application topically daily., Disp: 118 mL, Rfl: 2 .  cyclobenzaprine (FLEXERIL) 10 MG tablet, Take 0.5-1 tablets by mouth  at bedtime., Disp: , Rfl:  .  diclofenac (VOLTAREN) 50 MG EC tablet, , Disp: , Rfl:  .  esomeprazole (NEXIUM) 20 MG capsule, Take 1 capsule (20 mg total) by mouth daily as needed., Disp: , Rfl:  .  glucose blood (ACCU-CHEK GUIDE) test strip, Use as instructed to test blood sugar once daily and as needed.  Diagnosis:  E11.9  Non insulin dependent., Disp: 100 each, Rfl: 3 .  metFORMIN (GLUCOPHAGE-XR) 500 MG 24 hr tablet, TAKE 1 TABLET (574m) BY MOUTH DAILY WITH BREAKFAST., Disp: 90 tablet, Rfl: 1 .  PENNSAID 2 % SOLN,  SMARTSIG:1 Pump Topical 3 Times Daily PRN, Disp: , Rfl:  .  RESTASIS 0.05 % ophthalmic emulsion, , Disp: , Rfl:  .  rosuvastatin (CRESTOR) 10 MG tablet, TAKE 1 TABLET DAILY, Disp: 90 tablet, Rfl: 1 .  SYNTHROID 100 MCG tablet, TAKE 1 TABLET DAILY, Disp: 90 tablet, Rfl: 2 .  temazepam (RESTORIL) 15 MG capsule, Take 1-2 capsules (15-30 mg total) by mouth at bedtime as needed for sleep., Disp: 180 capsule, Rfl: 1 .  traMADol (ULTRAM) 50 MG tablet, 1 po q 8 hours prn pain, Disp: 20 tablet, Rfl: 0   Observations/Objective: Temperature 99.6 F (37.6 C), height _0  (1.6 m), weight 202 lb (91.6 kg), SpO2 96 %.  Physical Exam  Physical Exam Constitutional:      General: The patient is not in acute distress. Pulmonary:     Effort: Pulmonary effort is normal. No respiratory distress.  Neurological:     Mental Status: The patient is alert and oriented to person, place, and time.  Psychiatric:        Mood and Affect: Mood normal.        Behavior: Behavior normal.   Assessment and Plan Problem List Items Addressed This Visit    COVID-19 virus infection - Primary    COVID19  infection . No clear sign of bacterial infection at this time.  No SOB.  No red flags/need for ER visit or in-person exam at respiratory clinic at this time..    Pt high risk for COVID complications given  HTN, DM, obesity and age.  Reviewed options for antiviral treatment.  GFR > 60, no hepatic impairment. Will start paxlovid x 5 days. Reviewed med interactions.. she will hold statin and start paxlovid 12 hours after last dose. She will use 1/2 dose of cough suppressant given increase activity of codeine possible with Paxlovid..Marland Kitchenif oversedating use delsym BID instead.  She does not take tramadol. Any longer.. removed from med list.  If SOB begins symptoms worsening.. have low threshold for in-person exam, if severe shortness of breath ER visit recommended.  Can monitor Oxygen saturation at home with home monitor if  able to obtain.  Go to ER if O2 sat < 90% on room air.  Reviewed home care and provided information through [place]  Recommended quarantine until test returns. If returns positive 5 days isolation recommended. Return to work day 6 and wear mask for 4 more days to complete 10 days. Provided info about prevention of spread of COVID 19.       Relevant Medications   nirmatrelvir/ritonavir EUA (PAXLOVID) TABS         I discussed the assessment and treatment plan with the patient. The patient was provided an opportunity to ask questions and all were answered. The patient agreed with the plan and demonstrated an understanding of the instructions.   The patient was advised to call back or  seek an in-person evaluation if the symptoms worsen or if the condition fails to improve as anticipated.     Eliezer Lofts, MD

## 2021-01-17 NOTE — Patient Instructions (Signed)
Person Under Monitoring Name: Christine Norman  Location: 2020 [place] [address]   Infection Prevention Recommendations for Individuals Confirmed to have, or Being Evaluated for, 2019 Novel Coronavirus (COVID-19) Infection Who Receive Care at Home  Individuals who are confirmed to have, or are being evaluated for, COVID-19 should follow the prevention steps below until a healthcare provider or local or state health department says they can return to normal activities.  Stay home except to get medical care You should restrict activities outside your home, except for getting medical care. Do not go to work, school, or public areas, and do not use public transportation or taxis.  Call ahead before visiting your doctor Before your medical appointment, call the healthcare provider and tell them that you have, or are being evaluated for, COVID-19 infection. This will help the healthcare provider's office take steps to keep other people from getting infected. Ask your healthcare provider to call the local or state health department.  Monitor your symptoms Seek prompt medical attention if your illness is worsening (e.g., difficulty breathing). Before going to your medical appointment, call the healthcare provider and tell them that you have, or are being evaluated for, COVID-19 infection. Ask your healthcare provider to call the local or state health department.  Wear a facemask You should wear a facemask that covers your nose and mouth when you are in the same room with other people and when you visit a healthcare provider. People who live with or visit you should also wear a facemask while they are in the same room with you.  Separate yourself from other people in your home As much as possible, you should stay in a different room from other people in your home. Also, you should use a separate bathroom, if available.  Avoid sharing household items You should not  share dishes, drinking glasses, cups, eating utensils, towels, bedding, or other items with other people in your home. After using these items, you should wash them thoroughly with soap and water.  Cover your coughs and sneezes Cover your mouth and nose with a tissue when you cough or sneeze, or you can cough or sneeze into your sleeve. Throw used tissues in a lined trash can, and immediately wash your hands with soap and water for at least 20 seconds or use an alcohol-based hand rub.  Wash your [facility] your hands often and thoroughly with soap and water for at least 20 seconds. You can use an alcohol-based hand sanitizer if soap and water are not available and if your hands are not visibly dirty. Avoid touching your eyes, nose, and mouth with unwashed hands.   Prevention Steps for Caregivers and Household Members of Individuals Confirmed to have, or Being Evaluated for, COVID-19 Infection Being Cared for in the Home  If you live with, or provide care at home for, a person confirmed to have, or being evaluated for, COVID-19 infection please follow these guidelines to prevent infection:  Follow healthcare provider's instructions Make sure that you understand and can help the patient follow any healthcare provider instructions for all care.  Provide for the patient's basic needs You should help the patient with basic needs in the home and provide support for getting groceries, prescriptions, and other personal needs.  Monitor the patient's symptoms If they are getting sicker, call his or her medical provider and tell them that the patient has, or is being evaluated for, COVID-19 infection. This will help the healthcare provider's  office take steps to keep other people from getting infected. Ask the healthcare provider to call the local or state health department.  Limit the number of people who have contact with the patient  If possible, have only one caregiver for the  patient.  Other household members should stay in another home or place of residence. If this is not possible, they should stay  in another room, or be separated from the patient as much as possible. Use a separate bathroom, if available.  Restrict visitors who do not have an essential need to be in the home.  Keep older adults, very young children, and other sick people away from the patient Keep older adults, very young children, and those who have compromised immune systems or chronic health conditions away from the patient. This includes people with chronic heart, lung, or kidney conditions, diabetes, and cancer.  Ensure good ventilation Make sure that shared spaces in the home have good air flow, such as from an air conditioner or an opened window, weather permitting.  Wash your hands often  Wash your hands often and thoroughly with soap and water for at least 20 seconds. You can use an alcohol based hand sanitizer if soap and water are not available and if your hands are not visibly dirty.  Avoid touching your eyes, nose, and mouth with unwashed hands.  Use disposable paper towels to dry your hands. If not available, use dedicated cloth towels and replace them when they become wet.  Wear a facemask and gloves  Wear a disposable facemask at all times in the room and gloves when you touch or have contact with the patient's blood, body fluids, and/or secretions or excretions, such as sweat, saliva, sputum, nasal mucus, vomit, urine, or feces.  Ensure the mask fits over your nose and mouth tightly, and do not touch it during use.  Throw out disposable facemasks and gloves after using them. Do not reuse.  Wash your hands immediately after removing your facemask and gloves.  If your personal clothing becomes contaminated, carefully remove clothing and launder. Wash your hands after handling contaminated clothing.  Place all used disposable facemasks, gloves, and other waste in a lined  container before disposing them with other household waste.  Remove gloves and wash your hands immediately after handling these items.  Do not share dishes, glasses, or other household items with the patient  Avoid sharing household items. You should not share dishes, drinking glasses, cups, eating utensils, towels, bedding, or other items with a patient who is confirmed to have, or being evaluated for, COVID-19 infection.  After the person uses these items, you should wash them thoroughly with soap and water.  Wash laundry thoroughly  Immediately remove and wash clothes or bedding that have blood, body fluids, and/or secretions or excretions, such as sweat, saliva, sputum, nasal mucus, vomit, urine, or feces, on them.  Wear gloves when handling laundry from the patient.  Read and follow directions on labels of laundry or clothing items and detergent. In general, wash and dry with the warmest temperatures recommended on the label.  Clean all areas the individual has used often  Clean all touchable surfaces, such as counters, tabletops, doorknobs, bathroom fixtures, toilets, phones, keyboards, tablets, and bedside tables, every day. Also, clean any surfaces that may have blood, body fluids, and/or secretions or excretions on them.  Wear gloves when cleaning surfaces the patient has come in contact with.  Use a diluted bleach solution (e.g., dilute bleach with 1  part bleach and 10 parts water) or a household disinfectant with a label that says EPA-registered for coronaviruses. To make a bleach solution at home, add 1 tablespoon of bleach to 1 quart (4 cups) of water. For a larger supply, add  cup of bleach to 1 gallon (16 cups) of water.  Read labels of cleaning products and follow recommendations provided on product labels. Labels contain instructions for safe and effective use of the cleaning product including precautions you should take when applying the product, such as wearing gloves or  eye protection and making sure you have good ventilation during use of the product.  Remove gloves and wash hands immediately after cleaning.  Monitor yourself for signs and symptoms of illness Caregivers and household members are considered close contacts, should monitor their health, and will be asked to limit movement outside of the home to the extent possible. Follow the monitoring steps for close contacts listed on the symptom monitoring form.   ? If you have additional questions, contact your local health department or call the epidemiologist on call at [phone] (available 24/7). ? This guidance is subject to change. For the most up-to-date guidance from [facility], please refer to their website: [email or web address]

## 2021-01-17 NOTE — Assessment & Plan Note (Signed)
COVID19  infection . No clear sign of bacterial infection at this time.  No SOB.  No red flags/need for ER visit or in-person exam at respiratory clinic at this time..    Pt high risk for COVID complications given  HTN, DM, obesity and age.  Reviewed options for antiviral treatment.  GFR > 60, no hepatic impairment. Will start paxlovid x 5 days. Reviewed med interactions.. she will hold statin and start paxlovid 12 hours after last dose. She will use 1/2 dose of cough suppressant given increase activity of codeine possible with Paxlovid.Marland Kitchen if oversedating use delsym BID instead.  She does not take tramadol. Any longer.. removed from med list.  If SOB begins symptoms worsening.. have low threshold for in-person exam, if severe shortness of breath ER visit recommended.  Can monitor Oxygen saturation at home with home monitor if able to obtain.  Go to ER if O2 sat < 90% on room air.  Reviewed home care and provided information through [place].  Recommended quarantine until test returns. If returns positive 5 days isolation recommended. Return to work day 6 and wear mask for 4 more days to complete 10 days. Provided info about prevention of spread of COVID 19.

## 2021-02-04 ENCOUNTER — Other Ambulatory Visit: Payer: Self-pay | Admitting: Internal Medicine

## 2021-02-04 NOTE — Telephone Encounter (Signed)
Non delegated med - pt of [facility] Requested Prescriptions  Pending Prescriptions Disp Refills   cyclobenzaprine (FLEXERIL) 5 MG tablet [Pharmacy Med Name: CYCLOBENZAPRINE 5 MG TABLET] 15 tablet 0    Sig: TAKE 1 TABLET BY MOUTH AT BEDTIME AS NEEDED FOR MUSCLE SPASMS.      There is no refill protocol information for this order

## 2021-02-07 NOTE — Telephone Encounter (Signed)
LOV 01/09/21; okay to refill?

## 2021-02-08 NOTE — Telephone Encounter (Signed)
Sent. Thanks.   

## 2021-02-27 ENCOUNTER — Other Ambulatory Visit: Payer: Self-pay | Admitting: Family Medicine

## 2021-02-27 DIAGNOSIS — G4700 Insomnia, unspecified: Secondary | ICD-10-CM

## 2021-03-01 ENCOUNTER — Encounter: Payer: Self-pay | Admitting: Family Medicine

## 2021-03-01 MED ORDER — ROSUVASTATIN CALCIUM 10 MG PO TABS: 10.0000 mg | ORAL_TABLET | Freq: Every day | ORAL | 3 refills | Status: DC

## 2021-03-01 NOTE — Telephone Encounter (Signed)
See Refill Request

## 2021-03-01 NOTE — Telephone Encounter (Signed)
Last office visit 01/17/2021 (video) with Dr. Diona Browner for Covid.  Last refilled 09/21/2020 for for #180 with 1 refill.  No future appointments with PCP.

## 2021-03-01 NOTE — Telephone Encounter (Signed)
Sent. Thanks.   

## 2021-03-08 IMAGING — CT CT RENAL STONE PROTOCOL
2 of 4 series · 16 of 46 positions shown, 18 images · non-contrast
Comparison: None.

CLINICAL DATA: 59-year-old female with left back and flank pain
radiating to the abdomen for 2 hours. Unable to void.

EXAM:
CT ABDOMEN AND PELVIS WITHOUT CONTRAST
TECHNIQUE: Multidetector CT imaging of the abdomen and pelvis was performed
following the standard protocol without IV contrast.

[Series 2: axial st · axial · 0.76mm/px · z∈[+1484,+1854]mm · 13 of 84 slices shown, 15 images]
[im 5/84  soft-tissue]
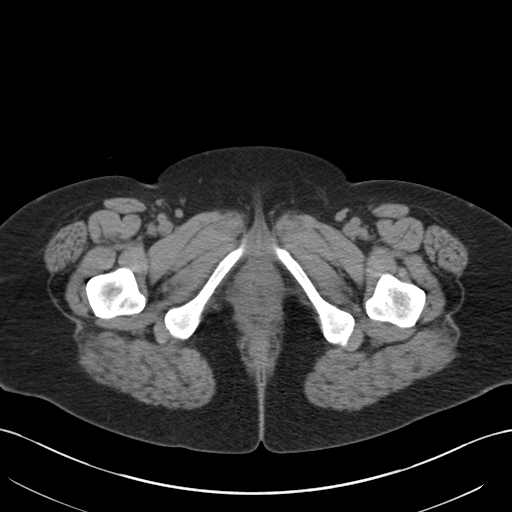
[im 5/84  bone]
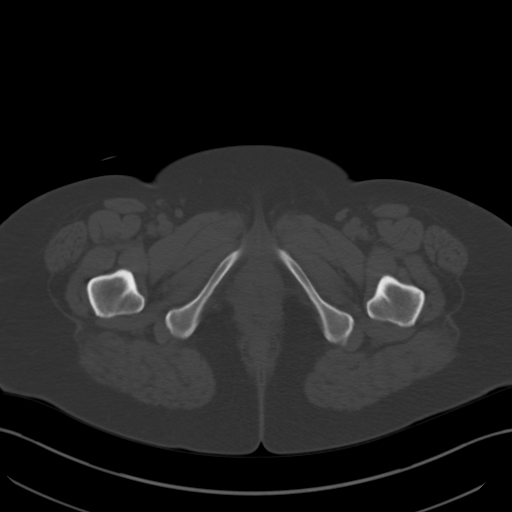
[im 14/84  soft-tissue]
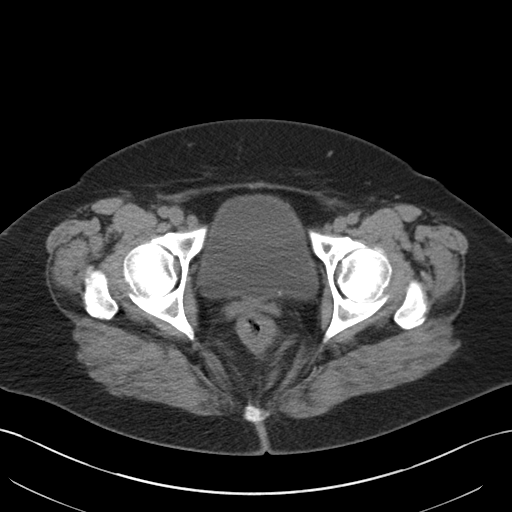
[im 18/84  soft-tissue]
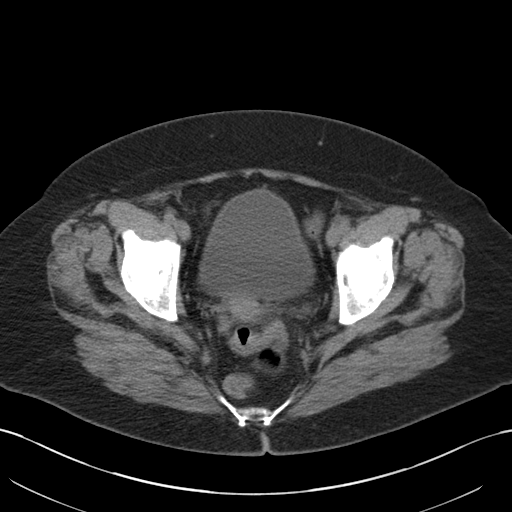
[im 22/84  soft-tissue]
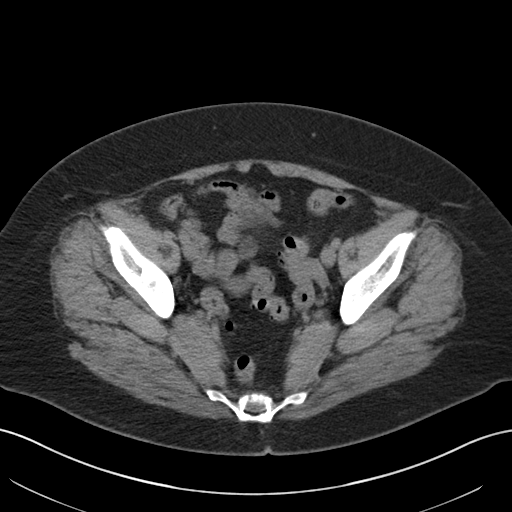
[im 31/84  soft-tissue]
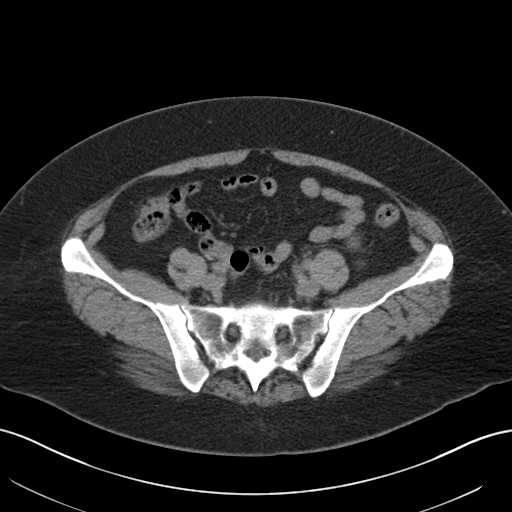
[im 35/84  soft-tissue]
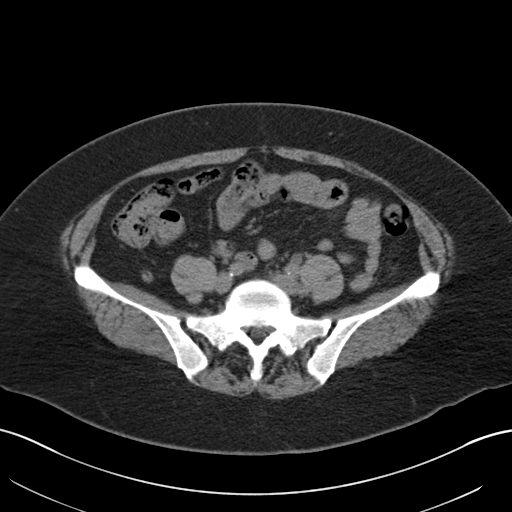
[im 44/84  soft-tissue]
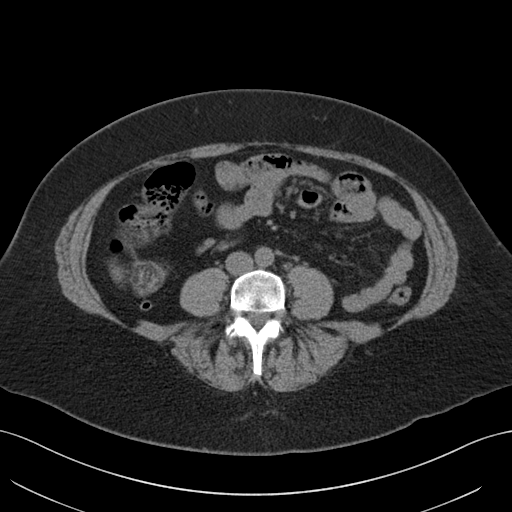
[im 49/84  soft-tissue]
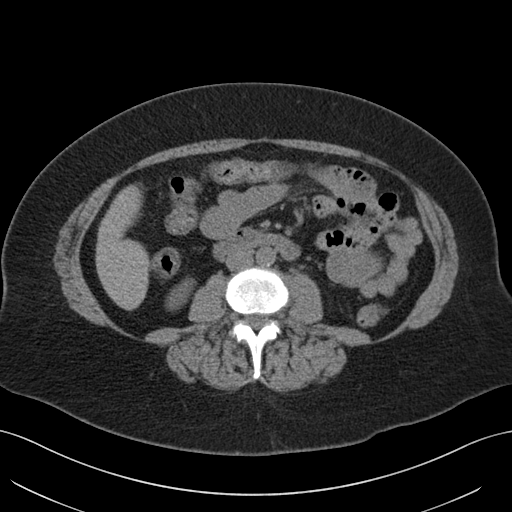
[im 53/84  soft-tissue]
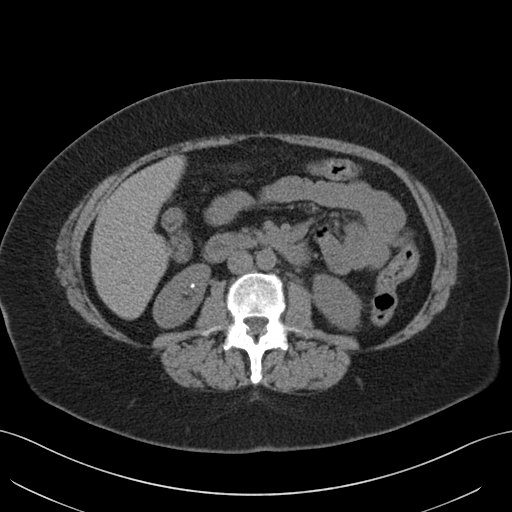
[im 53/84  bone]
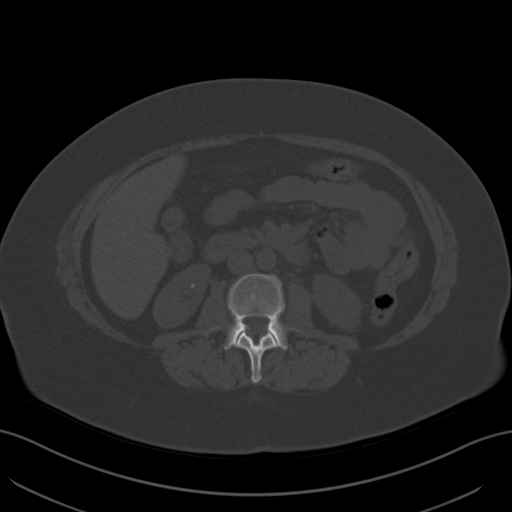
[im 62/84  soft-tissue]
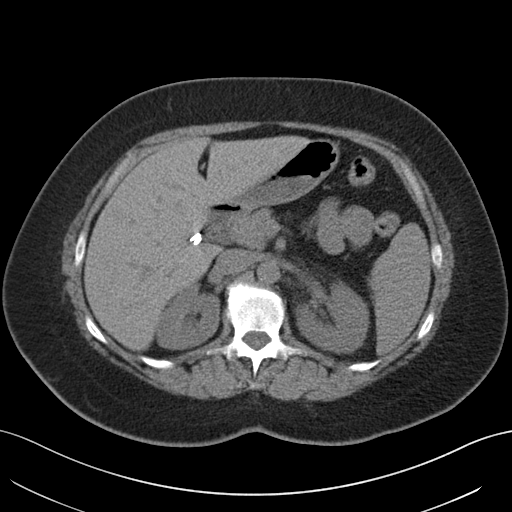
[im 66/84  soft-tissue]
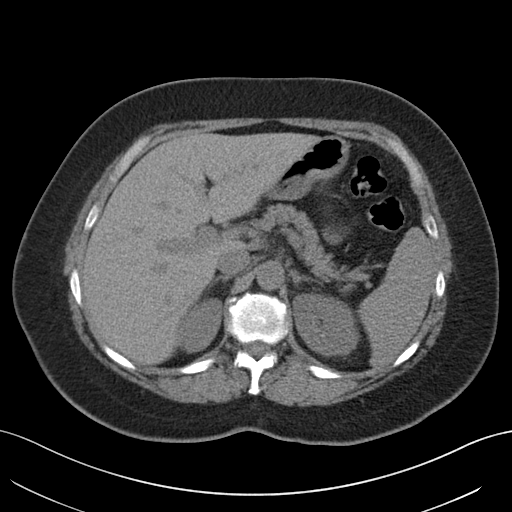
[im 70/84  soft-tissue]
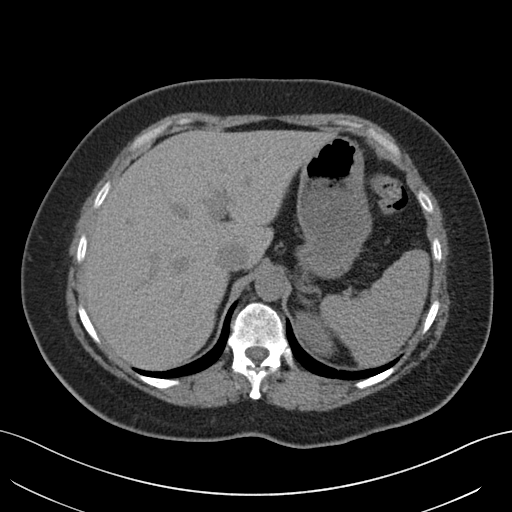
[im 79/84  soft-tissue]
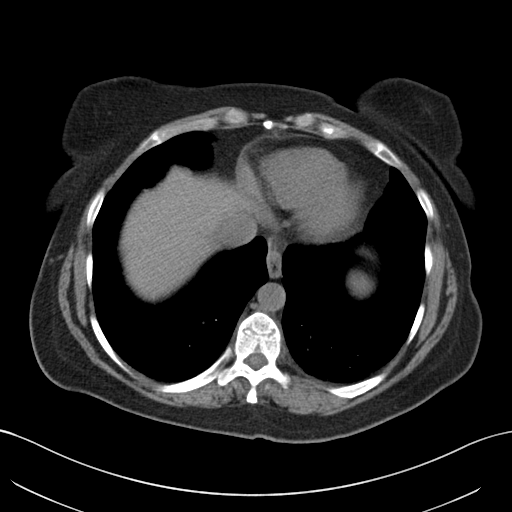

[Series 4: coronal · coronal · 0.81mm/px · 3 of 158 slices shown]
[im 53/158  soft-tissue]
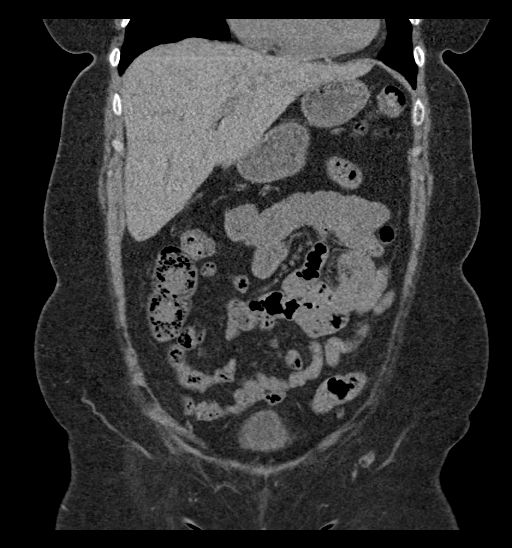
[im 70/158  soft-tissue]
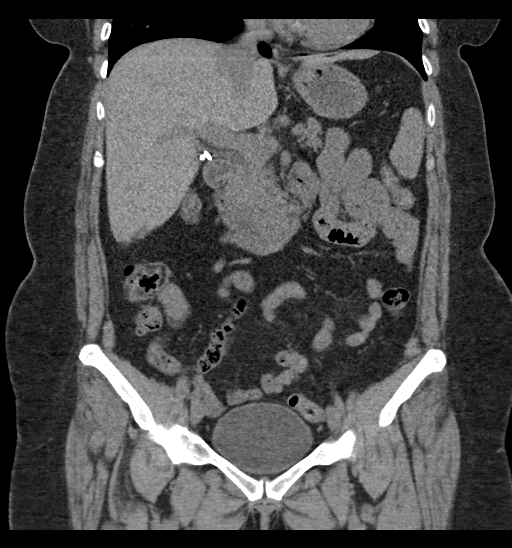
[im 88/158  soft-tissue]
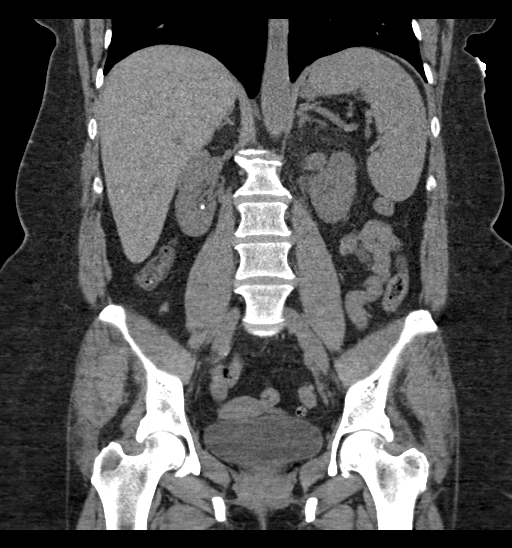

[16 of 46 positions shown; findings below may reference images not displayed]

FINDINGS: Lower chest: Negative; minimal atelectasis in the costophrenic
angles.

Hepatobiliary: Surgically absent gallbladder. Negative noncontrast
liver.

Pancreas: Negative.

Spleen: Negative.

Adrenals/Urinary Tract: Normal adrenal glands.

Right nephrolithiasis in the mid and lower pole, punctate to 3 mm.
No right hydronephrosis or stranding. Decompressed visible right
ureter to the level of the pelvis.

Mild left hydronephrosis and hydroureter with perinephric stranding.
Punctate left renal lower pole calculus. Left periureteral stranding
continues into the pelvis, and a 2-3 mm calculus is identified just
inside the urinary bladder at the left UVJ (series 2, image 70).
Unremarkable urinary bladder otherwise.

Stomach/Bowel: Negative rectum. Mild sigmoid redundancy with
diverticulosis, no active inflammation. Mildly redundant but
otherwise negative upstream colon. Normal retrocecal appendix on
series 2, image 45. Negative terminal ileum. No dilated small bowel.
Negative stomach. No free air, free fluid.

Vascular/Lymphatic: Vascular patency is not evaluated in the absence
of IV contrast. Mild Aortoiliac calcified atherosclerosis. No
lymphadenopathy.

Reproductive: Negative noncontrast appearance.

Other: No pelvic free fluid.

Musculoskeletal: Lower lumbar facet arthropathy. No acute osseous
abnormality identified.
IMPRESSION: 1. Acute obstructive uropathy on the left with a 2-3 mm calculus
just inside the urinary bladder at the left UVJ.
2. Bilateral nephrolithiasis.
3. Mild aortic atherosclerosis.  Sigmoid diverticulosis.

## 2021-04-05 ENCOUNTER — Other Ambulatory Visit: Payer: Self-pay | Admitting: Family Medicine

## 2021-04-13 ENCOUNTER — Other Ambulatory Visit: Payer: Self-pay | Admitting: Family Medicine

## 2021-04-13 DIAGNOSIS — E119 Type 2 diabetes mellitus without complications: Secondary | ICD-10-CM

## 2021-04-18 ENCOUNTER — Ambulatory Visit (INDEPENDENT_AMBULATORY_CARE_PROVIDER_SITE_OTHER): Payer: BC Managed Care – PPO | Admitting: Dermatology

## 2021-04-18 ENCOUNTER — Other Ambulatory Visit: Payer: Self-pay

## 2021-04-18 ENCOUNTER — Encounter: Payer: Self-pay | Admitting: Dermatology

## 2021-04-18 DIAGNOSIS — D1801 Hemangioma of skin and subcutaneous tissue: Secondary | ICD-10-CM

## 2021-04-18 DIAGNOSIS — Z808 Family history of malignant neoplasm of other organs or systems: Secondary | ICD-10-CM

## 2021-04-18 DIAGNOSIS — L573 Poikiloderma of Civatte: Secondary | ICD-10-CM

## 2021-04-18 DIAGNOSIS — Z1283 Encounter for screening for malignant neoplasm of skin: Secondary | ICD-10-CM

## 2021-04-18 DIAGNOSIS — L409 Psoriasis, unspecified: Secondary | ICD-10-CM

## 2021-04-18 DIAGNOSIS — Z86018 Personal history of other benign neoplasm: Secondary | ICD-10-CM

## 2021-04-18 DIAGNOSIS — L309 Dermatitis, unspecified: Secondary | ICD-10-CM

## 2021-04-18 MED ORDER — CLOBETASOL PROPIONATE 0.05 % EX FOAM: Freq: Two times a day (BID) | CUTANEOUS | 6 refills | Status: DC

## 2021-04-18 NOTE — Patient Instructions (Signed)
Lotrimin cream hydrocortisone cream in the ears  Follow 4-6 weeks.

## 2021-04-25 ENCOUNTER — Encounter: Payer: Self-pay | Admitting: Dermatology

## 2021-04-25 NOTE — Progress Notes (Signed)
   Follow-Up Visit   Subjective  Christine Norman is a 60 y.o. female who presents for the following: Annual Exam (Patient here today for yearly skin check. Patient has a discoloration under her neck x 1 year. Patient also has a lesion on the left upper arm x 3 months that she would like checked, no bleeding, no pain. Per patient she was diagnosed with psoriasis by her PCP about 6 months ago at the base of her posterior neck and she was giving Clobetasol Shampoo that's not helping. Patient wants to know what else she can do for it. Personal history of atypical mole. Family history of non mole skin cancer. ).  General skin examination, multiple areas she would like checked particularly the rash on back of scalp. Location:  Duration:  Quality:  Associated Signs/Symptoms: Modifying Factors:  Severity:  Timing: Context:   Objective  Well appearing patient in no apparent distress; mood and affect are within normal limits. Right Lower Back Full body skin exam. No atypical moles or non mole skin cancers.   Mid Back Multiple 1 to 2 mm smooth red dermal papules  Neck - Posterior Mottled pink-tan discoloration on sun exposed areas of sides of neck and V of chest.  Scalp Patch of moderately thick psoriasiform dermatitis posterior scalp that represents an anterior ankle neurodermatitis psoriasis.  This is mostly postmenopausal white females, typically involves the occipital area and does not spread to other locations, and scratching essentially completely prevents any clinical improvement.  If topical therapy fails we will consider intralesional injections.  Left Ear, Right Ear This pink scale benefit seborrheic dermatitis than true psoriasis.    A full examination was performed including scalp, head, eyes, ears, nose, lips, neck, chest, axillae, abdomen, back, buttocks, bilateral upper extremities, bilateral lower extremities, hands, feet, fingers, toes, fingernails, and toenails. All findings within  normal limits unless otherwise noted below.  Is beneath undergarments not fully examined.   Assessment & Plan    Screening for malignant neoplasm of skin Right Lower Back  Yearly skin exams.  Encouraged to self examine twice annually.  Continue ultraviolet protection.  Hemangioma of skin Mid Back  Benign no treatment needed.   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  For now no intervention initiated.  Psoriasis Scalp  Clobetasol foam daily for 1 month.  Discussed ways to minimize scratching.  Follow-up by after that time.  clobetasol (OLUX) 0.05 % topical foam - Scalp Apply topically 2 (two) times daily.  Dermatitis Left Ear; Right Ear  Lotrimin and hydrocortisone OTC daily for both ears. Follow 4-6 weeks if patient is better she can cancel.      I, Lavonna Monarch, MD, have reviewed all documentation for this visit.  The documentation on 04/25/21 for the exam, diagnosis, procedures, and orders are all accurate and complete.

## 2021-05-21 ENCOUNTER — Other Ambulatory Visit: Payer: Self-pay | Admitting: Family Medicine

## 2021-05-21 DIAGNOSIS — E119 Type 2 diabetes mellitus without complications: Secondary | ICD-10-CM

## 2021-06-01 ENCOUNTER — Ambulatory Visit: Payer: BC Managed Care – PPO | Admitting: Dermatology

## 2021-06-07 ENCOUNTER — Encounter: Payer: Self-pay | Admitting: Family Medicine

## 2021-06-08 ENCOUNTER — Encounter: Payer: BC Managed Care – PPO | Admitting: Family Medicine

## 2021-06-22 ENCOUNTER — Encounter: Payer: BC Managed Care – PPO | Admitting: Family Medicine

## 2021-06-26 ENCOUNTER — Ambulatory Visit: Payer: BC Managed Care – PPO | Admitting: Family Medicine

## 2021-06-28 LAB — HM DIABETES EYE EXAM

## 2021-07-20 ENCOUNTER — Other Ambulatory Visit: Payer: Self-pay

## 2021-07-20 ENCOUNTER — Encounter: Payer: Self-pay | Admitting: Primary Care

## 2021-07-20 ENCOUNTER — Telehealth (INDEPENDENT_AMBULATORY_CARE_PROVIDER_SITE_OTHER): Payer: BC Managed Care – PPO | Admitting: Primary Care

## 2021-07-20 DIAGNOSIS — J069 Acute upper respiratory infection, unspecified: Secondary | ICD-10-CM | POA: Diagnosis not present

## 2021-07-20 NOTE — Progress Notes (Signed)
Patient ID: Christine Norman, female    DOB: May 24, 1961, 60 y.o.   MRN: [id number]  Virtual visit completed through [facility], a video enabled telemedicine application. Due to national recommendations of social distancing due to COVID-19, a virtual visit is felt to be most appropriate for this patient at this time. Reviewed limitations, risks, security and privacy concerns of performing a virtual visit and the availability of in person appointments. I also reviewed that there may be a patient responsible charge related to this service. The patient agreed to proceed.   Patient location: home Provider location: [place] at [facility], office Persons participating in this virtual visit: patient, provider   If any vitals were documented, they were collected by patient at home unless specified below.    Temp 98.6 F (37 C)   Ht _0  (1.6 m)   Wt 198 lb (89.8 kg)   BMI 35.07 kg/m    CC: Sinus pressure Subjective:   HPI: Christine Norman is a 60 y.o. female patient of Dr. Damita Dunnings with a history of hypertension, GERD, Covid-19 infection presenting on 07/20/2021 for sinus pressure.  She also reports nasal congestion, cough, fatigue, sore throat, chills, body, rhinorrhea. Symptom onset two days ago. She felt better initially this morning, but then started feeling lightheaded so she went to lay down.   She took a home Covid-19 test today which was negative. She's checking her pulse oxygen which 98%-100%. She denies sick contacts but has been at the hospital with her mother.   She denies fevers. She had her influenza vaccine a few weeks ago.   She took Sudafed PE last night for a moderate to severe headache without improvement so she took an Excedrin migraine pill with relief. She took another Sudafed PE today, headache is much better today.   Her husband is not sick with symptoms.       Relevant past medical, surgical, family and social history reviewed and updated as indicated. Interim medical history  since our last visit reviewed. Allergies and medications reviewed and updated. Outpatient Medications Prior to Visit  Medication Sig Dispense Refill   Accu-Chek FastClix Lancets MISC Use to test blood sugar daily. DX: E11.9 102 each 3   Blood Glucose Monitoring Suppl (ACCU-CHEK GUIDE) w/Device KIT 1 kit by Subdermal route daily. Use to test blood sugar daily. DX: E11.9 1 kit 0   clobetasol (OLUX) 0.05 % topical foam Apply topically 2 (two) times daily. 50 g 6   Clobetasol Propionate 0.05 % shampoo Apply 1 application topically daily. 118 mL 2   cyclobenzaprine (FLEXERIL) 10 MG tablet Take 0.5-1 tablets by mouth at bedtime.     diazepam (VALIUM) 5 MG tablet SMARTSIG:1 Tablet(s) By Mouth     diclofenac (VOLTAREN) 50 MG EC tablet      esomeprazole (NEXIUM) 20 MG capsule Take 1 capsule (20 mg total) by mouth daily as needed.     glucose blood (ACCU-CHEK GUIDE) test strip Use as instructed to test blood sugar once daily and as needed.  Diagnosis:  E11.9  Non insulin dependent. 100 each 3   metFORMIN (GLUCOPHAGE-XR) 500 MG 24 hr tablet TAKE 1 TABLET DAILY WITH   BREAKFAST 90 tablet 0   PENNSAID 2 % SOLN SMARTSIG:1 Pump Topical 3 Times Daily PRN     RESTASIS 0.05 % ophthalmic emulsion      rosuvastatin (CRESTOR) 10 MG tablet Take 1 tablet (10 mg total) by mouth daily. 90 tablet 3   SYNTHROID 100 MCG tablet TAKE  1 TABLET DAILY 90 tablet 2   temazepam (RESTORIL) 15 MG capsule TAKE 1 TO 2 CAPSULES AT    BEDTIME AS NEEDED FOR SLEEP 180 capsule 1   No facility-administered medications prior to visit.     Per HPI unless specifically indicated in ROS section below Review of Systems  Constitutional:  Positive for fatigue.  HENT:  Positive for congestion and sinus pressure.   Musculoskeletal:  Positive for myalgias.  Neurological:  Positive for headaches.  Objective:  Temp 98.6 F (37 C)   Ht _0  (1.6 m)   Wt 198 lb (89.8 kg)   BMI 35.07 kg/m   Wt Readings from Last 3 Encounters:  07/20/21 198  lb (89.8 kg)  01/17/21 202 lb (91.6 kg)  01/09/21 208 lb (94.3 kg)       Physical exam: General: Alert and oriented x 3, no distress, does not appear sickly  Pulmonary: Speaks in complete sentences without increased work of breathing, no cough during visit.  Psychiatric: Normal mood, thought content, and behavior.     Results for orders placed or performed in visit on 01/12/21  CBC with Differential/Platelet  Result Value Ref Range   WBC 5.7 4.0 - 10.5 K/uL   RBC 3.99 3.87 - 5.11 Mil/uL   Hemoglobin 11.4 (L) 12.0 - 15.0 g/dL   HCT 33.9 (L) 36.0 - 46.0 %   MCV 85.0 78.0 - 100.0 fl   MCHC 33.7 30.0 - 36.0 g/dL   RDW 14.8 11.5 - 15.5 %   Platelets 151.8 150.0 - 400.0 K/uL   Neutrophils Relative % 74.0 43.0 - 77.0 %   Lymphocytes Relative 17.8 12.0 - 46.0 %   Monocytes Relative 5.0 3.0 - 12.0 %   Eosinophils Relative 2.7 0.0 - 5.0 %   Basophils Relative 0.5 0.0 - 3.0 %   Neutro Abs 3.8 1.4 - 7.7 K/uL   Lymphs Abs 0.9 0.7 - 4.0 K/uL   Monocytes Absolute 0.3 0.1 - 1.0 K/uL   Eosinophils Absolute 0.1 0.0 - 0.7 K/uL   Basophils Absolute 0.0 0.0 - 0.1 K/uL   Assessment & Plan:   Problem List Items Addressed This Visit       Respiratory   Viral URI with cough    Symptoms representative of viral etiology. She actually appears well, no cough during visit.  Recommend she re-test for Covid-19 tomorrow, she agrees.  Discussed Tylenol or Ibuprofen for sore throat, body aches, headaches. Continue Delsym PRN cough. Add Flonase BID.  She will update if no improvement.        No orders of the defined types were placed in this encounter.  No orders of the defined types were placed in this encounter.   I discussed the assessment and treatment plan with the patient. The patient was provided an opportunity to ask questions and all were answered. The patient agreed with the plan and demonstrated an understanding of the instructions. The patient was advised to call back or seek an  in-person evaluation if the symptoms worsen or if the condition fails to improve as anticipated.  Follow up plan:  Nasal Congestion/Ear Pressure/Sinus Pressure: Try using Flonase (fluticasone) nasal spray. Instill 1 spray in each nostril twice daily.   Continue Delsym for cough as needed.  You may take Ibuprofen or Tylenol for sore throat.  Please update me in five days if you are not getting better and/or if you are feeling worse.   It was a pleasure to see you today!  Pleas Koch, NP

## 2021-07-20 NOTE — Patient Instructions (Signed)
Nasal Congestion/Ear Pressure/Sinus Pressure: Try using Flonase (fluticasone) nasal spray. Instill 1 spray in each nostril twice daily.   Continue Delsym for cough as needed.  You may take Ibuprofen or Tylenol for sore throat.  Please update me in five days if you are not getting better and/or if you are feeling worse.   It was a pleasure to see you today!

## 2021-07-20 NOTE — Assessment & Plan Note (Signed)
Symptoms representative of viral etiology. She actually appears well, no cough during visit.  Recommend she re-test for Covid-19 tomorrow, she agrees.  Discussed Tylenol or Ibuprofen for sore throat, body aches, headaches. Continue Delsym PRN cough. Add Flonase BID.  She will update if no improvement.

## 2021-07-24 ENCOUNTER — Encounter: Payer: BC Managed Care – PPO | Admitting: Family Medicine

## 2021-07-31 ENCOUNTER — Encounter: Payer: BC Managed Care – PPO | Admitting: Family Medicine

## 2021-08-07 ENCOUNTER — Encounter: Payer: Self-pay | Admitting: Family Medicine

## 2021-08-07 ENCOUNTER — Ambulatory Visit (INDEPENDENT_AMBULATORY_CARE_PROVIDER_SITE_OTHER): Payer: BC Managed Care – PPO | Admitting: Family Medicine

## 2021-08-07 ENCOUNTER — Other Ambulatory Visit: Payer: Self-pay

## 2021-08-07 VITALS — BP 124/70 | HR 77 | Temp 98.0°F | Ht 63.0 in | Wt 200.0 lb

## 2021-08-07 DIAGNOSIS — Z0000 Encounter for general adult medical examination without abnormal findings: Secondary | ICD-10-CM | POA: Diagnosis not present

## 2021-08-07 DIAGNOSIS — M25569 Pain in unspecified knee: Secondary | ICD-10-CM

## 2021-08-07 DIAGNOSIS — E119 Type 2 diabetes mellitus without complications: Secondary | ICD-10-CM

## 2021-08-07 DIAGNOSIS — E785 Hyperlipidemia, unspecified: Secondary | ICD-10-CM

## 2021-08-07 DIAGNOSIS — Z01818 Encounter for other preprocedural examination: Secondary | ICD-10-CM | POA: Diagnosis not present

## 2021-08-07 DIAGNOSIS — G4700 Insomnia, unspecified: Secondary | ICD-10-CM

## 2021-08-07 DIAGNOSIS — E039 Hypothyroidism, unspecified: Secondary | ICD-10-CM

## 2021-08-07 DIAGNOSIS — Z7189 Other specified counseling: Secondary | ICD-10-CM

## 2021-08-07 MED ORDER — VITAMIN D3 25 MCG (1000 UT) PO CAPS: 1000.0000 [IU] | ORAL_CAPSULE | Freq: Every day | ORAL | Status: AC

## 2021-08-07 MED ORDER — METFORMIN HCL ER 500 MG PO TB24: 500.0000 mg | ORAL_TABLET | Freq: Every day | ORAL | 3 refills | Status: DC

## 2021-08-07 MED ORDER — TEMAZEPAM 15 MG PO CAPS: ORAL_CAPSULE | ORAL | 1 refills | Status: DC

## 2021-08-07 NOTE — Patient Instructions (Addendum)
Go to the lab on the way out.   If you have mychart we'll likely use that to update you.    Take care.  Glad to see you. Hold rosuvastatin for 1-2 weeks and see if the cramps/aches get better.  It not, then restart the med.   Plan on recheck in about 6 months with labs at the visit.

## 2021-08-07 NOTE — Progress Notes (Signed)
This visit occurred during the SARS-CoV-2 public health emergency.  Safety protocols were in place, including screening questions prior to the visit, additional usage of staff PPE, and extensive cleaning of exam room while observing appropriate contact time as indicated for disinfecting solutions.  CPE- See plan.  Routine anticipatory guidance given to patient.  See health maintenance.  The possibility exists that previously documented standard health maintenance information may have been brought forward from a previous encounter into this note.  If needed, that same information has been updated to reflect the current situation based on today's encounter.    Tetanus 2020 Flu 2022 Shingles d/w pt.   covid vaccine 2022 PNA d/w pt.   DXA not due  Pap 2021 Mammogram 2021 Colonoscopy 2016.  [name] designated if patient were incapacitated.   Diet and exercise d/w pt.  limited by knee pain.    She had to put off her L knee surgery to care for her mother.  She is trying to get rescheduled when possible.  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  No CP.  Functional status limited by knee pain, but not known CV issue.  No syncope.  No h/o MI/CVA.  EKG discussed with patient at office visit.  She had back injection done last week.  That helped with nerve pain but she isn't pain free.    Diabetes:  Using medications without difficulties: yes Hypoglycemic episodes: no sx Hyperglycemic episodes: no sx Feet problems: she has some foot pain but not numbness on the distal toes- unclear if this is related to her back/gait changes.   Blood Sugars averaging:not checked often eye exam within last year: yes Labs pending.  Insomnia. No ADE on med.  It helps.  Not used every night.    Elevated Cholesterol: Using medications without problems: see below.   Muscle aches: some cramping in the legs, unclear if from statin.  D/w pt.   Diet compliance: yes Exercise: limited   Hypothyroidism.  No ADE on med.   Compliant.  No dysphagia.  Due for labs.    PMH and SH reviewed  Meds, vitals, and allergies reviewed.   ROS: Per HPI.  Unless specifically indicated otherwise in HPI, the patient denies:  General: fever. Eyes: acute vision changes ENT: sore throat Cardiovascular: chest pain Respiratory: SOB GI: vomiting GU: dysuria Musculoskeletal: acute back pain Derm: acute rash Neuro: acute motor dysfunction Psych: worsening mood Endocrine: polydipsia Heme: bleeding Allergy: hayfever  GEN: nad, alert and oriented HEENT: ncat NECK: supple w/o LA CV: rrr. PULM: ctab, no inc wob ABD: soft, +bs EXT: no edema SKIN: no acute rash  Diabetic foot exam: Normal inspection No skin breakdown No calluses except for small callous at distal L 1st MT and she is filing it down.   Normal DP pulses Normal sensation to light touch and monofilament Nails normal

## 2021-08-08 LAB — TSH: TSH: 59.05 mIU/L — ABNORMAL HIGH (ref 0.40–4.50)

## 2021-08-08 LAB — LIPID PANEL
Cholesterol: 177 mg/dL (ref <200)
HDL: 62 mg/dL (ref >=50)
LDL Cholesterol (Calc): 87 mg/dL (calc)
Non-HDL Cholesterol (Calc): 115 mg/dL (calc) (ref <130)
Total CHOL/HDL Ratio: 2.9 (calc) (ref <5.0)
Triglycerides: 184 mg/dL — ABNORMAL HIGH (ref <150)

## 2021-08-08 LAB — CBC WITH DIFFERENTIAL/PLATELET
Absolute Monocytes: 686 cells/uL (ref 200–950)
Basophils Absolute: 49 cells/uL (ref 0–200)
Basophils Relative: 0.5 %
Eosinophils Absolute: 69 cells/uL (ref 15–500)
Eosinophils Relative: 0.7 %
HCT: 40.2 % (ref 35.0–45.0)
Hemoglobin: 13 g/dL (ref 11.7–15.5)
Lymphs Abs: 1686 cells/uL (ref 850–3900)
MCH: 28.7 pg (ref 27.0–33.0)
MCHC: 32.3 g/dL (ref 32.0–36.0)
MCV: 88.7 fL (ref 80.0–100.0)
MPV: 11.9 fL (ref 7.5–12.5)
Monocytes Relative: 7 %
Neutro Abs: 7311 cells/uL (ref 1500–7800)
Neutrophils Relative %: 74.6 %
Platelets: 72 10*3/uL — ABNORMAL LOW (ref 140–400)
RBC: 4.53 10*6/uL (ref 3.80–5.10)
RDW: 13.8 % (ref 11.0–15.0)
Total Lymphocyte: 17.2 %
WBC: 9.8 10*3/uL (ref 3.8–10.8)

## 2021-08-08 LAB — COMPREHENSIVE METABOLIC PANEL
AG Ratio: 1.4 (calc) (ref 1.0–2.5)
ALT: 17 U/L (ref 6–29)
AST: 12 U/L (ref 10–35)
Albumin: 4.3 g/dL (ref 3.6–5.1)
Alkaline phosphatase (APISO): 66 U/L (ref 37–153)
BUN/Creatinine Ratio: 19 (calc) (ref 6–22)
BUN: 22 mg/dL (ref 7–25)
CO2: 23 mmol/L (ref 20–32)
Calcium: 10.1 mg/dL (ref 8.6–10.4)
Chloride: 103 mmol/L (ref 98–110)
Creat: 1.13 mg/dL — ABNORMAL HIGH (ref 0.50–1.03)
Globulin: 3 g/dL (calc) (ref 1.9–3.7)
Glucose, Bld: 178 mg/dL — ABNORMAL HIGH (ref 65–99)
Potassium: 4.6 mmol/L (ref 3.5–5.3)
Sodium: 137 mmol/L (ref 135–146)
Total Bilirubin: 0.4 mg/dL (ref 0.2–1.2)
Total Protein: 7.3 g/dL (ref 6.1–8.1)

## 2021-08-08 LAB — HEMOGLOBIN A1C
Hgb A1c MFr Bld: 6.7 % of total Hgb — ABNORMAL HIGH (ref <5.7)
Mean Plasma Glucose: 146 mg/dL
eAG (mmol/L): 8.1 mmol/L

## 2021-08-08 LAB — MICROALBUMIN / CREATININE URINE RATIO
Creatinine, Urine: 286 mg/dL — ABNORMAL HIGH (ref 20–275)
Microalb Creat Ratio: 10 mcg/mg creat (ref <30)
Microalb, Ur: 2.9 mg/dL

## 2021-08-09 NOTE — Assessment & Plan Note (Signed)
Unclear if any of her aches are related to statin use.  Discussed I asked her to hold rosuvastatin for 1-2 weeks and see if the cramps/aches get better.  It not, then restart the med.

## 2021-08-09 NOTE — Assessment & Plan Note (Signed)
Tetanus 2020 Flu 2022 Shingles d/w pt.   covid vaccine 2022 PNA d/w pt.   DXA not due  Pap 2021 Mammogram 2021 Colonoscopy 2016.  [name] designated if patient were incapacitated.   Diet and exercise d/w pt.  limited by knee pain.

## 2021-08-09 NOTE — Assessment & Plan Note (Signed)
Continue Synthroid.  See notes on labs.

## 2021-08-09 NOTE — Assessment & Plan Note (Signed)
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  No CP.  Functional status limited by knee pain, but not known CV issue.  No syncope.  No h/o MI/CVA.  She does not have an obvious contraindication to surgery but it makes sense to check her preop/routine labs.  She does have a history of DVT after surgery previously and is likely going to need at least temporary anticoagulation for DVT prophylaxis after surgery.  I will defer to orthopedics about that.

## 2021-08-09 NOTE — Assessment & Plan Note (Signed)
Continue temazepam.  No adverse effect on medication.  Continue as is, when needed.

## 2021-08-09 NOTE — Assessment & Plan Note (Signed)
Exercise limited by knee pain.  She is working on diet.  Continue metformin.  See notes on labs.

## 2021-08-11 ENCOUNTER — Other Ambulatory Visit: Payer: Self-pay | Admitting: Family Medicine

## 2021-08-11 DIAGNOSIS — E039 Hypothyroidism, unspecified: Secondary | ICD-10-CM

## 2021-08-11 DIAGNOSIS — D696 Thrombocytopenia, unspecified: Secondary | ICD-10-CM

## 2021-08-11 DIAGNOSIS — E119 Type 2 diabetes mellitus without complications: Secondary | ICD-10-CM

## 2021-08-11 MED ORDER — LEVOTHYROXINE SODIUM 112 MCG PO TABS: 112.0000 ug | ORAL_TABLET | Freq: Every day | ORAL | 3 refills | Status: DC

## 2021-08-24 ENCOUNTER — Encounter: Payer: Self-pay | Admitting: *Deleted

## 2021-08-24 ENCOUNTER — Encounter: Payer: BC Managed Care – PPO | Admitting: Family Medicine

## 2021-08-28 ENCOUNTER — Encounter: Payer: Self-pay | Admitting: Oncology

## 2021-08-28 ENCOUNTER — Inpatient Hospital Stay: Payer: BC Managed Care – PPO | Attending: Oncology | Admitting: Oncology

## 2021-08-28 ENCOUNTER — Other Ambulatory Visit: Payer: Self-pay

## 2021-08-28 ENCOUNTER — Inpatient Hospital Stay: Payer: BC Managed Care – PPO | Admitting: Oncology

## 2021-08-28 VITALS — BP 147/87 | HR 83 | Temp 97.1°F | Resp 18 | Wt 199.2 lb

## 2021-08-28 DIAGNOSIS — D696 Thrombocytopenia, unspecified: Secondary | ICD-10-CM

## 2021-08-28 DIAGNOSIS — E119 Type 2 diabetes mellitus without complications: Secondary | ICD-10-CM | POA: Insufficient documentation

## 2021-08-28 DIAGNOSIS — Z87891 Personal history of nicotine dependence: Secondary | ICD-10-CM | POA: Diagnosis not present

## 2021-08-28 DIAGNOSIS — E538 Deficiency of other specified B group vitamins: Secondary | ICD-10-CM

## 2021-08-28 DIAGNOSIS — M2550 Pain in unspecified joint: Secondary | ICD-10-CM | POA: Insufficient documentation

## 2021-08-28 DIAGNOSIS — I10 Essential (primary) hypertension: Secondary | ICD-10-CM | POA: Diagnosis not present

## 2021-08-28 LAB — CBC WITH DIFFERENTIAL/PLATELET
Abs Immature Granulocytes: 0.02 10*3/uL (ref 0.00–0.07)
Basophils Absolute: 0 10*3/uL (ref 0.0–0.1)
Basophils Relative: 1 %
Eosinophils Absolute: 0.2 10*3/uL (ref 0.0–0.5)
Eosinophils Relative: 2 %
HCT: 41.9 % (ref 36.0–46.0)
Hemoglobin: 13.3 g/dL (ref 12.0–15.0)
Immature Granulocytes: 0 %
Lymphocytes Relative: 17 %
Lymphs Abs: 1.2 10*3/uL (ref 0.7–4.0)
MCH: 28.9 pg (ref 26.0–34.0)
MCHC: 31.7 g/dL (ref 30.0–36.0)
MCV: 90.9 fL (ref 80.0–100.0)
Monocytes Absolute: 0.4 10*3/uL (ref 0.1–1.0)
Monocytes Relative: 5 %
Neutro Abs: 5.3 10*3/uL (ref 1.7–7.7)
Neutrophils Relative %: 75 %
Platelets: 230 10*3/uL (ref 150–400)
RBC: 4.61 MIL/uL (ref 3.87–5.11)
RDW: 14.6 % (ref 11.5–15.5)
WBC: 7.1 10*3/uL (ref 4.0–10.5)
nRBC: 0 % (ref 0.0–0.2)

## 2021-08-28 LAB — FOLATE: Folate: 6 ng/mL (ref >5.9)

## 2021-08-28 LAB — IMMATURE PLATELET FRACTION: Immature Platelet Fraction: 3.7 % (ref 1.2–8.6)

## 2021-08-28 LAB — TECHNOLOGIST SMEAR REVIEW: Plt Morphology: ADEQUATE

## 2021-08-28 LAB — HEPATITIS B SURFACE ANTIGEN: Hepatitis B Surface Ag: NONREACTIVE

## 2021-08-28 LAB — VITAMIN B12: Vitamin B-12: 137 pg/mL — ABNORMAL LOW (ref 180–914)

## 2021-08-28 LAB — HEPATITIS B CORE ANTIBODY, IGM: Hep B C IgM: NONREACTIVE

## 2021-08-28 NOTE — Progress Notes (Signed)
Hematology/Oncology Consult note [phone] [phone]      Patient Care Team: Tonia Ghent, MD as PCP - General (Family Medicine) Tonia Ghent, MD as Consulting Physician (Family Medicine) Irine Seal, MD as Attending Physician (Urology) [place], [place], [place] (Optometry) Earlie Server, MD as Consulting Physician (Hematology and Oncology)  REFERRING PROVIDER: Tonia Ghent, MD  CHIEF COMPLAINTS/REASON FOR VISIT:  Evaluation of thrombocytopenia  HISTORY OF PRESENTING ILLNESS:   Christine Norman is a  61 y.o.  female with PMH listed below was seen in consultation at the request of  Tonia Ghent, MD  for evaluation of thrombocytopenia  08/07/2021, patient had a blood work done which showed thrombocytopenia with platelet count 72,000. Patient denies any bleeding or easy bruising.  She recalls that she has had low blood counts last year and she was told that was due to platelet clumping.  Denies any unintentional weight loss, fever, night sweats.  She feels at baseline.  She only drinks alcohol socially. + Joint pain, her mother has a history of rheumatoid arthritis.  She denies any morning stiffness.  Review of Systems  Constitutional:  Negative for appetite change, chills, fatigue and fever.  HENT:   Negative for hearing loss and voice change.   Eyes:  Negative for eye problems.  Respiratory:  Negative for chest tightness and cough.   Cardiovascular:  Negative for chest pain.  Gastrointestinal:  Negative for abdominal distention, abdominal pain and blood in stool.  Endocrine: Negative for hot flashes.  Genitourinary:  Negative for difficulty urinating and frequency.   Musculoskeletal:  Positive for arthralgias.  Skin:  Negative for itching and rash.  Neurological:  Negative for extremity weakness.  Hematological:  Negative for adenopathy.  Psychiatric/Behavioral:  Negative for confusion.    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    Atypical mole 07/09/2012   Right Scalpula (excised)   Back pain    Constipation    chronic- takes OTC med that helps at times and others not    Depression    Diabetes mellitus without complication ([place])    DVT (deep venous thrombosis), right 2000   rt knee-ankle   GERD (gastroesophageal reflux disease)    IBS   History of COVID-19    Hyperlipidemia    Hypertension    not medicated   Insomnia    Thyroid disease    hypothyroidism    SURGICAL HISTORY: Past Surgical History:  Procedure Laterality Date   BREAST BIOPSY  01/29/2012   Procedure: BREAST BIOPSY WITH NEEDLE LOCALIZATION;  Surgeon: Haywood Lasso, MD;  Location: [place];  Service: General;  Laterality: Left;   CHOLECYSTECTOMY  2001   lap choli   COLONOSCOPY  2006   normal   FRACTURE SURGERY  2001   orif rt fif with knee surg   HAMMER TOE SURGERY  1997   rt and lt   HERNIA REPAIR  03   rt ing hernia   KNEE ARTHROSCOPY  2000   rt   TEAR DUCT PROBING Bilateral 2017   and plug placement to treat dry eye    SOCIAL HISTORY: Social History   Socioeconomic History   Marital status: Married  Spouse name: Not on file   Number of children: Not on file   Years of education: Not on file   Highest education level: Not on file  Occupational History   Not on file  Tobacco Use   Smoking status: Former    Packs/day: 0.50    Years: 20.00    Pack years: 10.00    Types: Cigarettes    Quit date: 03/21/2011    Years since quitting: 10.4   Smokeless tobacco: Never  Vaping Use   Vaping Use: Former   Devices: vaped for about 1 year  Substance and Sexual Activity   Alcohol use: Yes    Alcohol/week: 0.0 standard drinks    Comment: one a  month-socially   Drug use: No   Sexual activity: Never  Other Topics Concern   Not on file  Social History Narrative   No kids   [employer] fan   Retired from [employer] 2019   Married 2017   Social Determinants of Radio broadcast assistant Strain: Not on file  Food Insecurity: Not on file  Transportation Needs: Not on file  Physical Activity: Not on file  Stress: Not on file  Social Connections: Not on file  Intimate Partner Violence: Not on file    FAMILY HISTORY: Family History  Problem Relation Age of Onset   Atrial fibrillation Mother    Arthritis Mother    Diabetes Mother    Heart failure Father    Atrial fibrillation Father    Colon polyps Father    Heart disease Maternal Grandmother    Heart disease Maternal Grandfather    Colon cancer Neg Hx    Rectal cancer Neg Hx    Stomach cancer Neg Hx    Breast cancer Neg Hx     ALLERGIES:  is allergic to celebrex [celecoxib] and etodolac.  MEDICATIONS:  Current Outpatient Medications  Medication Sig Dispense Refill   Accu-Chek FastClix Lancets MISC Use to test blood sugar daily. DX: E11.9 102 each 3   Blood Glucose Monitoring Suppl (ACCU-CHEK GUIDE) w/Device KIT 1 kit by Subdermal route daily. Use to test blood sugar daily. DX: E11.9 1 kit 0   Cholecalciferol (VITAMIN D3) 25 MCG (1000 UT) CAPS Take 1 capsule (1,000 Units total) by mouth daily.     clobetasol (OLUX) 0.05 % topical foam Apply topically 2 (two) times daily. 50 g 6   Clobetasol Propionate 0.05 % shampoo Apply 1 application topically daily. 118 mL 2   cyclobenzaprine (FLEXERIL) 10 MG tablet Take 0.5-1 tablets by mouth at bedtime.     diclofenac (VOLTAREN) 50 MG EC tablet      docusate sodium (COLACE) 100 MG capsule Take 300 mg by mouth at bedtime.     glucose blood (ACCU-CHEK GUIDE) test strip Use as instructed to test blood sugar once daily and as needed.  Diagnosis:  E11.9  Non insulin dependent. 100 each 3   levothyroxine (SYNTHROID) 112 MCG tablet Take 1 tablet  (112 mcg total) by mouth daily. 90 tablet 3   metFORMIN (GLUCOPHAGE-XR) 500 MG 24 hr tablet Take 1 tablet (500 mg total) by mouth daily with breakfast. 90 tablet 3   RESTASIS 0.05 % ophthalmic emulsion      rosuvastatin (CRESTOR) 10 MG tablet Take 1 tablet (10 mg total) by mouth daily. 90 tablet 3   temazepam (RESTORIL) 15 MG capsule TAKE 1 TO 2 CAPSULES AT    BEDTIME AS NEEDED FOR SLEEP 180 capsule 1   esomeprazole (NEXIUM)  20 MG capsule Take 1 capsule (20 mg total) by mouth daily as needed. (Patient not taking: Reported on 08/28/2021)     gabapentin (NEURONTIN) 300 MG capsule Take 300 mg by mouth as needed.     PENNSAID 2 % SOLN SMARTSIG:1 Pump Topical 3 Times Daily PRN (Patient not taking: Reported on 08/28/2021)     No current facility-administered medications for this visit.     PHYSICAL EXAMINATION: ECOG PERFORMANCE STATUS: 0 - Asymptomatic Vitals:   08/28/21 1050  BP: (!) 147/87  Pulse: 83  Resp: 18  Temp: (!) 97.1 F (36.2 C)   Filed Weights   08/28/21 1050  Weight: 199 lb 3.2 oz (90.4 kg)    Physical Exam Constitutional:      General: She is not in acute distress. HENT:     Head: Normocephalic and atraumatic.  Eyes:     General: No scleral icterus. Cardiovascular:     Rate and Rhythm: Normal rate and regular rhythm.     Heart sounds: Normal heart sounds.  Pulmonary:     Effort: Pulmonary effort is normal. No respiratory distress.     Breath sounds: No wheezing.  Abdominal:     General: Bowel sounds are normal. There is no distension.     Palpations: Abdomen is soft.  Musculoskeletal:        General: No deformity. Normal range of motion.     Cervical back: Normal range of motion and neck supple.  Skin:    General: Skin is warm and dry.     Findings: No erythema or rash.  Neurological:     Mental Status: She is alert and oriented to person, place, and time. Mental status is at baseline.     Cranial Nerves: No cranial nerve deficit.     Coordination:  Coordination normal.  Psychiatric:        Mood and Affect: Mood normal.    LABORATORY DATA:  I have reviewed the data as listed Lab Results  Component Value Date   WBC 7.1 08/28/2021   HGB 13.3 08/28/2021   HCT 41.9 08/28/2021   MCV 90.9 08/28/2021   PLT 230 08/28/2021   Recent Labs    08/07/21 1130  NA 137  K 4.6  CL 103  CO2 23  GLUCOSE 178*  BUN 22  CREATININE 1.13*  CALCIUM 10.1  PROT 7.3  AST 12  ALT 17  BILITOT 0.4   Iron/TIBC/Ferritin/ %Sat No results found for: IRON, TIBC, FERRITIN, IRONPCTSAT    RADIOGRAPHIC STUDIES: I have personally reviewed the radiological images as listed and agreed with the findings in the report. No results found.    ASSESSMENT & PLAN:  1. Thrombocytopenia ([place])   2. Vitamin B12 deficiency    #Thrombocytopenia, I will check CBC, smear, immature platelet fraction, flow cytometry, SPEP, light chain ratio, hepatitis B [previously hepatitis C was checked], HIV, folate and vitamin B12. Most likely reactive. Check ANA given her history of arthralgia and family history of rheumatoid arthritis.  #Vitamin B12 deficiency.  B12 level is low at 137. Recommend patient to start oral B12 supplementation 1000 MCG daily-OTC supply. Orders Placed This Encounter  Procedures   Vitamin B12    Standing Status:   Future    Number of Occurrences:   1    Standing Expiration Date:   08/28/2022   Folate    Standing Status:   Future    Number of Occurrences:   1    Standing Expiration Date:  08/28/2022   Technologist smear review    Standing Status:   Future    Number of Occurrences:   1    Standing Expiration Date:   08/28/2022   CBC with Differential/Platelet    Standing Status:   Future    Number of Occurrences:   1    Standing Expiration Date:   08/28/2022   Immature Platelet Fraction    Standing Status:   Future    Number of Occurrences:   1    Standing Expiration Date:   08/28/2022   Flow cytometry panel-leukemia/lymphoma work-up     Standing Status:   Future    Number of Occurrences:   1    Standing Expiration Date:   08/28/2022   Kappa/lambda light chains    Standing Status:   Future    Number of Occurrences:   1    Standing Expiration Date:   08/28/2022   Multiple Myeloma Panel (SPEP&IFE w/QIG)    Standing Status:   Future    Number of Occurrences:   1    Standing Expiration Date:   08/28/2022   ANA, IFA (with reflex)    Standing Status:   Future    Number of Occurrences:   1    Standing Expiration Date:   08/28/2022   Hepatitis B core antibody, IgM    Standing Status:   Future    Number of Occurrences:   1    Standing Expiration Date:   08/28/2022   Hepatitis B surface antigen    Standing Status:   Future    Number of Occurrences:   1    Standing Expiration Date:   08/28/2022    All questions were answered. The patient knows to call the clinic with any problems questions or concerns.   Tonia Ghent, MD    Return of visit: 3 weeks to review results. Thank you for this kind referral and the opportunity to participate in the care of this patient. A copy of today's note is routed to referring provider   Earlie Server, MD, PhD [facility] Hematology Oncology 08/28/2021

## 2021-08-28 NOTE — Progress Notes (Signed)
Pt here to establish care for thrombocytopenia.

## 2021-08-29 ENCOUNTER — Telehealth: Payer: Self-pay

## 2021-08-29 LAB — KAPPA/LAMBDA LIGHT CHAINS
Kappa free light chain: 33.8 mg/L — ABNORMAL HIGH (ref 3.3–19.4)
Kappa, lambda light chain ratio: 1.56 (ref 0.26–1.65)
Lambda free light chains: 21.6 mg/L (ref 5.7–26.3)

## 2021-08-29 LAB — ANTINUCLEAR ANTIBODIES, IFA: ANA Ab, IFA: NEGATIVE

## 2021-08-29 NOTE — Telephone Encounter (Signed)
-----   Message from Earlie Server, MD sent at 08/28/2021  5:03 PM EST ----- Let her know that her vitamin B12 levels are low.  Recommend patient to start oral vitamin B12 1000 MCG daily.  She can buy over-the-counter.  Other labs are pending.  Same follow-up plan.

## 2021-08-29 NOTE — Progress Notes (Signed)
Phone created

## 2021-08-29 NOTE — Telephone Encounter (Signed)
Spoke with patient and informed of B12 results and Dr. Collie Siad recommendations. Patient verbalized understand.

## 2021-08-30 LAB — COMP PANEL: LEUKEMIA/LYMPHOMA

## 2021-09-02 LAB — MULTIPLE MYELOMA PANEL, SERUM
Albumin SerPl Elph-Mcnc: 3.7 g/dL (ref 2.9–4.4)
Albumin/Glob SerPl: 1.1 (ref 0.7–1.7)
Alpha 1: 0.3 g/dL (ref 0.0–0.4)
Alpha2 Glob SerPl Elph-Mcnc: 0.8 g/dL (ref 0.4–1.0)
B-Globulin SerPl Elph-Mcnc: 0.9 g/dL (ref 0.7–1.3)
Gamma Glob SerPl Elph-Mcnc: 1.5 g/dL (ref 0.4–1.8)
Globulin, Total: 3.4 g/dL (ref 2.2–3.9)
IgA: <5 mg/dL — ABNORMAL LOW (ref 87–352)
IgG (Immunoglobin G), Serum: 1308 mg/dL (ref 586–1602)
IgM (Immunoglobulin M), Srm: 102 mg/dL (ref 26–217)
Total Protein ELP: 7.1 g/dL (ref 6.0–8.5)

## 2021-09-20 ENCOUNTER — Other Ambulatory Visit: Payer: Self-pay

## 2021-09-20 ENCOUNTER — Encounter: Payer: Self-pay | Admitting: Oncology

## 2021-09-20 ENCOUNTER — Inpatient Hospital Stay: Payer: BC Managed Care – PPO

## 2021-09-20 ENCOUNTER — Inpatient Hospital Stay: Payer: BC Managed Care – PPO | Attending: Oncology | Admitting: Oncology

## 2021-09-20 VITALS — BP 133/83 | HR 80 | Temp 96.7°F | Wt 200.7 lb

## 2021-09-20 DIAGNOSIS — Z87891 Personal history of nicotine dependence: Secondary | ICD-10-CM | POA: Insufficient documentation

## 2021-09-20 DIAGNOSIS — D809 Immunodeficiency with predominantly antibody defects, unspecified: Secondary | ICD-10-CM

## 2021-09-20 DIAGNOSIS — M2550 Pain in unspecified joint: Secondary | ICD-10-CM | POA: Insufficient documentation

## 2021-09-20 DIAGNOSIS — E538 Deficiency of other specified B group vitamins: Secondary | ICD-10-CM | POA: Diagnosis not present

## 2021-09-20 DIAGNOSIS — D802 Selective deficiency of immunoglobulin A [IgA]: Secondary | ICD-10-CM | POA: Insufficient documentation

## 2021-09-20 DIAGNOSIS — E119 Type 2 diabetes mellitus without complications: Secondary | ICD-10-CM | POA: Diagnosis not present

## 2021-09-20 DIAGNOSIS — I10 Essential (primary) hypertension: Secondary | ICD-10-CM | POA: Insufficient documentation

## 2021-09-20 DIAGNOSIS — D696 Thrombocytopenia, unspecified: Secondary | ICD-10-CM

## 2021-09-20 NOTE — Progress Notes (Signed)
Hematology/Oncology Progress note [phone] [phone]      Patient Care Team: Tonia Ghent, MD as PCP - General (Family Medicine) Tonia Ghent, MD as Consulting Physician (Family Medicine) Irine Seal, MD as Attending Physician (Urology) [place], [place], [employer] (Optometry) Earlie Server, MD as Consulting Physician (Hematology and Oncology)  REFERRING PROVIDER: Tonia Ghent, MD  CHIEF COMPLAINTS/REASON FOR VISIT:  thrombocytopenia  HISTORY OF PRESENTING ILLNESS:   Christine Norman is a  61 y.o.  female with PMH listed below was seen in consultation at the request of  Tonia Ghent, MD  for evaluation of thrombocytopenia  08/07/2021, patient had a blood work done which showed thrombocytopenia with platelet count 72,000. Patient denies any bleeding or easy bruising.  She recalls that she has had low blood counts last year and she was told that was due to platelet clumping.  Denies any unintentional weight loss, fever, night sweats.  She feels at baseline.  She only drinks alcohol socially. + Joint pain, her mother has a history of rheumatoid arthritis.  She denies any morning stiffness.  INTERVAL HISTORY Christine Norman is a 61 y.o. female who has above history reviewed by me today presents for follow up visit for thrombocytopenia.   Patient had a blood work done since last visit and presents to discuss results.  She has no new complaints.   Review of Systems  Constitutional:  Negative for appetite change, chills, fatigue and fever.  HENT:   Negative for hearing loss and voice change.   Eyes:  Negative for eye problems.  Respiratory:  Negative for chest tightness and cough.   Cardiovascular:  Negative for chest pain.  Gastrointestinal:  Negative for abdominal distention, abdominal pain and blood in stool.  Endocrine: Negative for hot flashes.  Genitourinary:  Negative for difficulty urinating and frequency.   Musculoskeletal:  Positive for arthralgias.  Skin:   Negative for itching and rash.  Neurological:  Negative for extremity weakness.  Hematological:  Negative for adenopathy.  Psychiatric/Behavioral:  Negative for confusion.    MEDICAL HISTORY:  Past Medical History:  Diagnosis Date   Allergy    seasonal   Arthritis    ankles and back, s/p epidural injections   Atypical ductal hyperplasia of breast 01/18/2012   Surgical Biopsy on 01/29/2012 showed a complex sclerosing lesion including sclerosing adenosis, stromal fibrosis, and cysts.    Atypical mole 07/09/2012   Right Scalpula (excised)   Back pain    Constipation    chronic- takes OTC med that helps at times and others not    Depression    Diabetes mellitus without complication ([place])    DVT (deep venous thrombosis), right 2000   rt knee-ankle   GERD (gastroesophageal reflux disease)    IBS   History of COVID-19    Hyperlipidemia    Hypertension    not medicated   Insomnia    Thyroid disease    hypothyroidism    SURGICAL HISTORY: Past Surgical History:  Procedure Laterality Date   BREAST BIOPSY  01/29/2012   Procedure: BREAST BIOPSY WITH NEEDLE LOCALIZATION;  Surgeon: Haywood Lasso, MD;  Location: [facility];  Service: General;  Laterality: Left;   CHOLECYSTECTOMY  2001   lap choli   COLONOSCOPY  2006   normal   FRACTURE SURGERY  2001   orif rt fif with knee surg   HAMMER TOE SURGERY  1997   rt and lt   HERNIA REPAIR  03   rt  ing hernia   KNEE ARTHROSCOPY  2000   rt   TEAR DUCT PROBING Bilateral 2017   and plug placement to treat dry eye    SOCIAL HISTORY: Social History   Socioeconomic History   Marital status: Married    Spouse name: Not on file   Number of children: Not on file   Years of education: Not on file   Highest education level: Not on file  Occupational History   Not on file  Tobacco Use   Smoking status: Former    Packs/day: 0.50    Years: 20.00    Pack years: 10.00    Types: Cigarettes    Quit date: 03/21/2011     Years since quitting: 10.5   Smokeless tobacco: Never  Vaping Use   Vaping Use: Former   Devices: vaped for about 1 year  Substance and Sexual Activity   Alcohol use: Yes    Alcohol/week: 0.0 standard drinks    Comment: one a month-socially   Drug use: No   Sexual activity: Never  Other Topics Concern   Not on file  Social History Narrative   No kids   [employer] fan   Retired from [employer] 2019   Married 2017   Social Determinants of Radio broadcast assistant Strain: Not on file  Food Insecurity: Not on file  Transportation Needs: Not on file  Physical Activity: Not on file  Stress: Not on file  Social Connections: Not on file  Intimate Partner Violence: Not on file    FAMILY HISTORY: Family History  Problem Relation Age of Onset   Atrial fibrillation Mother    Arthritis Mother    Diabetes Mother    Heart failure Father    Atrial fibrillation Father    Colon polyps Father    Heart disease Maternal Grandmother    Heart disease Maternal Grandfather    Colon cancer Neg Hx    Rectal cancer Neg Hx    Stomach cancer Neg Hx    Breast cancer Neg Hx     ALLERGIES:  is allergic to celebrex [celecoxib] and etodolac.  MEDICATIONS:  Current Outpatient Medications  Medication Sig Dispense Refill   Accu-Chek FastClix Lancets MISC Use to test blood sugar daily. DX: E11.9 102 each 3   Blood Glucose Monitoring Suppl (ACCU-CHEK GUIDE) w/Device KIT 1 kit by Subdermal route daily. Use to test blood sugar daily. DX: E11.9 1 kit 0   Cholecalciferol (VITAMIN D3) 25 MCG (1000 UT) CAPS Take 1 capsule (1,000 Units total) by mouth daily.     clobetasol (OLUX) 0.05 % topical foam Apply topically 2 (two) times daily. 50 g 6   Clobetasol Propionate 0.05 % shampoo Apply 1 application topically daily. 118 mL 2   cyclobenzaprine (FLEXERIL) 10 MG tablet Take 0.5-1 tablets by mouth at bedtime.     diclofenac (VOLTAREN) 50 MG EC tablet      docusate sodium (COLACE) 100 MG capsule Take 300 mg by mouth at  bedtime.     gabapentin (NEURONTIN) 300 MG capsule Take 300 mg by mouth as needed.     glucose blood (ACCU-CHEK GUIDE) test strip Use as instructed to test blood sugar once daily and as needed.  Diagnosis:  E11.9  Non insulin dependent. 100 each 3   levothyroxine (SYNTHROID) 112 MCG tablet Take 1 tablet (112 mcg total) by mouth daily. 90 tablet 3   metFORMIN (GLUCOPHAGE-XR) 500 MG 24 hr tablet Take 1 tablet (500 mg total) by mouth daily  with breakfast. 90 tablet 3   RESTASIS 0.05 % ophthalmic emulsion      rosuvastatin (CRESTOR) 10 MG tablet Take 1 tablet (10 mg total) by mouth daily. 90 tablet 3   temazepam (RESTORIL) 15 MG capsule TAKE 1 TO 2 CAPSULES AT    BEDTIME AS NEEDED FOR SLEEP 180 capsule 1   esomeprazole (NEXIUM) 20 MG capsule Take 1 capsule (20 mg total) by mouth daily as needed. (Patient not taking: Reported on 08/28/2021)     PENNSAID 2 % SOLN SMARTSIG:1 Pump Topical 3 Times Daily PRN (Patient not taking: Reported on 08/28/2021)     No current facility-administered medications for this visit.     PHYSICAL EXAMINATION: ECOG PERFORMANCE STATUS: 0 - Asymptomatic Vitals:   09/20/21 1001  BP: 133/83  Pulse: 80  Temp: (!) 96.7 F (35.9 C)   Filed Weights   09/20/21 1001  Weight: 200 lb 11.2 oz (91 kg)    Physical Exam Constitutional:      General: She is not in acute distress. HENT:     Head: Normocephalic and atraumatic.  Eyes:     General: No scleral icterus. Cardiovascular:     Rate and Rhythm: Normal rate and regular rhythm.     Heart sounds: Normal heart sounds.  Pulmonary:     Effort: Pulmonary effort is normal. No respiratory distress.     Breath sounds: No wheezing.  Abdominal:     General: Bowel sounds are normal. There is no distension.     Palpations: Abdomen is soft.  Musculoskeletal:        General: No deformity. Normal range of motion.     Cervical back: Normal range of motion and neck supple.  Skin:    General: Skin is warm and dry.     Findings:  No erythema or rash.  Neurological:     Mental Status: She is alert and oriented to person, place, and time. Mental status is at baseline.     Cranial Nerves: No cranial nerve deficit.     Coordination: Coordination normal.  Psychiatric:        Mood and Affect: Mood normal.    LABORATORY DATA:  I have reviewed the data as listed Lab Results  Component Value Date   WBC 7.1 08/28/2021   HGB 13.3 08/28/2021   HCT 41.9 08/28/2021   MCV 90.9 08/28/2021   PLT 230 08/28/2021   Recent Labs    08/07/21 1130  NA 137  K 4.6  CL 103  CO2 23  GLUCOSE 178*  BUN 22  CREATININE 1.13*  CALCIUM 10.1  PROT 7.3  AST 12  ALT 17  BILITOT 0.4    Iron/TIBC/Ferritin/ %Sat No results found for: IRON, TIBC, FERRITIN, IRONPCTSAT    RADIOGRAPHIC STUDIES: I have personally reviewed the radiological images as listed and agreed with the findings in the report. No results found.    ASSESSMENT & PLAN:  1. Thrombocytopenia ([place])   2. 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  Negative flow cytometry, no M protein on SPEP, normal light chain ratio.  Negative ANA.  Negative hepatitis B surface antigen, negative hep B core IgM antibody.  #Decreased IgA level.  Incidental finding on the multiple myeloma panel.  Patient appears to be asymptomatic.  No history of frequent infections.  I will repeat immunoglobulin.  If repeat testing also shows severe decreased IgA level, patient may have IgA deficiency and  I will recommend patient to establish care with immunology for official work-up.   we discussed about possible anaphylactic reaction with blood product transfusion.    #Vitamin B12 deficiency.  B12 level is low at 137. Recommend patient to continue oral B12 supplementation 1000 MCG daily-OTC supply. Repeat antiparietal antibody and intrinsic factor antibody.  Orders Placed This Encounter  Procedures   Intrinsic  Factor Antibodies    Standing Status:   Future    Number of Occurrences:   1    Standing Expiration Date:   09/20/2022   Anti-parietal antibody    Standing Status:   Future    Number of Occurrences:   1    Standing Expiration Date:   09/20/2022   Immunoglobulins, QN, A/E/G/M    Standing Status:   Future    Number of Occurrences:   1    Standing Expiration Date:   09/20/2022    All questions were answered. The patient knows to call the clinic with any problems questions or concerns.   Tonia Ghent, MD    Return of visit:  We discussed about option of continued follow-up with [place] versus patient to follow-up with primary care provider.  We will contact patient after her current blood work comes back.   Thank you for this kind referral and the opportunity to participate in the care of this patient. A copy of today's note is routed to referring provider   Earlie Server, MD, PhD [facility] Hematology Oncology 09/20/2021

## 2021-09-21 LAB — INTRINSIC FACTOR ANTIBODIES: Intrinsic Factor: 1.1 AU/mL (ref 0.0–1.1)

## 2021-09-21 LAB — ANTI-PARIETAL ANTIBODY: Parietal Cell Antibody-IgG: 2 Units (ref 0.0–20.0)

## 2021-09-24 LAB — IMMUNOGLOBULINS A/E/G/M, SERUM
IgA: <5 mg/dL — ABNORMAL LOW (ref 87–352)
IgE (Immunoglobulin E), Serum: 27 IU/mL (ref 6–495)
IgG (Immunoglobin G), Serum: 1281 mg/dL (ref 586–1602)
IgM (Immunoglobulin M), Srm: 91 mg/dL (ref 26–217)

## 2021-09-26 ENCOUNTER — Telehealth: Payer: Self-pay

## 2021-09-26 ENCOUNTER — Other Ambulatory Visit: Payer: Self-pay

## 2021-09-26 DIAGNOSIS — E538 Deficiency of other specified B group vitamins: Secondary | ICD-10-CM

## 2021-09-26 DIAGNOSIS — D696 Thrombocytopenia, unspecified: Secondary | ICD-10-CM

## 2021-09-26 NOTE — Telephone Encounter (Signed)
Left pt a VM with details regarding lab appts. Asked her to call back if she needed to reschedule.

## 2021-09-26 NOTE — Telephone Encounter (Signed)
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 There is no treatment for that.  Refer her to see immunology. - [facility]

## 2021-09-26 NOTE — Telephone Encounter (Signed)
Please contact pt to set lab appt (B12).

## 2021-09-26 NOTE — Telephone Encounter (Signed)
Patient informed of results and referral.   Patient would like to come in for B12 level recheck. She said she discussed this with you and you told her if B12 continued to be low she would probably need B12 injections.

## 2021-09-27 ENCOUNTER — Other Ambulatory Visit: Payer: Self-pay

## 2021-09-27 DIAGNOSIS — D802 Selective deficiency of immunoglobulin A [IgA]: Secondary | ICD-10-CM

## 2021-09-28 ENCOUNTER — Inpatient Hospital Stay: Payer: BC Managed Care – PPO

## 2021-09-29 NOTE — Telephone Encounter (Signed)
Patient has appt to [facility] center on 3/29.

## 2021-10-03 NOTE — Telephone Encounter (Signed)
Will you schedule her for labs tomorrow.

## 2021-10-05 ENCOUNTER — Other Ambulatory Visit: Payer: Self-pay

## 2021-10-05 ENCOUNTER — Inpatient Hospital Stay: Payer: BC Managed Care – PPO

## 2021-10-05 DIAGNOSIS — E538 Deficiency of other specified B group vitamins: Secondary | ICD-10-CM

## 2021-10-05 DIAGNOSIS — D696 Thrombocytopenia, unspecified: Secondary | ICD-10-CM | POA: Diagnosis not present

## 2021-10-05 LAB — VITAMIN B12: Vitamin B-12: 785 pg/mL (ref 180–914)

## 2021-10-11 ENCOUNTER — Other Ambulatory Visit: Payer: Self-pay | Admitting: Family Medicine

## 2021-10-11 DIAGNOSIS — E119 Type 2 diabetes mellitus without complications: Secondary | ICD-10-CM

## 2021-11-06 ENCOUNTER — Other Ambulatory Visit: Payer: Self-pay

## 2021-11-06 ENCOUNTER — Ambulatory Visit (INDEPENDENT_AMBULATORY_CARE_PROVIDER_SITE_OTHER): Payer: BC Managed Care – PPO | Admitting: Family Medicine

## 2021-11-06 ENCOUNTER — Encounter: Payer: Self-pay | Admitting: Family Medicine

## 2021-11-06 VITALS — BP 124/80 | HR 76 | Temp 97.2°F | Ht 63.0 in | Wt 201.0 lb

## 2021-11-06 DIAGNOSIS — E119 Type 2 diabetes mellitus without complications: Secondary | ICD-10-CM | POA: Diagnosis not present

## 2021-11-06 DIAGNOSIS — E039 Hypothyroidism, unspecified: Secondary | ICD-10-CM

## 2021-11-06 DIAGNOSIS — D802 Selective deficiency of immunoglobulin A [IgA]: Secondary | ICD-10-CM

## 2021-11-06 DIAGNOSIS — E538 Deficiency of other specified B group vitamins: Secondary | ICD-10-CM | POA: Diagnosis not present

## 2021-11-06 MED ORDER — METFORMIN HCL ER 500 MG PO TB24: 1000.0000 mg | ORAL_TABLET | Freq: Every day | ORAL | 3 refills | Status: DC

## 2021-11-06 NOTE — Progress Notes (Signed)
This visit occurred during the SARS-CoV-2 public health emergency.  Safety protocols were in place, including screening questions prior to the visit, additional usage of staff PPE, and extensive cleaning of exam room while observing appropriate contact time as indicated for disinfecting solutions. ? ?Diabetes:  ?Using medications without difficulties: yes ?Hypoglycemic episodes:no ?Hyperglycemic episodes:no ?Feet problems: no changes.   ?Blood Sugars averaging: see below.   ?eye exam within last year: yes ?She has been taking metformin '1000mg'$  a day.  Now sugar has been in the 120-130s recently.  Improved with higher dose of metformin.   ? ?She is going to see Dr. Ninfa Linden about her knee and I'll await input from him.  ? ?H/o B12 def responded to replacement.  Discussed checking B12 later on. ? ?We talked about her previous hematology evaluation with IgA deficiency and immunology/allergy referral pending. ? ?She is taking levothyroxine 133mg daily, compliant.  Recheck pending.  Fatigue noted, not worse but still there.  No neck mass or lump noted by patient.  ? ?PMH and SH reviewed ? ?Meds, vitals, and allergies reviewed.  ? ?ROS: Per HPI unless specifically indicated in ROS section  ? ?GEN: nad, alert and oriented ?HEENT: ncat ?NECK: supple w/o LA ?CV: rrr. ?PULM: ctab, no inc wob ?ABD: soft, +bs ?EXT: no edema ?SKIN: well perfused.  ?

## 2021-11-06 NOTE — Patient Instructions (Addendum)
Go to the lab on the way out.   If you have mychart we'll likely use that to update you.   We'll make plans when I get your labs.   ?Take care.  Glad to see you. ?I'll await the notes from ortho and allergy/immunology.   ?

## 2021-11-07 LAB — HEMOGLOBIN A1C
Hgb A1c MFr Bld: 6.6 % of total Hgb — ABNORMAL HIGH (ref <5.7)
Mean Plasma Glucose: 143 mg/dL
eAG (mmol/L): 7.9 mmol/L

## 2021-11-07 LAB — TSH: TSH: 0.49 mIU/L (ref 0.40–4.50)

## 2021-11-08 DIAGNOSIS — D802 Selective deficiency of immunoglobulin A [IgA]: Secondary | ICD-10-CM | POA: Insufficient documentation

## 2021-11-08 DIAGNOSIS — E538 Deficiency of other specified B group vitamins: Secondary | ICD-10-CM | POA: Insufficient documentation

## 2021-11-08 NOTE — Assessment & Plan Note (Signed)
Still with fatigue noted.  Continue levothyroxine as is.  See notes on labs. ?

## 2021-11-08 NOTE — Assessment & Plan Note (Signed)
Will await immunology report.  I appreciate the help of all involved. ?

## 2021-11-08 NOTE — Assessment & Plan Note (Signed)
Continue oral replacement.  We can recheck her level later on. ?

## 2021-11-08 NOTE — Assessment & Plan Note (Signed)
Continue metformin 1000 mg a day.  See notes on labs. ?

## 2021-11-14 NOTE — Progress Notes (Signed)
? ?NEW PATIENT ?Date of Service/Encounter:  11/15/21 ?Referring provider: Earlie Server, MD ?Primary care provider: Tonia Ghent, MD ? ?Subjective:  ?Christine Norman is a 61 y.o. female with a PMHx of hyperlipidemia, hypothyroidism, hypertension, GERD, depression, nephrolithiasis, B12 deficiency presenting today for evaluation of IgA deficiency. ?History obtained from: chart review and patient. ?  ?Infection history: never been hospitalized, no recurrent infections, she does get steroid injections due to arthritis, never diagnosed with pneumonia  ?She used to have recurrent sinus infections yearly followed by Dr. Ernesto Rutherford. ?Never getting more than 2 or 3 courses of antibitiocs per year. Never required IV antibiotics. ?No recurrent abscesses or boils.   ?Did have a rectal abscess once years ago requiring I & D and gum abscess. ? ?Hypothyroidism and B 12 deficiency ?[name] has had mild psoriasis, not affecting her joints. Does get swelling and inflammation in her joint due to arthritis. ?She has had chronic constipation for years.  ?She has a history of kidney stones. ? ?She is up to date on all of her vaccines.  ? ?Her mother had severe RA, no immunodeficiencies, no childhood deaths or cancers. ? ?She has been evaluated by Oncology-last visit on 09/20/21-evaluated for thrombocytopenia.  Blood work for thrombocytopenia showed normal platelet counts, negative  flow cytometry, no M protein on SPEP, normal light chain ratio.  Negative ANA.  Negative hepatitis B surface antigen, negative hep B core IgM antibody. ? ?Her IgA level was noted low on her multiple myeloma panel.   ?On review of labs, she has undetectable IgA levels on 2 separate occasions (09/20/21) and (08/28/21) with normal IgG, IgM, IgE levels.  ? ?Seasonal allergies-takes claritin as needed, chlortabs as needed, hasn't taken anything in many years,  ? ?Other allergy screening: ?Asthma: no ?Food allergy: no ?Medication allergy:  celebrex-has had hives with angioedema  years ago; takes ibuprofen and other NSAIDs without issues ?Urticaria: no ?Eczema:no ?History of recurrent infections suggestive of immunodeficency: no ?Vaccinations are up to date.  ? ?Past Medical History: ?Past Medical History:  ?Diagnosis Date  ? Allergy   ? seasonal  ? Arthritis   ? ankles and back, s/p epidural injections  ? Atypical ductal hyperplasia of breast 01/18/2012  ? Surgical Biopsy on 01/29/2012 showed a complex sclerosing lesion including sclerosing adenosis, stromal fibrosis, and cysts.   ? Atypical mole 07/09/2012  ? Right Scalpula (excised)  ? Back pain   ? Constipation   ? chronic- takes OTC med that helps at times and others not   ? Depression   ? Diabetes mellitus without complication ([place])   ? DVT (deep venous thrombosis), right 2000  ? rt knee-ankle  ? GERD (gastroesophageal reflux disease)   ? IBS  ? History of COVID-19   ? Hyperlipidemia   ? Hypertension   ? not medicated  ? Insomnia   ? Thyroid disease   ? hypothyroidism  ? ?Medication List:  ?Current Outpatient Medications  ?Medication Sig Dispense Refill  ? Accu-Chek FastClix Lancets MISC Use to test blood sugar daily. DX: E11.9 102 each 3  ? Blood Glucose Monitoring Suppl (ACCU-CHEK GUIDE) w/Device KIT 1 kit by Subdermal route daily. Use to test blood sugar daily. DX: E11.9 1 kit 0  ? Cholecalciferol (VITAMIN D3) 25 MCG (1000 UT) CAPS Take 1 capsule (1,000 Units total) by mouth daily.    ? clobetasol (OLUX) 0.05 % topical foam Apply topically 2 (two) times daily. 50 g 6  ? Clobetasol Propionate 0.05 % shampoo Apply 1 application topically  daily. 118 mL 2  ? cyclobenzaprine (FLEXERIL) 10 MG tablet Take 0.5-1 tablets by mouth at bedtime.    ? diclofenac (VOLTAREN) 50 MG EC tablet     ? docusate sodium (COLACE) 100 MG capsule Take 300 mg by mouth at bedtime.    ? esomeprazole (NEXIUM) 20 MG capsule Take 1 capsule (20 mg total) by mouth daily as needed.    ? gabapentin (NEURONTIN) 300 MG capsule Take 300 mg by mouth as needed.    ? glucose  blood (ACCU-CHEK GUIDE) test strip Use as instructed to test blood sugar once daily and as needed.  Diagnosis:  E11.9  Non insulin dependent. 100 each 3  ? levothyroxine (SYNTHROID) 112 MCG tablet Take 1 tablet (112 mcg total) by mouth daily. 90 tablet 3  ? metFORMIN (GLUCOPHAGE-XR) 500 MG 24 hr tablet Take 2 tablets (1,000 mg total) by mouth daily with breakfast. 180 tablet 3  ? PENNSAID 2 % SOLN     ? RESTASIS 0.05 % ophthalmic emulsion     ? rosuvastatin (CRESTOR) 10 MG tablet Take 1 tablet (10 mg total) by mouth daily. 90 tablet 3  ? temazepam (RESTORIL) 15 MG capsule TAKE 1 TO 2 CAPSULES AT    BEDTIME AS NEEDED FOR SLEEP 180 capsule 1  ? ?No current facility-administered medications for this visit.  ? ?Known Allergies:  ?Allergies  ?Allergen Reactions  ? Celebrex [Celecoxib] Hives  ? ?Past Surgical History: ?Past Surgical History:  ?Procedure Laterality Date  ? BREAST BIOPSY  01/29/2012  ? Procedure: BREAST BIOPSY WITH NEEDLE LOCALIZATION;  Surgeon: Haywood Lasso, MD;  Location: [facility];  Service: General;  Laterality: Left;  ? CHOLECYSTECTOMY  2001  ? lap choli  ? COLONOSCOPY  2006  ? normal  ? FRACTURE SURGERY  2001  ? orif rt fif with knee surg  ? [place]  ? rt and lt  ? HERNIA REPAIR  03  ? rt ing hernia  ? KNEE ARTHROSCOPY  2000  ? rt  ? TEAR DUCT PROBING Bilateral 2017  ? and plug placement to treat dry eye  ? ?Family History: ?Family History  ?Problem Relation Age of Onset  ? Atrial fibrillation Mother   ? Arthritis Mother   ? Diabetes Mother   ? Heart failure Father   ? Atrial fibrillation Father   ? Colon polyps Father   ? Heart disease Maternal Grandmother   ? Heart disease Maternal Grandfather   ? Colon cancer Neg Hx   ? Rectal cancer Neg Hx   ? Stomach cancer Neg Hx   ? Breast cancer Neg Hx   ? ?Social History: [name] lives in a house built 2 3 years ago, dry environment, carpet floors, gas heating, central AC, pet dog, no cockroaches, no dust mite protection, no  smoke exposure, she is retired, no HEPA filter in the home.  ? ?ROS:  ?All other systems negative except as noted per HPI. ? ?Objective:  ?Blood pressure 126/82, pulse 76, temperature 98.1 ?F (36.7 ?C), resp. rate 16, height '5\' 3"'  (1.6 m), weight 205 lb 6 oz (93.2 kg), SpO2 99 %. ?Body mass index is 36.38 kg/m?Marland Kitchen ?Physical Exam: ? ?General Appearance:  Alert, cooperative, no distress, appears stated age  ?Head:  Normocephalic, without obvious abnormality, atraumatic  ?Eyes:  Conjunctiva clear, EOM's intact  ?Nose: Nares normal, normal posterior oropharynx  ?Throat: Lips, tongue normal; teeth and gums normal, normal posterior oropharynx  ?Neck: Supple, symmetrical  ?Lungs:  clear to auscultation bilaterally, Respirations unlabored, no coughing  ?Heart:  regular rate and rhythm and no murmur, Appears well perfused  ?Extremities: No edema  ?Skin: Skin color, texture, turgor normal, no rashes or lesions on visualized portions of skin  ?Neurologic: No gross deficits  ? ? ? ?Diagnostics: ?Labs as below ?Assessment and Plan  ?Patient with selective IgA deficiency.  Discussed that for most people this is asymptomatic, but does carry increased risk of having concomitant autoimmune and/or allergies.  Her history up to this point is very reassuring.  There is no specific screening protocol for individuals with selective IgA deficiency.  However, did advise that she informed other healthcare providers so that she does receive proper work-up particularly if concern for autoimmune disease.  I suspect in her case she will hopefully remain asymptomatic. ? ?Selective IgA Deficiency ?-labs to screen the rest of your immune system to ensure no other issues though your history if very reassuring ?- please see information below about IgA deficiency; you can provide a copy to other healthcare providers as well ? ?Selective IgA Deficiency ?-Occurs in approximately 1:300-700 Caucasians (generally less in other  ethnicities) ? ?-Approximately 2/3 of those with IgA deficiency are completely asymptomatic ? ?-In the 1/3 of symptomatic patients, they may experience recurrent infections (viral infections, ear infections, sinus infections, p

## 2021-11-15 ENCOUNTER — Encounter: Payer: Self-pay | Admitting: Internal Medicine

## 2021-11-15 ENCOUNTER — Other Ambulatory Visit: Payer: Self-pay

## 2021-11-15 ENCOUNTER — Ambulatory Visit (INDEPENDENT_AMBULATORY_CARE_PROVIDER_SITE_OTHER): Payer: BC Managed Care – PPO | Admitting: Internal Medicine

## 2021-11-15 VITALS — BP 126/82 | HR 76 | Temp 98.1°F | Resp 16 | Ht 63.0 in | Wt 205.4 lb

## 2021-11-15 DIAGNOSIS — D802 Selective deficiency of immunoglobulin A [IgA]: Secondary | ICD-10-CM

## 2021-11-15 DIAGNOSIS — J310 Chronic rhinitis: Secondary | ICD-10-CM

## 2021-11-15 NOTE — Patient Instructions (Addendum)
Selective IgA Deficiency ?-labs to screen the rest of your immune system to ensure no other issues though your history if very reassuring ?- please see information below about IgA deficiency; you can provide a copy to other healthcare providers as well ? ?-Occurs in approximately 1:300-700 Caucasians (generally less in other ethnicities) ?-Approximately 2/3 of those with IgA deficiency are completely asymptomatic ?-In the 1/3 of symptomatic patients, they may experience recurrent infections (viral infections, ear infections, sinus infections, pneumonia, or gastrointestinal infections).   ? ?-IgA deficient patients also have a higher prevalence of allergies and asthma. ? ?-IgA-deficient patients are at increased risk for autoimmune diseases, including lupus-like illnesses and arthritis; hematologic disorders, including neutropenia and thrombocytopenia, and gastrointestinal illnesses, including Crohn's disease, ulcerative colitis, and celiac disease. ? ?-Patients with IgA deficiency are also at higher risk for gastrointestinal and lymphoid malignancies later in life. There are no guidelines for monitoring for these conditions. Please inform your doctor if any new symptoms develop.  ? ?-Patients with IgA deficiency may have a risk of an anaphylactic reaction to blood products (e.g. red blood cells and platelets). The risk appears to be small but some centers will transfuse products from IgA-deficient donors for IgA-deficient recipients or wash cells before they are transfused so inform your physician before receiving these products. ? ?-Tell your doctor if you are taking certain medications including phenytoin, carbamazepine, valproic acid, zonisamide, sulfasalazine, gold, penicillamine, hydroxychloroquine, and nonsteroidal anti-inflammatory drugs (NSAIDs), as these may affect your IgA level. ?

## 2021-11-16 LAB — HM MAMMOGRAPHY

## 2021-11-22 LAB — CBC WITH DIFFERENTIAL/PLATELET
Absolute Monocytes: 359 cells/uL (ref 200–950)
Basophils Absolute: 40 cells/uL (ref 0–200)
Basophils Relative: 0.7 %
Eosinophils Absolute: 131 cells/uL (ref 15–500)
Eosinophils Relative: 2.3 %
HCT: 37.6 % (ref 35.0–45.0)
Hemoglobin: 11.9 g/dL (ref 11.7–15.5)
Lymphs Abs: 1083 cells/uL (ref 850–3900)
MCH: 27.4 pg (ref 27.0–33.0)
MCHC: 31.6 g/dL — ABNORMAL LOW (ref 32.0–36.0)
MCV: 86.6 fL (ref 80.0–100.0)
MPV: 11.1 fL (ref 7.5–12.5)
Monocytes Relative: 6.3 %
Neutro Abs: 4087 cells/uL (ref 1500–7800)
Neutrophils Relative %: 71.7 %
Platelets: 140 10*3/uL (ref 140–400)
RBC: 4.34 10*6/uL (ref 3.80–5.10)
RDW: 13.3 % (ref 11.0–15.0)
Total Lymphocyte: 19 %
WBC: 5.7 10*3/uL (ref 3.8–10.8)

## 2021-11-22 LAB — STREP PNEUMONIAE 23 SEROTYPES IGG
Serotype 17 (17F): 1.3
Serotype 2 (2): 0.7
Serotype 20 (20): 0.5
Serotype 22 (22F): <0.3
Serotype 34 (10A): 0.4
Serotype 43 (11A): <0.3
Serotype 54 (15B): <0.3
Serotype 57 (19A): 2.2
Serotype 70 (33F): <0.3
Strep pneumo Type 12: <0.3
Strep pneumo Type 19: 1.4
Strep pneumo Type 4: <0.3
Strep pneumo Type 9: 0.7
Strep pneumoniae Type 1 Abs: 2.4
Strep pneumoniae Type 14 Abs: 10.4
Strep pneumoniae Type 18C Abs: 4.2
Strep pneumoniae Type 23F Abs: <0.3
Strep pneumoniae Type 3 Abs: 0.8
Strep pneumoniae Type 5 Abs: 7.8
Strep pneumoniae Type 6B Abs: <0.3
Strep pneumoniae Type 7F Abs: <0.3
Strep pneumoniae Type 8 Abs: 0.8
Strep pneumoniae Type 9N Abs: <0.3

## 2021-11-22 LAB — LYMPH ENUMERATION, BASIC & NK CELLS
%CD19 (Earliest B-cells): 8 % (ref 6–29)
%CD3 (Mature T-Cells): 75 % (ref 57–85)
%CD62: 16 % (ref 4–25)
%CD8 (Cytotoxic/Suppressor): 24 % (ref 12–42)
Absolute CD19: 91 cells/uL — ABNORMAL LOW (ref 110–660)
Absolute CD3: 799 cells/uL — ABNORMAL LOW (ref 840–3060)
Absolute CD4: 533 cells/uL (ref 490–1740)
Absolute CD62: 181 cells/uL (ref 70–760)
CD4 T Helper %: 52 % (ref 30–61)
CD4/CD8 Ratio: 2.11 (ref 0.86–5.00)
CD8 T Cell Abs: 252 cells/uL (ref 180–1170)
Total lymphocyte count: 1065 cells/uL (ref 850–3900)

## 2021-11-22 LAB — IGG, IGA, IGM
IgG (Immunoglobin G), Serum: 1204 mg/dL (ref 600–1640)
IgM, Serum: 85 mg/dL (ref 50–300)
Immunoglobulin A: <5 mg/dL — ABNORMAL LOW (ref 47–310)

## 2021-11-22 LAB — DIPHTHERIA / TETANUS ANTIBODY PANEL
Diphtheria Ab: 0.51 IU/mL
Tetanus Toxin Antibody, Total: 0.96 IU/mL

## 2021-11-23 ENCOUNTER — Other Ambulatory Visit: Payer: Self-pay | Admitting: Internal Medicine

## 2021-11-23 DIAGNOSIS — D802 Selective deficiency of immunoglobulin A [IgA]: Secondary | ICD-10-CM

## 2021-11-23 NOTE — Progress Notes (Signed)
Please let Christine Norman know that her immune evaluation has returned. ? 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 ? ?Otherwise her immune system appears to be intact.  She does not have coverage for many of the strep pneumonia bacterial strands, but this is likely due to never having received the strep pneumo 23 Valent vaccine.  I would recommend that she get this vaccine (can come to [facility] to get if we have it), or otherwise at her PCP.  We can then repeat the strep pneumo titers no sooner than 6 weeks following vaccination. ? ?Please let me know if she has any questions or concerns. ?I have placed the repeat titer orders.

## 2021-11-26 ENCOUNTER — Other Ambulatory Visit: Payer: Self-pay | Admitting: Family Medicine

## 2021-11-26 DIAGNOSIS — D802 Selective deficiency of immunoglobulin A [IgA]: Secondary | ICD-10-CM

## 2021-11-29 ENCOUNTER — Telehealth: Payer: Self-pay

## 2021-11-29 NOTE — Telephone Encounter (Signed)
F/u  Returning call back to the CMA.  

## 2021-11-29 NOTE — Telephone Encounter (Signed)
Called patient back and scheduled NV and lab appt; see results note.  ?

## 2021-12-05 ENCOUNTER — Ambulatory Visit (INDEPENDENT_AMBULATORY_CARE_PROVIDER_SITE_OTHER): Payer: BC Managed Care – PPO

## 2021-12-05 DIAGNOSIS — Z23 Encounter for immunization: Secondary | ICD-10-CM | POA: Diagnosis not present

## 2021-12-05 NOTE — Progress Notes (Signed)
Pt came in to receive a pneumococcal 23 vaccine , pt was given  injection rt deltoid  w/o an concerns   ?

## 2022-01-16 ENCOUNTER — Other Ambulatory Visit (INDEPENDENT_AMBULATORY_CARE_PROVIDER_SITE_OTHER): Payer: BC Managed Care – PPO

## 2022-01-16 DIAGNOSIS — D802 Selective deficiency of immunoglobulin A [IgA]: Secondary | ICD-10-CM

## 2022-01-19 LAB — STREP PNEUMONIAE 23 SEROTYPES IGG
Serotype 17 (17F): 62.8
Serotype 2 (2): 9.4
Serotype 20 (20): 8.4
Serotype 22 (22F): <0.3
Serotype 34 (10A): 9.1
Serotype 43 (11A): <0.3
Serotype 54 (15B): 1.6
Serotype 57 (19A): 128.6
Serotype 70 (33F): 1.4
Strep pneumo Type 12: <0.3
Strep pneumo Type 19: 7.7
Strep pneumo Type 4: 0.8
Strep pneumo Type 9: 8.1
Strep pneumoniae Type 1 Abs: 13.9
Strep pneumoniae Type 14 Abs: 87.7
Strep pneumoniae Type 18C Abs: 22.6
Strep pneumoniae Type 23F Abs: 15.1
Strep pneumoniae Type 3 Abs: >44
Strep pneumoniae Type 5 Abs: >154
Strep pneumoniae Type 6B Abs: <0.3
Strep pneumoniae Type 7F Abs: 2.5
Strep pneumoniae Type 8 Abs: 29.9
Strep pneumoniae Type 9N Abs: >41

## 2022-01-22 NOTE — Progress Notes (Signed)
Sounds good

## 2022-01-22 NOTE — Progress Notes (Signed)
Okay. She doesn't have to follow-up unless she has other concerns.

## 2022-01-22 NOTE — Progress Notes (Signed)
Please call Christine Norman to let her know that her response to the pneumococcal vaccine was excellent.    Her immune system is otherwise functioning well and although she does have selective IgA deficiency which we discussed in clinic, I expect her to be mostly asymptomatic due to this.   She is welcome to follow-up in the future if she has any other concerns.  Please let me know if she has any questions.

## 2022-02-12 ENCOUNTER — Ambulatory Visit: Payer: BC Managed Care – PPO | Admitting: Family Medicine

## 2022-02-14 ENCOUNTER — Ambulatory Visit (INDEPENDENT_AMBULATORY_CARE_PROVIDER_SITE_OTHER): Payer: BC Managed Care – PPO | Admitting: Orthopaedic Surgery

## 2022-02-14 ENCOUNTER — Ambulatory Visit: Payer: Self-pay

## 2022-02-14 ENCOUNTER — Ambulatory Visit (INDEPENDENT_AMBULATORY_CARE_PROVIDER_SITE_OTHER): Payer: BC Managed Care – PPO

## 2022-02-14 ENCOUNTER — Other Ambulatory Visit: Payer: Self-pay

## 2022-02-14 DIAGNOSIS — M25562 Pain in left knee: Secondary | ICD-10-CM | POA: Diagnosis not present

## 2022-02-14 DIAGNOSIS — G8929 Other chronic pain: Secondary | ICD-10-CM

## 2022-02-14 DIAGNOSIS — M25561 Pain in right knee: Secondary | ICD-10-CM | POA: Diagnosis not present

## 2022-02-14 NOTE — Progress Notes (Signed)
Office Visit Note   Patient: Christine Norman           Date of Birth: Jun 24, 1961           MRN: [id number] Visit Date: 02/14/2022              Requested by: Tonia Ghent, MD [address] [place],  [place] [address] PCP: Tonia Ghent, MD   Assessment & Plan: Visit Diagnoses:  1. Chronic pain of left knee   2. Chronic pain of right knee     Plan: Given the amount of discomfort she has with her left knee combined with the failure of all modes of conservative treatment including activity modification, anti-inflammatories, steroid injections and hyaluronic acid injections, a MRI of the left knee is warranted to assess the cartilage and meniscus to help determine a better treatment plan.  This will help [place] determine whether or not she is a candidate for knee replacement surgery as well.  She agrees with this treatment plan.  We will see her back in follow-up after having that MRI of her left knee.  Follow-Up Instructions: Return in about 2 weeks (around 02/28/2022).   Orders:  Orders Placed This Encounter  Procedures   XR Knee 1-2 Views Right   XR Knee 1-2 Views Left   No orders of the defined types were placed in this encounter.     Procedures: No procedures performed   Clinical Data: No additional findings.   Subjective: Chief Complaint  Patient presents with   Left Knee - Pain   Right Knee - Pain  The patient is a very pleasant 61 year old female who I am seeing for the first time as a patient.  I have taken care of her mother.  She has chronic bilateral knee pain it is worse if she has been sitting for a while and gets up to walk.  It is worse going up and down stairs as well.  She has remote history of both knees having arthroscopic surgery with the right knee scoped a very long time ago and left knee having arthroscopic surgery in 2019.  She has had multiple steroid injections in both knees and hyaluronic acid several times in both knees.  She is a diabetic but  has a hemoglobin A1c below 7.  She has had physical therapy remotely in the past but nothing recently for her knees.  The last time she had injections in her knees was pre-COVID.  Both knees have global tenderness but again it sounds like a lot of this is osteoarthritic related.  There has been some locking catching.  The left knee hurts worse than the right knee.  HPI  Review of Systems There is currently listed no headache, chest pain, shortness of breath, fever, chills, nausea, vomiting  Objective: Vital Signs: There were no vitals taken for this visit.  Physical Exam She is alert and orient x3 and in no acute distress Ortho Exam Both knees today show no effusion.  Both have excellent range of motion but global tenderness mainly in the medial compartment of the patellofemoral joint of both knees.  There is slight patellofemoral crepitation.  Both knees are ligamentously stable. Specialty Comments:  No specialty comments available.  Imaging: XR Knee 1-2 Views Left  Result Date: 02/14/2022 2 views of the left knee show mild to moderate arthritis involving the medial compartment and the patellofemoral joint with narrowing.  XR Knee 1-2 Views Right  Result Date: 02/14/2022 2 views  the right knee show no acute findings.  There is no effusion.  The medial lateral compartments are well-maintained and there is slight osteophytes at the patellofemoral joint.    PMFS History: Patient Active Problem List   Diagnosis Date Noted   B12 deficiency 11/08/2021   IgA deficiency, selective ([place]) 11/08/2021   Knee pain 07/20/2020   Abnormal CBC 07/20/2020   Allergic reaction to insect bite 05/03/2020   Nephrolithiasis 12/05/2019   Pelvic pain 11/03/2019   Encounter for routine gynecological examination 11/02/2019   LLQ pain 11/02/2019   Vaginal atrophy 11/02/2019   Routine general medical examination at a health care facility 05/20/2019   Advance care planning 05/20/2019   Back pain  05/20/2019   Diabetes mellitus without complication ([place]) 18/56/3149   Hypersomnolence 01/21/2018   Insomnia 07/20/2015   Varicose veins of both lower extremities with complications 70/26/3785   Chronic venous insufficiency 04/07/2015   History of deep vein thrombosis 04/07/2015   Bilateral carpal tunnel syndrome 03/11/2014   Numbness in both legs 03/11/2014   Hyperlipidemia    Hypothyroidism    Arthritis    History of hyperglycemia    Hypertension    GERD (gastroesophageal reflux disease)    Depression    Past Medical History:  Diagnosis Date   Allergy    seasonal   Arthritis    ankles and back, s/p epidural injections   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    Atypical mole 07/09/2012   Right Scalpula (excised)   Back pain    Constipation    chronic- takes OTC med that helps at times and others not    Depression    Diabetes mellitus without complication ([facility])    DVT (deep venous thrombosis), right 2000   rt knee-ankle   GERD (gastroesophageal reflux disease)    IBS   History of COVID-19    Hyperlipidemia    Hypertension    not medicated   Insomnia    Thyroid disease    hypothyroidism    Family History  Problem Relation Age of Onset   Atrial fibrillation Mother    Arthritis Mother    Diabetes Mother    Heart failure Father    Atrial fibrillation Father    Colon polyps Father    Heart disease Maternal Grandmother    Heart disease Maternal Grandfather    Colon cancer Neg Hx    Rectal cancer Neg Hx    Stomach cancer Neg Hx    Breast cancer Neg Hx     Past Surgical History:  Procedure Laterality Date   BREAST BIOPSY  01/29/2012   Procedure: BREAST BIOPSY WITH NEEDLE LOCALIZATION;  Surgeon: Haywood Lasso, MD;  Location: [place];  Service: General;  Laterality: Left;   CHOLECYSTECTOMY  2001   lap choli    COLONOSCOPY  2006   normal   FRACTURE SURGERY  2001   orif rt fif with knee surg   HAMMER TOE SURGERY  1997   rt and lt   HERNIA REPAIR  03   rt ing hernia   KNEE ARTHROSCOPY  2000   rt   TEAR DUCT PROBING Bilateral 2017   and plug placement to treat dry eye   Social History   Occupational History   Not on file  Tobacco Use   Smoking status: Former    Packs/day: 0.50  Years: 20.00    Total pack years: 10.00    Types: Cigarettes    Quit date: 03/21/2011    Years since quitting: 10.9   Smokeless tobacco: Never  Vaping Use   Vaping Use: Former   Devices: vaped for about 1 year  Substance and Sexual Activity   Alcohol use: Yes    Alcohol/week: 0.0 standard drinks of alcohol    Comment: one a month-socially   Drug use: No   Sexual activity: Never

## 2022-02-22 ENCOUNTER — Ambulatory Visit: Payer: BC Managed Care – PPO | Admitting: Family Medicine

## 2022-03-07 ENCOUNTER — Ambulatory Visit: Payer: BC Managed Care – PPO | Admitting: Orthopaedic Surgery

## 2022-03-15 ENCOUNTER — Encounter: Payer: Self-pay | Admitting: Family Medicine

## 2022-03-15 DIAGNOSIS — E119 Type 2 diabetes mellitus without complications: Secondary | ICD-10-CM

## 2022-03-16 MED ORDER — METFORMIN HCL ER 500 MG PO TB24: 1000.0000 mg | ORAL_TABLET | Freq: Every day | ORAL | 3 refills | Status: DC

## 2022-03-21 ENCOUNTER — Encounter: Payer: Self-pay | Admitting: Orthopaedic Surgery

## 2022-03-21 ENCOUNTER — Ambulatory Visit (INDEPENDENT_AMBULATORY_CARE_PROVIDER_SITE_OTHER): Payer: BC Managed Care – PPO | Admitting: Orthopaedic Surgery

## 2022-03-21 DIAGNOSIS — M1711 Unilateral primary osteoarthritis, right knee: Secondary | ICD-10-CM | POA: Diagnosis not present

## 2022-03-21 DIAGNOSIS — M25561 Pain in right knee: Secondary | ICD-10-CM | POA: Diagnosis not present

## 2022-03-21 DIAGNOSIS — M1712 Unilateral primary osteoarthritis, left knee: Secondary | ICD-10-CM | POA: Diagnosis not present

## 2022-03-21 DIAGNOSIS — M170 Bilateral primary osteoarthritis of knee: Secondary | ICD-10-CM

## 2022-03-21 DIAGNOSIS — M25562 Pain in left knee: Secondary | ICD-10-CM

## 2022-03-21 DIAGNOSIS — G8929 Other chronic pain: Secondary | ICD-10-CM

## 2022-03-21 NOTE — Progress Notes (Signed)
The patient comes in today to go over a MRI of her left knee.  She has had chronic issues with both knees with the left worse than the right.  She had arthroscopic surgery in 2019 by one of our colleagues in town of the left knee and it continues to hurt her significantly.  She has had locking and catching with that knee and has had hyaluronic acid way remotely in the past.  She had had steroid injections but the steroid injections were not helping at this point.  The MRIs reviewed with her of her left knee.  There is areas of full-thickness cartilage loss at the weightbearing medial compartment of the knee as well as the patellofemoral joint.  There are osteophytes in all 3 compartments.  There is chronic tearing of the medial and lateral meniscus and significant degeneration of the ACL.  From my standpoint the only treatment options from a surgical standpoint would be a knee replacement.  Arthroscopic surgery would not help at this point.  Given the osteoarthritis in her knee she would like to at least try hyaluronic acid 1 more time.  It has been a while since she has had this type of injections for her knee and I think this is reasonable to try this for both knees given the arthritis in both knees.  We will see if we get this ordered for her.  I did show her knee replacement model and described what the surgery involves as well.  Both knees have slight varus malalignment with medial joint line tenderness and patellofemoral crepitation.

## 2022-03-22 ENCOUNTER — Telehealth: Payer: Self-pay

## 2022-03-22 NOTE — Telephone Encounter (Signed)
Bilateral knee gel injections 

## 2022-03-23 NOTE — Telephone Encounter (Signed)
Noted  

## 2022-03-26 NOTE — Telephone Encounter (Signed)
VOB submitted for SynviscOne, bilateral knee  

## 2022-04-02 ENCOUNTER — Telehealth: Payer: Self-pay

## 2022-04-02 NOTE — Telephone Encounter (Signed)
Faxed completed PA form to [employer] at [phone] for SynviscOne, bilateral knee. PA pending  

## 2022-04-03 ENCOUNTER — Telehealth: Payer: Self-pay | Admitting: Orthopaedic Surgery

## 2022-04-03 ENCOUNTER — Telehealth: Payer: Self-pay

## 2022-04-03 NOTE — Telephone Encounter (Signed)
Patient called asked for a call back as soon as an approval for her gel injection comes through. Patient asked if she can get an appointment 04/12/2022 if approval comes back this week. The number to contact patient is [phone]

## 2022-04-03 NOTE — Telephone Encounter (Signed)
Talked with patient concerning gel injection.  Advised patient once I receive an approval from [facility], I will give her a call to schedule.  Patient voiced that she understands.

## 2022-04-03 NOTE — Telephone Encounter (Signed)
Due to patient having [employer] [place], faxed completed PA to [facility] for SynviscOne, bilateral knee. PA pending

## 2022-04-12 ENCOUNTER — Telehealth: Payer: Self-pay

## 2022-04-12 NOTE — Telephone Encounter (Signed)
Talked with Christine Normanto check status for pre-determination for SynviscOne, bilateral knee and was advised that it will take 9 calendar days from when fax was received on 04/03/2022. Call reference# Christine Norman8/24/2023  Pre-Determination still pending Case# [id number]

## 2022-04-24 ENCOUNTER — Telehealth: Payer: Self-pay

## 2022-04-24 DIAGNOSIS — M1712 Unilateral primary osteoarthritis, left knee: Secondary | ICD-10-CM

## 2022-04-24 DIAGNOSIS — M1711 Unilateral primary osteoarthritis, right knee: Secondary | ICD-10-CM

## 2022-04-24 NOTE — Telephone Encounter (Signed)
Called and left a Vm for patient to CB to schedule with Dr. Ninfa Linden or Artis Delay for gel injection.

## 2022-05-10 ENCOUNTER — Encounter: Payer: Self-pay | Admitting: Physician Assistant

## 2022-05-10 ENCOUNTER — Ambulatory Visit (INDEPENDENT_AMBULATORY_CARE_PROVIDER_SITE_OTHER): Payer: BC Managed Care – PPO | Admitting: Physician Assistant

## 2022-05-10 DIAGNOSIS — M1711 Unilateral primary osteoarthritis, right knee: Secondary | ICD-10-CM | POA: Diagnosis not present

## 2022-05-10 DIAGNOSIS — M170 Bilateral primary osteoarthritis of knee: Secondary | ICD-10-CM | POA: Diagnosis not present

## 2022-05-10 DIAGNOSIS — M1712 Unilateral primary osteoarthritis, left knee: Secondary | ICD-10-CM | POA: Diagnosis not present

## 2022-05-10 MED ORDER — HYLAN G-F 20 48 MG/6ML IX SOSY
48.0000 mg | PREFILLED_SYRINGE | INTRA_ARTICULAR | Status: AC | PRN
  Administered 2022-05-10: 48 mg via INTRA_ARTICULAR

## 2022-05-10 MED ORDER — LIDOCAINE HCL 1 % IJ SOLN
3.0000 mL | INTRAMUSCULAR | Status: AC | PRN
  Administered 2022-05-10: 3 mL

## 2022-05-10 NOTE — Progress Notes (Addendum)
   Procedure Note  Patient: Christine Norman             Date of Birth: May 11, 1961           MRN: [id number]             Visit Date: 05/10/2022  HPI: Mrs. Mozingo comes in today for scheduled Synvisc 1 injections both knees.  She denies any changes falls or injuries.  She has known tricompartmental osteoarthritis both knees.  She is try conservative measures including Synvisc 1 injections in the past and is here because she would like to try these again before proceeding with surgery.  She has no scheduled surgery in the next 6 months on either knee.  Physical exam: Bilateral knees no abnormal warmth erythema or effusion.  Good range of motion both knees.  Procedures: Visit Diagnoses:  1. Unilateral primary osteoarthritis, left knee   2. Unilateral primary osteoarthritis, right knee     Large Joint Inj: bilateral knee on 05/10/2022 4:56 PM Indications: pain Details: 22 G 1.5 in needle, anterolateral approach  Arthrogram: No  Medications (Right): 3 mL lidocaine 1 %; 48 mg Hylan 48 MG/6ML Medications (Left): 3 mL lidocaine 1 %; 48 mg Hylan 48 MG/6ML Outcome: tolerated well, no immediate complications Procedure, treatment alternatives, risks and benefits explained, specific risks discussed. Consent was given by the patient. Immediately prior to procedure a time out was called to verify the correct patient, procedure, equipment, support staff and site/side marked as required. Patient was prepped and draped in the usual sterile fashion.    Plan: She knows to wait least 6 months between injections.  She will follow-up with [place] as needed.  Tolerated injections well today. Addendum Lot number [id number] expiration March 2026 both Synvisc One injections.

## 2022-06-04 ENCOUNTER — Other Ambulatory Visit: Payer: Self-pay | Admitting: Family Medicine

## 2022-06-04 DIAGNOSIS — G4700 Insomnia, unspecified: Secondary | ICD-10-CM

## 2022-06-04 NOTE — Telephone Encounter (Signed)
Refill request for TEMAZEPAM 15 MG CAPSULE  LOV - 11/06/21 Next OV - not scheduled Last refill - 08/07/21 #180/1

## 2022-06-05 NOTE — Telephone Encounter (Signed)
Please schedule yearly CPE.  Thanks.

## 2022-06-18 ENCOUNTER — Other Ambulatory Visit: Payer: Self-pay | Admitting: Family Medicine

## 2022-07-04 ENCOUNTER — Other Ambulatory Visit: Payer: Self-pay | Admitting: Family Medicine

## 2022-07-27 ENCOUNTER — Ambulatory Visit (INDEPENDENT_AMBULATORY_CARE_PROVIDER_SITE_OTHER): Payer: BC Managed Care – PPO | Admitting: Podiatry

## 2022-07-27 DIAGNOSIS — L989 Disorder of the skin and subcutaneous tissue, unspecified: Secondary | ICD-10-CM | POA: Diagnosis not present

## 2022-07-27 NOTE — Progress Notes (Unsigned)
Chief Complaint  Patient presents with   Callouses    Patient is here for callous on right foot with burning.patient states that she used to see Dr. Blenda Mounts for plantar fasciitis and would like to get some new orthotics.    Subjective: 61 y.o. female presenting to the office today as a reestablish new patient for evaluation of pain with burning sensation secondary to calluses of the feet.  Pain with ambulation.  She denies a history of injury.  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    Atypical mole 07/09/2012   Right Scalpula (excised)   Back pain    Constipation    chronic- takes OTC med that helps at times and others not    Depression    Diabetes mellitus without complication ([place])    DVT (deep venous thrombosis), right 2000   rt knee-ankle   GERD (gastroesophageal reflux disease)    IBS   History of COVID-19    Hyperlipidemia    Hypertension    not medicated   Insomnia    Thyroid disease    hypothyroidism    Past Surgical History:  Procedure Laterality Date   BREAST BIOPSY  01/29/2012   Procedure: BREAST BIOPSY WITH NEEDLE LOCALIZATION;  Surgeon: Haywood Lasso, MD;  Location: [place];  Service: General;  Laterality: Left;   CHOLECYSTECTOMY  2001   lap choli   COLONOSCOPY  2006   normal   FRACTURE SURGERY  2001   orif rt fif with knee surg   HAMMER TOE SURGERY  1997   rt and lt   HERNIA REPAIR  03   rt ing hernia   KNEE ARTHROSCOPY  2000   rt   TEAR DUCT PROBING Bilateral 2017   and plug placement to treat dry eye    Allergies  Allergen Reactions   Celebrex [Celecoxib] Hives     Objective:  Physical Exam General: Alert and  oriented x3 in no acute distress  Dermatology: Hyperkeratotic lesion(s) present on the left foot. Pain on palpation with a central nucleated core noted. Skin is warm, dry and supple bilateral lower extremities. Negative for open lesions or macerations.  Vascular: Palpable pedal pulses bilaterally. No edema or erythema noted. Capillary refill within normal limits.  Neurological: Epicritic and protective threshold grossly intact bilaterally.   Musculoskeletal Exam: Pain on palpation at the keratotic lesion(s) noted. Range of motion within normal limits bilateral. Muscle strength 5/5 in all groups bilateral.  Assessment: 1.  Symptomatic callus; benign skin lesion left foot   Plan of Care:  1. Patient evaluated 2. Excisional debridement of keratoic lesion(s) using a chisel blade was performed without incident.  3.  Advised insulin barefoot.  Recommend good supportive shoes and sneakers 4. Patient is to return to the clinic PRN.   Edrick Kins, DPM [facility]  Dr. Edrick Kins, DPM    [address], [place] [address]  Office [phone]  Fax [phone]

## 2022-08-02 ENCOUNTER — Other Ambulatory Visit (INDEPENDENT_AMBULATORY_CARE_PROVIDER_SITE_OTHER): Payer: BC Managed Care – PPO

## 2022-08-02 ENCOUNTER — Other Ambulatory Visit: Payer: Self-pay | Admitting: Family Medicine

## 2022-08-02 DIAGNOSIS — E538 Deficiency of other specified B group vitamins: Secondary | ICD-10-CM

## 2022-08-02 DIAGNOSIS — E119 Type 2 diabetes mellitus without complications: Secondary | ICD-10-CM

## 2022-08-02 DIAGNOSIS — G8929 Other chronic pain: Secondary | ICD-10-CM

## 2022-08-02 NOTE — Addendum Note (Signed)
Addended by: Francella Solian on: 08/02/2022 10:23 AM   Modules accepted: Orders

## 2022-08-03 LAB — CBC WITH DIFFERENTIAL/PLATELET
Absolute Monocytes: 480 cells/uL (ref 200–950)
Basophils Absolute: 38 cells/uL (ref 0–200)
Basophils Relative: 0.5 %
Eosinophils Absolute: 203 cells/uL (ref 15–500)
Eosinophils Relative: 2.7 %
HCT: 36.2 % (ref 35.0–45.0)
Hemoglobin: 11.9 g/dL (ref 11.7–15.5)
Lymphs Abs: 1103 cells/uL (ref 850–3900)
MCH: 28.5 pg (ref 27.0–33.0)
MCHC: 32.9 g/dL (ref 32.0–36.0)
MCV: 86.8 fL (ref 80.0–100.0)
Monocytes Relative: 6.4 %
Neutro Abs: 5678 cells/uL (ref 1500–7800)
Neutrophils Relative %: 75.7 %
RBC: 4.17 10*6/uL (ref 3.80–5.10)
RDW: 13.7 % (ref 11.0–15.0)
Total Lymphocyte: 14.7 %
WBC: 7.5 10*3/uL (ref 3.8–10.8)

## 2022-08-03 LAB — COMPREHENSIVE METABOLIC PANEL
AG Ratio: 1.6 (calc) (ref 1.0–2.5)
ALT: 15 U/L (ref 6–29)
AST: 16 U/L (ref 10–35)
Albumin: 4 g/dL (ref 3.6–5.1)
Alkaline phosphatase (APISO): 69 U/L (ref 37–153)
BUN: 17 mg/dL (ref 7–25)
CO2: 23 mmol/L (ref 20–32)
Calcium: 8.9 mg/dL (ref 8.6–10.4)
Chloride: 106 mmol/L (ref 98–110)
Creat: 0.93 mg/dL (ref 0.50–1.03)
Globulin: 2.5 g/dL (calc) (ref 1.9–3.7)
Glucose, Bld: 241 mg/dL — ABNORMAL HIGH (ref 65–99)
Potassium: 4.6 mmol/L (ref 3.5–5.3)
Sodium: 139 mmol/L (ref 135–146)
Total Bilirubin: 0.4 mg/dL (ref 0.2–1.2)
Total Protein: 6.5 g/dL (ref 6.1–8.1)

## 2022-08-03 LAB — HEMOGLOBIN A1C
Hgb A1c MFr Bld: 8.7 % of total Hgb — ABNORMAL HIGH (ref <5.7)
Mean Plasma Glucose: 203 mg/dL
eAG (mmol/L): 11.2 mmol/L

## 2022-08-03 LAB — MICROALBUMIN / CREATININE URINE RATIO
Creatinine, Urine: 141 mg/dL (ref 20–275)
Microalb Creat Ratio: 3 mcg/mg creat (ref <30)
Microalb, Ur: 0.4 mg/dL

## 2022-08-03 LAB — LIPID PANEL
Cholesterol: 140 mg/dL (ref <200)
HDL: 46 mg/dL — ABNORMAL LOW (ref >=50)
LDL Cholesterol (Calc): 66 mg/dL (calc)
Non-HDL Cholesterol (Calc): 94 mg/dL (calc) (ref <130)
Total CHOL/HDL Ratio: 3 (calc) (ref <5.0)
Triglycerides: 216 mg/dL — ABNORMAL HIGH (ref <150)

## 2022-08-03 LAB — VITAMIN B12: Vitamin B-12: 1608 pg/mL — ABNORMAL HIGH (ref 200–1100)

## 2022-08-03 LAB — TSH: TSH: 11.51 mIU/L — ABNORMAL HIGH (ref 0.40–4.50)

## 2022-08-07 ENCOUNTER — Encounter: Payer: Self-pay | Admitting: Family Medicine

## 2022-08-07 ENCOUNTER — Telehealth (INDEPENDENT_AMBULATORY_CARE_PROVIDER_SITE_OTHER): Payer: BC Managed Care – PPO | Admitting: Family Medicine

## 2022-08-07 VITALS — Ht 63.0 in | Wt 202.0 lb

## 2022-08-07 DIAGNOSIS — J069 Acute upper respiratory infection, unspecified: Secondary | ICD-10-CM

## 2022-08-07 MED ORDER — DOXYCYCLINE HYCLATE 100 MG PO TABS: 100.0000 mg | ORAL_TABLET | Freq: Two times a day (BID) | ORAL | 0 refills | Status: DC

## 2022-08-07 NOTE — Progress Notes (Unsigned)
Virtual visit completed through WebEx or similar program Patient location: home  Provider location: [place] at [facility], office  Participants: Patient and me (unless stated otherwise below)  Limitations and rationale for visit method d/w patient.  Patient agreed to proceed.   CC: URI sx  [id number] has been having runny nose, congestion, cough since Thursday. Covid test negative x 2. Took amoxicillin x 7 days and just finished it but no better.    Yellow nasal discharge in the AM.  Some cough.  She is speaking in complete sentences.  No fevers.  No facial pain now..  Prev with root canal addressed.  Some sputum.  She has cough syrup to use at night.    Her mother was in the hospital with sepsis.  Patient is staying with mother.  Her diet and life has been affected in the meantime.  She is giving her mother two IV infusions daily.    Meds and allergies reviewed.   ROS: Per HPI unless specifically indicated in ROS section   NAD Speech wnl  A/P: URI.  Doxy.  Continue with rest and fluids.

## 2022-08-08 NOTE — Assessment & Plan Note (Signed)
URI.  Okay for outpatient follow-up.  Given duration will start doxycycline.  Continue with rest and p.o. fluids.  Routine cautions given to patient.  She will update me as needed.  She agrees to plan.

## 2022-08-09 ENCOUNTER — Ambulatory Visit (INDEPENDENT_AMBULATORY_CARE_PROVIDER_SITE_OTHER): Payer: BC Managed Care – PPO | Admitting: Family Medicine

## 2022-08-09 ENCOUNTER — Encounter: Payer: Self-pay | Admitting: Family Medicine

## 2022-08-09 VITALS — BP 120/78 | HR 79 | Temp 96.5°F | Ht 63.0 in | Wt 201.0 lb

## 2022-08-09 DIAGNOSIS — Z0000 Encounter for general adult medical examination without abnormal findings: Secondary | ICD-10-CM | POA: Diagnosis not present

## 2022-08-09 DIAGNOSIS — E039 Hypothyroidism, unspecified: Secondary | ICD-10-CM

## 2022-08-09 DIAGNOSIS — E119 Type 2 diabetes mellitus without complications: Secondary | ICD-10-CM

## 2022-08-09 DIAGNOSIS — Z7189 Other specified counseling: Secondary | ICD-10-CM

## 2022-08-09 DIAGNOSIS — L409 Psoriasis, unspecified: Secondary | ICD-10-CM

## 2022-08-09 DIAGNOSIS — G4700 Insomnia, unspecified: Secondary | ICD-10-CM

## 2022-08-09 DIAGNOSIS — E785 Hyperlipidemia, unspecified: Secondary | ICD-10-CM

## 2022-08-09 DIAGNOSIS — J069 Acute upper respiratory infection, unspecified: Secondary | ICD-10-CM

## 2022-08-09 MED ORDER — METFORMIN HCL ER 500 MG PO TB24: 1500.0000 mg | ORAL_TABLET | Freq: Every day | ORAL | 3 refills | Status: DC

## 2022-08-09 MED ORDER — CLOBETASOL PROPIONATE 0.05 % EX FOAM: Freq: Two times a day (BID) | CUTANEOUS | 6 refills | Status: DC

## 2022-08-09 MED ORDER — CLOBETASOL PROPIONATE 0.05 % EX SHAM: 1.0000 "application " | MEDICATED_SHAMPOO | Freq: Every day | CUTANEOUS | Status: AC

## 2022-08-09 MED ORDER — ACCU-CHEK FASTCLIX LANCETS MISC: 3 refills | Status: AC

## 2022-08-09 MED ORDER — LEVOTHYROXINE SODIUM 125 MCG PO TABS: 125.0000 ug | ORAL_TABLET | Freq: Every day | ORAL | 3 refills | Status: DC

## 2022-08-09 MED ORDER — ACCU-CHEK GUIDE VI STRP: ORAL_STRIP | 3 refills | Status: AC

## 2022-08-09 NOTE — Patient Instructions (Addendum)
Try 3 metformin.  Let me know if not tolerated or effective.  Increase levothyroxine.  Take care.  Glad to see you. Recheck lab in about 3 months prior to a visit.

## 2022-08-09 NOTE — Progress Notes (Signed)
CPE- See plan.  Routine anticipatory guidance given to patient.  See health maintenance.  The possibility exists that previously documented standard health maintenance information may have been brought forward from a previous encounter into this note.  If needed, that same information has been updated to reflect the current situation based on today's encounter.    Tetanus 2020 Flu 2023, done at [facility].   Shingles d/w pt.   covid vaccine 2022 PNA 2023 DXA not due  Pap 2021 Mammogram 2023 Colonoscopy 2016.  [name] designated if patient were incapacitated.   Diet and exercise d/w pt.   Diabetes:  Using medications without difficulties: yes Hypoglycemic episodes:no sx Hyperglycemic episodes: no sx Feet problems: no Blood Sugars averaging: not checked.  eye exam within last year: done 3 weeks, Dr. Gwynn Burly at [facility] eye.   Diet and exercise disrupted caring for her mother.    Hypothyroidism.  TSH elevated, d/w pt about dose change.  Compliant.  Swallowing well.  No neck mass or lump.    She passed a kidney stone this year.    Insomnia.  Taking temazepam prn. No ADE on med.   Elevated Cholesterol: Using medications without problems: yes Muscle aches: some L leg cramp but not exertional.  Diet compliance: d/w pt.   Exercise: d/w pt.    URI/cough sx.  On doxy.   Some better.  No fevers.    Psoriasis improved with topical foam.  D/w pt.  Rx sent.    PMH and SH reviewed  Meds, vitals, and allergies reviewed.   ROS: Per HPI.  Unless specifically indicated otherwise in HPI, the patient denies:  General: fever. Eyes: acute vision changes ENT: sore throat Cardiovascular: chest pain Respiratory: SOB GI: vomiting GU: dysuria Musculoskeletal: acute back pain Derm: acute rash Neuro: acute motor dysfunction Psych: worsening mood Endocrine: polydipsia Heme: bleeding Allergy: hayfever  GEN: nad, alert and oriented HEENT: ncat NECK: supple w/o LA CV: rrr. PULM: ctab, no  inc wob ABD: soft, +bs EXT: no edema SKIN: no acute rash  Diabetic foot exam: Normal inspection No skin breakdown Minimal L 5th callus, no ulceration.   Normal DP pulses Normal sensation to light touch and monofilament Nails normal

## 2022-08-12 DIAGNOSIS — L409 Psoriasis, unspecified: Secondary | ICD-10-CM | POA: Insufficient documentation

## 2022-08-12 NOTE — Assessment & Plan Note (Signed)
Increase levothyroxine and recheck TSH later on.  Discussed.

## 2022-08-12 NOTE — Assessment & Plan Note (Signed)
She has a lot going on, caring for her mother.  Diet and exercise have been affected in the meantime.  Discussed. Increase to 3 tablets of metformin let me know if not tolerated or effective.  Recheck lab in about 3 months prior to a visit.  Continue work on diet and exercise.

## 2022-08-12 NOTE — Assessment & Plan Note (Signed)
Some better in the meantime.  Will finish doxycycline and update me as needed.

## 2022-08-12 NOTE — Assessment & Plan Note (Signed)
Tetanus 2020 Flu 2023, done at [facility].   Shingles d/w pt.   covid vaccine 2022 PNA 2023 DXA not due  Pap 2021 Mammogram 2023 Colonoscopy 2016.  [name] designated if patient were incapacitated.   Diet and exercise d/w pt.

## 2022-08-12 NOTE — Assessment & Plan Note (Signed)
Continue Crestor.  Continue work on diet and exercise. 

## 2022-08-12 NOTE — Assessment & Plan Note (Signed)
  Christine Norman designated if patient were incapacitated.

## 2022-08-12 NOTE — Assessment & Plan Note (Signed)
Continue as needed temazepam.

## 2022-08-12 NOTE — Assessment & Plan Note (Signed)
Continue clobetasol foam and update me as needed.

## 2022-09-21 ENCOUNTER — Encounter: Payer: Self-pay | Admitting: Family Medicine

## 2022-09-21 ENCOUNTER — Telehealth (INDEPENDENT_AMBULATORY_CARE_PROVIDER_SITE_OTHER): Payer: BC Managed Care – PPO | Admitting: Family Medicine

## 2022-09-21 VITALS — Temp 98.1°F | Ht 63.0 in

## 2022-09-21 DIAGNOSIS — U071 COVID-19: Secondary | ICD-10-CM

## 2022-09-21 MED ORDER — NIRMATRELVIR/RITONAVIR (PAXLOVID)TABLET: 3.0000 | ORAL_TABLET | Freq: Two times a day (BID) | ORAL | 0 refills | Status: AC

## 2022-09-21 NOTE — Progress Notes (Signed)
VIRTUAL VISIT A virtual visit is felt to be most appropriate for this patient at this time.   I connected with the patient on 09/21/22 at  9:40 AM EST by virtual telehealth platform and verified that I am speaking with the correct person using two identifiers.   I discussed the limitations, risks, security and privacy concerns of performing an evaluation and management service by  virtual telehealth platform and the availability of in person appointments. I also discussed with the patient that there may be a patient responsible charge related to this service. The patient expressed understanding and agreed to proceed.  Patient location: Home Provider Location: [facility] Participants: Keondria Siever Diona Browner and Angelena Form   Chief Complaint  Patient presents with   Covid Positive    Positive Home Test Yesterday Symptoms started on Wednesday   Hoarse   Cough   Nasal Congestion   Scratchy Throat   Fatigue    History of Present Illness:  62 y.o. female patient of Tonia Ghent, MD with history of hypertension, diabetes and IgA deficiency presents with new onset COVID infection. Tested positive Date of onset: January 31, on day 3 of illness She had a positive home COVID test on February 1.  She reports her symptoms started with nasal congestion and scratchy throat and have progressed to fatigue, hoarseness and dry cough.  No SOB, no wheeze. Mild chest pressure with walking.  No fever. No ear pain, no face pain. Mild ear pressure.   Husband had COVID.   She has not taken any OTC meds.    No history of chronic lung disease such as asthma or COPD. Former smoker.   COVID 19 screen No recent travel or known exposure to COVID19 The patient denies respiratory symptoms of COVID 19 at this time.  The importance of social distancing was discussed today.   Review of Systems  Constitutional:  Positive for malaise/fatigue. Negative for chills and fever.  HENT:  Positive for congestion  and sore throat. Negative for ear pain.   Eyes:  Negative for pain and redness.  Respiratory:  Positive for cough. Negative for shortness of breath.   Cardiovascular:  Negative for chest pain, palpitations and leg swelling.  Gastrointestinal:  Negative for abdominal pain, blood in stool, constipation, diarrhea, nausea and vomiting.  Genitourinary:  Negative for dysuria.  Musculoskeletal:  Negative for falls and myalgias.  Skin:  Negative for rash.  Neurological:  Negative for dizziness.  Psychiatric/Behavioral:  Negative for depression. The patient is not nervous/anxious.       Past Medical History:  Diagnosis Date   Allergy    seasonal   Arthritis    ankles and back, s/p epidural injections   Atypical ductal hyperplasia of breast 01/18/2012   Surgical Biopsy on 01/29/2012 showed a complex sclerosing lesion including sclerosing adenosis, stromal fibrosis, and cysts.    Atypical mole 07/09/2012   Right Scalpula (excised)   Back pain    Constipation    chronic- takes OTC med that helps at times and others not    Depression    Diabetes mellitus without complication (HCC)    DVT (deep venous thrombosis), right 2000   rt knee-ankle   GERD (gastroesophageal reflux disease)    IBS   History of COVID-19    Hyperlipidemia    Hypertension    not medicated   Insomnia    Psoriasis    Thyroid disease    hypothyroidism    reports that she quit smoking about  11 years ago. Her smoking use included cigarettes. She has a 10.00 pack-year smoking history. She has never used smokeless tobacco. She reports current alcohol use. She reports that she does not use drugs.   Current Outpatient Medications:    Accu-Chek FastClix Lancets MISC, Use to test blood sugar daily. DX: E11.9, Disp: 102 each, Rfl: 3   Blood Glucose Monitoring Suppl (ACCU-CHEK GUIDE) w/Device KIT, 1 kit by Subdermal route daily. Use to test blood sugar daily. DX: E11.9, Disp: 1 kit, Rfl: 0   Cholecalciferol (VITAMIN D3) 25 MCG  (1000 UT) CAPS, Take 1 capsule (1,000 Units total) by mouth daily., Disp: , Rfl:    clobetasol (OLUX) 0.05 % topical foam, Apply topically 2 (two) times daily., Disp: 50 g, Rfl: 6   Clobetasol Propionate 0.05 % shampoo, Apply 1 application  topically daily., Disp: , Rfl:    cyclobenzaprine (FLEXERIL) 10 MG tablet, Take 0.5-1 tablets by mouth at bedtime., Disp: , Rfl:    diclofenac (VOLTAREN) 50 MG EC tablet, Take 50 mg by mouth daily., Disp: , Rfl:    docusate sodium (COLACE) 100 MG capsule, Take 300 mg by mouth at bedtime., Disp: , Rfl:    esomeprazole (NEXIUM) 20 MG capsule, Take 1 capsule (20 mg total) by mouth daily as needed., Disp: , Rfl:    gabapentin (NEURONTIN) 300 MG capsule, Take 300 mg by mouth as needed., Disp: , Rfl:    glucose blood (ACCU-CHEK GUIDE) test strip, Use as instructed to test blood sugar once daily and as needed.  Diagnosis:  E11.9  Non insulin dependent., Disp: 100 each, Rfl: 3   levothyroxine (SYNTHROID) 125 MCG tablet, Take 1 tablet (125 mcg total) by mouth daily., Disp: 90 tablet, Rfl: 3   metFORMIN (GLUCOPHAGE-XR) 500 MG 24 hr tablet, Take 3 tablets (1,500 mg total) by mouth daily with breakfast., Disp: 270 tablet, Rfl: 3   nirmatrelvir/ritonavir (PAXLOVID) 20 x 150 MG & 10 x '100MG'$  TABS, Take 3 tablets by mouth 2 (two) times daily for 5 days. Patient GFR is normal. Take nirmatrelvir (150 mg) 3 tablet(s) twice daily for 5 days and ritonavir (100 mg) one tablet twice daily for 5 days., Disp: 30 tablet, Rfl: 0   RESTASIS 0.05 % ophthalmic emulsion, , Disp: , Rfl:    rosuvastatin (CRESTOR) 10 MG tablet, TAKE 1 TABLET DAILY, Disp: 90 tablet, Rfl: 3   temazepam (RESTORIL) 15 MG capsule, TAKE 1 TO 2 CAPSULES AT BEDTIME AS NEEDED FOR SLEEP, Disp: 180 capsule, Rfl: 1   Observations/Objective: Temperature 98.1 F (36.7 C), temperature source Oral, height '5\' 3"'$  (1.6 m), SpO2 98 %.  Physical Exam Constitutional:      General: The patient is not in acute distress. Pulmonary:      Effort: Pulmonary effort is normal. No respiratory distress.  Neurological:     Mental Status: The patient is alert and oriented to person, place, and time.  Psychiatric:        Mood and Affect: Mood normal.        Behavior: Behavior normal.    Assessment and Plan COVID-19 virus infection Assessment & Plan: COVID19  infection . No clear sign of bacterial infection at this time.   No SOB.  No red flags/need for ER visit or in-person exam at respiratory clinic at this time..    Pt high risk for COVID complications given  HTN, DM, obesity and age.  Reviewed options for antiviral treatment.  GFR > 60, no hepatic impairment. Will start paxlovid x  5 days. Reviewed med interactions.. she will hold statin while on Paxlovid.    If SOB begins symptoms worsening.. have low threshold for in-person exam, if severe shortness of breath ER visit recommended.  Can monitor Oxygen saturation at home with home monitor if able to obtain.  Go to ER if O2 sat < 90% on room air.   Reviewed home care and provided information through [facility].  Recommended quarantine until test returns. If returns positive 5 days isolation recommended. Return to work day 6 and wear mask for 4 more days to complete 10 days. Provided info about prevention of spread of COVID 19.   Other orders -     nirmatrelvir/ritonavir; Take 3 tablets by mouth 2 (two) times daily for 5 days. Patient GFR is normal. Take nirmatrelvir (150 mg) 3 tablet(s) twice daily for 5 days and ritonavir (100 mg) one tablet twice daily for 5 days.  Dispense: 30 tablet; Refill: 0      I discussed the assessment and treatment plan with the patient. The patient was provided an opportunity to ask questions and all were answered. The patient agreed with the plan and demonstrated an understanding of the instructions.   The patient was advised to call back or seek an in-person evaluation if the symptoms worsen or if the condition fails to improve as  anticipated.     Eliezer Lofts, MD

## 2022-09-21 NOTE — Assessment & Plan Note (Signed)
COVID19  infection . No clear sign of bacterial infection at this time.   No SOB.  No red flags/need for ER visit or in-person exam at respiratory clinic at this time..    Pt high risk for COVID complications given  HTN, DM, obesity and age.  Reviewed options for antiviral treatment.  GFR > 60, no hepatic impairment. Will start paxlovid x 5 days. Reviewed med interactions.. she will hold statin while on Paxlovid.    If SOB begins symptoms worsening.. have low threshold for in-person exam, if severe shortness of breath ER visit recommended.  Can monitor Oxygen saturation at home with home monitor if able to obtain.  Go to ER if O2 sat < 90% on room air.   Reviewed home care and provided information through [facility].  Recommended quarantine until test returns. If returns positive 5 days isolation recommended. Return to work day 6 and wear mask for 4 more days to complete 10 days. Provided info about prevention of spread of COVID 19.

## 2022-10-12 ENCOUNTER — Other Ambulatory Visit: Payer: Self-pay | Admitting: Family Medicine

## 2022-10-25 ENCOUNTER — Encounter: Payer: Self-pay | Admitting: Radiology

## 2022-11-21 ENCOUNTER — Telehealth: Payer: Self-pay | Admitting: Family Medicine

## 2022-11-21 NOTE — Telephone Encounter (Signed)
Spoke with patient and advised she would take 1000 mcg B12 daily. Patient verbalized understanding.

## 2022-11-21 NOTE — Telephone Encounter (Signed)
Patient called and asked if we knew the dosage amount of vitamin B12 she was recommended by Dr. Damita Dunnings at an [facility] 11/06/21. States she has been taking it for a while but misplaced the bottle and does not know what the dose is. Is not listed on her meds list. Please advise [phone], thank you.

## 2022-11-23 LAB — HM MAMMOGRAPHY

## 2022-11-30 ENCOUNTER — Encounter: Payer: Self-pay | Admitting: Family Medicine

## 2022-12-03 NOTE — Telephone Encounter (Signed)
LOV - 09/21/22 NOV - not scheduled RF - 06/05/22 #180/1 temazepam

## 2022-12-05 ENCOUNTER — Other Ambulatory Visit: Payer: Self-pay | Admitting: Family Medicine

## 2022-12-05 DIAGNOSIS — E119 Type 2 diabetes mellitus without complications: Secondary | ICD-10-CM

## 2022-12-05 DIAGNOSIS — G4700 Insomnia, unspecified: Secondary | ICD-10-CM

## 2022-12-05 MED ORDER — TEMAZEPAM 15 MG PO CAPS
ORAL_CAPSULE | ORAL | 1 refills | Status: DC
Start: 2022-12-05 — End: 2023-05-29

## 2022-12-05 MED ORDER — METFORMIN HCL ER 500 MG PO TB24
1500.0000 mg | ORAL_TABLET | Freq: Every day | ORAL | 3 refills | Status: DC
Start: 2022-12-05 — End: 2023-10-29

## 2022-12-05 MED ORDER — LEVOTHYROXINE SODIUM 125 MCG PO TABS: 125.0000 ug | ORAL_TABLET | Freq: Every day | ORAL | 3 refills | Status: DC

## 2022-12-14 ENCOUNTER — Telehealth: Payer: Self-pay | Admitting: Physician Assistant

## 2022-12-14 NOTE — Telephone Encounter (Signed)
Please get auth.

## 2022-12-14 NOTE — Telephone Encounter (Signed)
Patient would Bil Knee Gel injections last injections was 05/10/2022

## 2022-12-14 NOTE — Telephone Encounter (Signed)
VOB submitted for Orthovisc, bilateral knee 

## 2023-01-07 ENCOUNTER — Telehealth: Payer: Self-pay | Admitting: Orthopedic Surgery

## 2023-01-07 NOTE — Telephone Encounter (Signed)
Talked with patient concerning gel injections.  Appts.scheduled

## 2023-01-07 NOTE — Telephone Encounter (Signed)
Christine Norman is calling to check on the status of her gel injection approval.  Per note, VOB was submitted for bilateral knees on 12/14/22.  Her call back # is [phone].

## 2023-01-18 ENCOUNTER — Other Ambulatory Visit: Payer: Self-pay

## 2023-01-18 DIAGNOSIS — M1711 Unilateral primary osteoarthritis, right knee: Secondary | ICD-10-CM

## 2023-01-18 DIAGNOSIS — M1712 Unilateral primary osteoarthritis, left knee: Secondary | ICD-10-CM

## 2023-01-28 ENCOUNTER — Ambulatory Visit: Payer: BC Managed Care – PPO | Admitting: Physician Assistant

## 2023-01-29 ENCOUNTER — Encounter: Payer: Self-pay | Admitting: Orthopaedic Surgery

## 2023-01-29 ENCOUNTER — Ambulatory Visit: Payer: BC Managed Care – PPO | Admitting: Orthopaedic Surgery

## 2023-01-29 DIAGNOSIS — M1712 Unilateral primary osteoarthritis, left knee: Secondary | ICD-10-CM

## 2023-01-29 DIAGNOSIS — M1711 Unilateral primary osteoarthritis, right knee: Secondary | ICD-10-CM

## 2023-01-29 MED ORDER — HYALURONAN 30 MG/2ML IX SOSY
30.0000 mg | PREFILLED_SYRINGE | INTRA_ARTICULAR | Status: AC | PRN
Start: 2023-01-29 — End: 2023-01-29
  Administered 2023-01-29: 30 mg via INTRA_ARTICULAR

## 2023-01-29 MED ORDER — HYALURONAN 30 MG/2ML IX SOSY
30.0000 mg | PREFILLED_SYRINGE | INTRA_ARTICULAR | Status: AC | PRN
  Administered 2023-01-29: 30 mg via INTRA_ARTICULAR

## 2023-01-29 NOTE — Progress Notes (Signed)
Office Visit Note   Patient: Christine Norman           Date of Birth: 03/12/1961           MRN: [id number] Visit Date: 01/29/2023              Requested by: Joaquim Nam, MD [address] [place],  [place] [address] PCP: Joaquim Nam, MD   Assessment & Plan: Visit Diagnoses:  1. Primary osteoarthritis of left knee   2. Primary osteoarthritis of right knee     Plan: [name] is here for bilateral Orthovisc injections.  This was her first round.  She tolerated the injections well.  Follow-up next week with either Dr. Magnus Ivan or go for second round.  Follow-Up Instructions: No follow-ups on file.   Orders:  No orders of the defined types were placed in this encounter.  No orders of the defined types were placed in this encounter.     Procedures: Large Joint Inj: bilateral knee on 01/29/2023 7:41 AM Indications: pain Details: 22 G needle  Arthrogram: No  Medications (Right): 30 mg Hyaluronan 30 MG/2ML Medications (Left): 30 mg Hyaluronan 30 MG/2ML Outcome: tolerated well, no immediate complications Patient was prepped and draped in the usual sterile fashion.       Clinical Data: No additional findings.   Subjective: No chief complaint on file.   HPI  Review of Systems   Objective: Vital Signs: There were no vitals taken for this visit.  Physical Exam  Ortho Exam  Specialty Comments:  No specialty comments available.  Imaging: No results found.   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   COVID-19 virus infection 01/17/2021   Knee pain 07/20/2020   Abnormal CBC 07/20/2020   Allergic reaction to insect bite 05/03/2020   Nephrolithiasis 12/05/2019   Pelvic pain 11/03/2019   LLQ pain 11/02/2019    Vaginal atrophy 11/02/2019   URI (upper respiratory infection) 08/26/2019   Routine general medical examination at a health care facility 05/20/2019   Advance care planning 05/20/2019   Back pain 05/20/2019   Diabetes mellitus without complication (HCC) 04/21/2018   Hypersomnolence 01/21/2018   Insomnia 07/20/2015   Varicose veins of both lower extremities with complications 04/07/2015   Chronic venous insufficiency 04/07/2015   History of deep vein thrombosis 04/07/2015   Bilateral carpal tunnel syndrome 03/11/2014   Numbness in both legs 03/11/2014   Hyperlipidemia    Hypothyroidism    Arthritis    Hypertension    GERD (gastroesophageal reflux disease)    Depression    Past Medical History:  Diagnosis Date   Allergy    seasonal   Arthritis    ankles and back, s/p epidural injections   Atypical ductal hyperplasia of breast 01/18/2012   Surgical Biopsy on 01/29/2012 showed a complex sclerosing lesion including sclerosing adenosis, stromal fibrosis, and cysts.    Atypical mole 07/09/2012   Right Scalpula (excised)   Back pain    Constipation    chronic- takes OTC med that helps at times and others not    Depression    Diabetes mellitus without complication (HCC)    DVT (deep venous thrombosis), right 2000   rt knee-ankle   GERD (gastroesophageal reflux disease)    IBS  History of COVID-19    Hyperlipidemia    Hypertension    not medicated   Insomnia    Psoriasis    Thyroid disease    hypothyroidism    Family History  Problem Relation Age of Onset   Atrial fibrillation Mother    Arthritis Mother    Diabetes Mother    Heart failure Father    Atrial fibrillation Father    Colon polyps Father    Heart disease Maternal Grandmother    Heart disease Maternal Grandfather    Colon cancer Neg Hx    Rectal cancer Neg Hx    Stomach cancer Neg Hx    Breast cancer Neg Hx     Past Surgical History:  Procedure Laterality Date   BREAST BIOPSY  01/29/2012   Procedure:  BREAST BIOPSY WITH NEEDLE LOCALIZATION;  Surgeon: Currie Paris, MD;  Location: [facility];  Service: General;  Laterality: Left;   CHOLECYSTECTOMY  2001   lap choli   COLONOSCOPY  2006   normal   FRACTURE SURGERY  2001   orif rt fif with knee surg   HAMMER TOE SURGERY  1997   rt and lt   HERNIA REPAIR  03   rt ing hernia   KNEE ARTHROSCOPY  2000   rt   TEAR DUCT PROBING Bilateral 2017   and plug placement to treat dry eye   Social History   Occupational History   Not on file  Tobacco Use   Smoking status: Former    Packs/day: 0.50    Years: 20.00    Additional pack years: 0.00    Total pack years: 10.00    Types: Cigarettes    Quit date: 03/21/2011    Years since quitting: 11.8   Smokeless tobacco: Never  Vaping Use   Vaping Use: Former   Devices: vaped for about 1 year  Substance and Sexual Activity   Alcohol use: Yes    Alcohol/week: 0.0 standard drinks of alcohol    Comment: one a month-socially   Drug use: No   Sexual activity: Never

## 2023-02-06 ENCOUNTER — Ambulatory Visit: Payer: BC Managed Care – PPO | Admitting: Physician Assistant

## 2023-02-06 DIAGNOSIS — M1712 Unilateral primary osteoarthritis, left knee: Secondary | ICD-10-CM

## 2023-02-06 DIAGNOSIS — M170 Bilateral primary osteoarthritis of knee: Secondary | ICD-10-CM

## 2023-02-06 DIAGNOSIS — M1711 Unilateral primary osteoarthritis, right knee: Secondary | ICD-10-CM

## 2023-02-06 DIAGNOSIS — I83893 Varicose veins of bilateral lower extremities with other complications: Secondary | ICD-10-CM

## 2023-02-06 MED ORDER — HYALURONAN 30 MG/2ML IX SOSY
30.0000 mg | PREFILLED_SYRINGE | INTRA_ARTICULAR | Status: AC | PRN
Start: 2023-02-06 — End: 2023-02-06
  Administered 2023-02-06: 30 mg via INTRA_ARTICULAR

## 2023-02-06 NOTE — Progress Notes (Signed)
Office Visit Note   Patient: Christine Norman           Date of Birth: [birth year]           MRN: [id number] Visit Date: 02/06/2023              Requested by: Joaquim Nam, MD [address] [place],  [place] [address] PCP: Joaquim Nam, MD  Chief Complaint  Patient presents with  . Left Knee - Follow-up  . Right Knee - Follow-up      HPI: Patient is a pleasant-year-old woman who is a patient of Dr. Eliberto Ivory.  She has a history of osteoarthritis of her bilateral knees.  She comes in today for her second bilateral Orthovisc injection.  Denies any fever or chills.  She did say she had a little bit more pain after the last injection but no redness or swelling  Assessment & Plan: Visit Diagnoses:  1. Unilateral primary osteoarthritis, right knee   2. Varicose veins of both lower extremities with complications     Plan: Injections given without difficulty today she will be out of town next week so we will follow-up with Delane Ginger for her final injections  Follow-Up Instructions: Return in about 2 weeks (around 02/20/2023).   Ortho Exam  Patient is alert, oriented, no adenopathy, well-dressed, normal affect, normal respiratory effort. Bilateral knees no effusion no erythema no redness compartments are soft and compressible she is neurovascular intact  Imaging: No results found.   Labs: Lab Results  Component Value Date   HGBA1C 8.7 (H) 08/02/2022   HGBA1C 6.6 (H) 11/06/2021   HGBA1C 6.7 (H) 08/07/2021   ESRSEDRATE 4 02/24/2014   LABORGA  11/21/2016    Multiple organisms present,each less than 10,000 CFU/mL. These organisms,commonly found on external and internal genitalia,are considered colonizers. No further testing performed.      Lab Results  Component Value Date   ALBUMIN 3.8 11/26/2019   ALBUMIN 2.6 (L) 11/26/2019   ALBUMIN 4.3 11/02/2015    Lab Results  Component Value Date   MG 2.3 06/14/2014   MG 2.0 02/24/2014   Lab Results  Component Value  Date   VD25OH 34 06/14/2014   VD25OH 40 02/24/2014   VD25OH 39 08/07/2013    No results found for: "PREALBUMIN"    Latest Ref Rng & Units 08/02/2022    9:29 AM 11/16/2021   11:32 AM 08/28/2021   11:25 AM  CBC EXTENDED  WBC 3.8 - 10.8 Thousand/uL 7.5  5.7  7.1   RBC 3.80 - 5.10 Million/uL 4.17  4.34  4.61   Hemoglobin 11.7 - 15.5 g/dL 21.3  08.6  57.8   HCT 35.0 - 45.0 % 36.2  37.6  41.9   Platelets  CANCELED  140  230   NEUT# 1,500 - 7,800 cells/uL 5,678  4,087  5.3   Lymph# 850 - 3,900 cells/uL 1,103  1,083  1.2      There is no height or weight on file to calculate BMI.  Orders:  No orders of the defined types were placed in this encounter.  No orders of the defined types were placed in this encounter.    Procedures: Large Joint Inj: bilateral knee on 02/06/2023 11:38 AM Indications: pain and diagnostic evaluation Details: 22 G 1.5 in needle, anteromedial approach  Arthrogram: No  Medications (Right): 30 mg Hyaluronan 30 MG/2ML Medications (Left): 30 mg Hyaluronan 30 MG/2ML Outcome: tolerated well, no immediate complications Procedure, treatment alternatives, risks and  benefits explained, specific risks discussed. Consent was given by the patient.    Clinical Data: No additional findings.  ROS:  All other systems negative, except as noted in the HPI. Review of Systems  Objective: Vital Signs: There were no vitals taken for this visit.  Specialty Comments:  No specialty comments available.  PMFS History: Patient Active Problem List   Diagnosis Date Noted  . Psoriasis 08/12/2022  . Unilateral primary osteoarthritis, left knee 03/21/2022  . Unilateral primary osteoarthritis, right knee 03/21/2022  . B12 deficiency 11/08/2021  . IgA deficiency, selective (HCC) 11/08/2021  . COVID-19 virus infection 01/17/2021  . Knee pain 07/20/2020  . Abnormal CBC 07/20/2020  . Allergic reaction to insect bite 05/03/2020  . Nephrolithiasis 12/05/2019  . Pelvic pain  11/03/2019  . LLQ pain 11/02/2019  . Vaginal atrophy 11/02/2019  . URI (upper respiratory infection) 08/26/2019  . Routine general medical examination at a health care facility 05/20/2019  . Advance care planning 05/20/2019  . Back pain 05/20/2019  . Diabetes mellitus without complication (HCC) 04/21/2018  . Hypersomnolence 01/21/2018  . Insomnia 07/20/2015  . Varicose veins of both lower extremities with complications 04/07/2015  . Chronic venous insufficiency 04/07/2015  . History of deep vein thrombosis 04/07/2015  . Bilateral carpal tunnel syndrome 03/11/2014  . Numbness in both legs 03/11/2014  . Hyperlipidemia   . Hypothyroidism   . Arthritis   . Hypertension   . GERD (gastroesophageal reflux disease)   . Depression    Past Medical History:  Diagnosis Date  . Allergy    seasonal  . Arthritis    ankles and back, s/p epidural injections  . 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   . Atypical mole 07/09/2012   Right Scalpula (excised)  . Back pain   . Constipation    chronic- takes OTC med that helps at times and others not   . Depression   . Diabetes mellitus without complication (HCC)   . DVT (deep venous thrombosis), right 2000   rt knee-ankle  . GERD (gastroesophageal reflux disease)    IBS  . History of COVID-19   . Hyperlipidemia   . Hypertension    not medicated  . Insomnia   . Psoriasis   . Thyroid disease    hypothyroidism    Family History  Problem Relation Age of Onset  . Atrial fibrillation Mother   . Arthritis Mother   . Diabetes Mother   . Heart failure Father   . Atrial fibrillation Father   . Colon polyps Father   . Heart disease Maternal Grandmother   . Heart disease Maternal Grandfather   . Colon cancer Neg Hx   . Rectal cancer Neg Hx   . Stomach cancer Neg Hx   . Breast cancer Neg Hx     Past  Surgical History:  Procedure Laterality Date  . BREAST BIOPSY  01/29/2012   Procedure: BREAST BIOPSY WITH NEEDLE LOCALIZATION;  Surgeon: Currie Paris, MD;  Location: [facility];  Service: General;  Laterality: Left;  . CHOLECYSTECTOMY  2001   lap choli  . COLONOSCOPY  2006   normal  . FRACTURE SURGERY  2001   orif rt fif with knee surg  . HAMMER TOE SURGERY  1997   rt and lt  . HERNIA REPAIR  03   rt ing hernia  . KNEE ARTHROSCOPY  2000  rt  . TEAR DUCT PROBING Bilateral 2017   and plug placement to treat dry eye   Social History   Occupational History  . Not on file  Tobacco Use  . Smoking status: Former    Packs/day: 0.50    Years: 20.00    Additional pack years: 0.00    Total pack years: 10.00    Types: Cigarettes    Quit date: 03/21/2011    Years since quitting: 11.8  . Smokeless tobacco: Never  Vaping Use  . Vaping Use: Former  . Devices: vaped for about 1 year  Substance and Sexual Activity  . Alcohol use: Yes    Alcohol/week: 0.0 standard drinks of alcohol    Comment: one a month-socially  . Drug use: No  . Sexual activity: Never

## 2023-02-18 ENCOUNTER — Encounter: Payer: Self-pay | Admitting: Physician Assistant

## 2023-02-18 ENCOUNTER — Ambulatory Visit: Payer: BC Managed Care – PPO | Admitting: Physician Assistant

## 2023-02-18 DIAGNOSIS — M1712 Unilateral primary osteoarthritis, left knee: Secondary | ICD-10-CM | POA: Diagnosis not present

## 2023-02-18 DIAGNOSIS — M170 Bilateral primary osteoarthritis of knee: Secondary | ICD-10-CM

## 2023-02-18 DIAGNOSIS — M1711 Unilateral primary osteoarthritis, right knee: Secondary | ICD-10-CM | POA: Diagnosis not present

## 2023-02-18 MED ORDER — HYALURONAN 30 MG/2ML IX SOSY
30.0000 mg | PREFILLED_SYRINGE | INTRA_ARTICULAR | Status: AC | PRN
Start: 2023-02-18 — End: 2023-02-18
  Administered 2023-02-18: 30 mg via INTRA_ARTICULAR

## 2023-02-18 NOTE — Progress Notes (Signed)
   Procedure Note  Patient: Christine Norman             Date of Birth: [birth year]           MRN: [id number]             Visit Date: 02/18/2023 HPI: [name] comes in today for her third set of Ortho visco injections.  She has had no relief as of yet.  No adverse effects.  Physical exam: Bilateral knees good range of motion.  No abnormal warmth erythema or effusion.  Procedures: Visit Diagnoses:  1. Primary osteoarthritis of right knee   2. Primary osteoarthritis of left knee     Large Joint Inj: bilateral knee on 02/18/2023 7:29 PM Indications: pain Details: 22 G 1.5 in needle, anterolateral approach  Arthrogram: No  Medications (Right): 30 mg Hyaluronan 30 MG/2ML Medications (Left): 30 mg Hyaluronan 30 MG/2ML Outcome: tolerated well, no immediate complications Procedure, treatment alternatives, risks and benefits explained, specific risks discussed. Consent was given by the patient. Immediately prior to procedure a time out was called to verify the correct patient, procedure, equipment, support staff and site/side marked as required. Patient was prepped and draped in the usual sterile fashion.     Plan: Patient tolerated injections well.  She knows to wait 6 months between injections with viscosupplementation.  Follow-up as needed.  Questions encouraged and answered at length

## 2023-05-23 ENCOUNTER — Ambulatory Visit: Payer: BC Managed Care – PPO | Admitting: Physician Assistant

## 2023-05-23 ENCOUNTER — Encounter: Payer: Self-pay | Admitting: Physician Assistant

## 2023-05-23 DIAGNOSIS — M170 Bilateral primary osteoarthritis of knee: Secondary | ICD-10-CM

## 2023-05-23 DIAGNOSIS — M1712 Unilateral primary osteoarthritis, left knee: Secondary | ICD-10-CM

## 2023-05-23 DIAGNOSIS — M1711 Unilateral primary osteoarthritis, right knee: Secondary | ICD-10-CM | POA: Diagnosis not present

## 2023-05-23 MED ORDER — LIDOCAINE HCL 1 % IJ SOLN
3.0000 mL | INTRAMUSCULAR | Status: AC | PRN
Start: 2023-05-23 — End: 2023-05-23
  Administered 2023-05-23: 3 mL

## 2023-05-23 MED ORDER — METHYLPREDNISOLONE ACETATE 40 MG/ML IJ SUSP
40.0000 mg | INTRAMUSCULAR | Status: AC | PRN
Start: 2023-05-23 — End: 2023-05-23
  Administered 2023-05-23: 40 mg via INTRA_ARTICULAR

## 2023-05-23 NOTE — Progress Notes (Signed)
Procedure Note  Patient: Christine Norman             Date of Birth: May 26, 1961           MRN: [id number]             Visit Date: 05/23/2023 HPI: Christine Norman comes in today due to bilateral knee pain.  She last had viscosupplementation injections on 02/18/2023 states that they initially helped for about 2 months.  She is requesting cortisone injections both knees.  She has known osteoarthritis both knees.  She denies any injury to either knee.  Review of systems: See HPI otherwise negative or noncontributory.  Physical exam: General Well-developed well-nourished pleasant female in no acute distress. Bilateral knees: Good range of motion of both knees.  No abnormal warmth erythema of either knee.  Plus minus effusion left knee.  Attempted aspiration of left knee ended in a dry tap.  No instability to either knee.  Procedures: Visit Diagnoses:  1. Primary osteoarthritis of right knee   2. Primary osteoarthritis of left knee     Large Joint Inj: bilateral knee on 05/23/2023 3:50 PM Indications: pain Details: 22 G 1.5 in needle, anterolateral approach  Arthrogram: No  Medications (Right): 3 mL lidocaine 1 %; 40 mg methylPREDNISolone acetate 40 MG/ML Medications (Left): 3 mL lidocaine 1 %; 40 mg methylPREDNISolone acetate 40 MG/ML Outcome: tolerated well, no immediate complications Procedure, treatment alternatives, risks and benefits explained, specific risks discussed. Consent was given by the patient. Immediately prior to procedure a time out was called to verify the correct patient, procedure, equipment, support staff and site/side marked as required. Patient was prepped and draped in the usual sterile fashion.      Plan: She knows to wait a least 6 months between viscosupplementation injections and 3 months between cortisone injections.  She will follow-up with [place] as needed.  Questions encouraged and answered at length

## 2023-05-28 ENCOUNTER — Other Ambulatory Visit: Payer: Self-pay | Admitting: Family Medicine

## 2023-05-28 DIAGNOSIS — G4700 Insomnia, unspecified: Secondary | ICD-10-CM

## 2023-07-22 LAB — HM DIABETES EYE EXAM

## 2023-08-05 ENCOUNTER — Other Ambulatory Visit: Payer: Self-pay | Admitting: Family Medicine

## 2023-08-05 DIAGNOSIS — E119 Type 2 diabetes mellitus without complications: Secondary | ICD-10-CM

## 2023-08-05 DIAGNOSIS — E538 Deficiency of other specified B group vitamins: Secondary | ICD-10-CM

## 2023-08-12 ENCOUNTER — Other Ambulatory Visit: Payer: Self-pay | Admitting: Family Medicine

## 2023-08-12 ENCOUNTER — Other Ambulatory Visit (INDEPENDENT_AMBULATORY_CARE_PROVIDER_SITE_OTHER): Payer: BC Managed Care – PPO

## 2023-08-12 DIAGNOSIS — L409 Psoriasis, unspecified: Secondary | ICD-10-CM

## 2023-08-12 DIAGNOSIS — E119 Type 2 diabetes mellitus without complications: Secondary | ICD-10-CM

## 2023-08-12 DIAGNOSIS — E538 Deficiency of other specified B group vitamins: Secondary | ICD-10-CM

## 2023-08-12 DIAGNOSIS — G4700 Insomnia, unspecified: Secondary | ICD-10-CM

## 2023-08-12 NOTE — Addendum Note (Signed)
Addended by: Vincenza Hews on: 08/12/2023 08:36 AM   Modules accepted: Orders

## 2023-08-13 LAB — COMPREHENSIVE METABOLIC PANEL
AG Ratio: 1.6 (calc) (ref 1.0–2.5)
ALT: 14 U/L (ref 6–29)
AST: 14 U/L (ref 10–35)
Albumin: 4 g/dL (ref 3.6–5.1)
Alkaline phosphatase (APISO): 74 U/L (ref 37–153)
BUN: 15 mg/dL (ref 7–25)
CO2: 27 mmol/L (ref 20–32)
Calcium: 8.9 mg/dL (ref 8.6–10.4)
Chloride: 105 mmol/L (ref 98–110)
Creat: 0.78 mg/dL (ref 0.50–1.05)
Globulin: 2.5 g/dL (ref 1.9–3.7)
Glucose, Bld: 158 mg/dL — ABNORMAL HIGH (ref 65–99)
Potassium: 4.5 mmol/L (ref 3.5–5.3)
Sodium: 140 mmol/L (ref 135–146)
Total Bilirubin: 0.3 mg/dL (ref 0.2–1.2)
Total Protein: 6.5 g/dL (ref 6.1–8.1)

## 2023-08-13 LAB — CBC WITH DIFFERENTIAL/PLATELET
Absolute Lymphocytes: 1183 {cells}/uL (ref 850–3900)
Absolute Monocytes: 389 {cells}/uL (ref 200–950)
Basophils Absolute: 52 {cells}/uL (ref 0–200)
Basophils Relative: 0.9 %
Eosinophils Absolute: 226 {cells}/uL (ref 15–500)
Eosinophils Relative: 3.9 %
HCT: 37.4 % (ref 35.0–45.0)
Hemoglobin: 12 g/dL (ref 11.7–15.5)
MCH: 27.7 pg (ref 27.0–33.0)
MCHC: 32.1 g/dL (ref 32.0–36.0)
MCV: 86.4 fL (ref 80.0–100.0)
MPV: 11.2 fL (ref 7.5–12.5)
Monocytes Relative: 6.7 %
Neutro Abs: 3950 {cells}/uL (ref 1500–7800)
Neutrophils Relative %: 68.1 %
Platelets: 133 10*3/uL — ABNORMAL LOW (ref 140–400)
RBC: 4.33 10*6/uL (ref 3.80–5.10)
RDW: 13.6 % (ref 11.0–15.0)
Total Lymphocyte: 20.4 %
WBC: 5.8 10*3/uL (ref 3.8–10.8)

## 2023-08-13 LAB — HEMOGLOBIN A1C
Hgb A1c MFr Bld: 7.5 %{Hb} — ABNORMAL HIGH (ref <5.7)
Mean Plasma Glucose: 169 mg/dL
eAG (mmol/L): 9.3 mmol/L

## 2023-08-13 LAB — LIPID PANEL
Cholesterol: 152 mg/dL (ref <200)
HDL: 52 mg/dL (ref >=50)
LDL Cholesterol (Calc): 71 mg/dL
Non-HDL Cholesterol (Calc): 100 mg/dL (ref <130)
Total CHOL/HDL Ratio: 2.9 (calc) (ref <5.0)
Triglycerides: 203 mg/dL — ABNORMAL HIGH (ref <150)

## 2023-08-13 LAB — MICROALBUMIN / CREATININE URINE RATIO
Creatinine, Urine: 144 mg/dL (ref 20–275)
Microalb Creat Ratio: 4 mg/g{creat} (ref <30)
Microalb, Ur: 0.6 mg/dL

## 2023-08-13 LAB — VITAMIN B12: Vitamin B-12: 1460 pg/mL — ABNORMAL HIGH (ref 200–1100)

## 2023-08-13 LAB — TSH: TSH: 0.66 m[IU]/L (ref 0.40–4.50)

## 2023-08-13 NOTE — Telephone Encounter (Signed)
Last office visit: video visity 09/21/22 Next office visit: nothing scheduled Last refill: CLOBETASOL PROP 0.05% FOAM 08/09/2022 50g 6 refills

## 2023-08-13 NOTE — Telephone Encounter (Signed)
Last office visit: video visit 09/21/22 Next office visit: nothing scheduled Last refill: TEMAZEPAM CAP 15MG  05/29/2023 180 capsules 0 refills

## 2023-08-15 NOTE — Telephone Encounter (Signed)
She has been scheduled in the meantime.  Thanks.

## 2023-08-19 ENCOUNTER — Encounter: Payer: Self-pay | Admitting: Family Medicine

## 2023-08-19 ENCOUNTER — Ambulatory Visit (INDEPENDENT_AMBULATORY_CARE_PROVIDER_SITE_OTHER): Payer: BC Managed Care – PPO | Admitting: Family Medicine

## 2023-08-19 VITALS — BP 128/78 | HR 81 | Temp 98.6°F | Ht 63.0 in | Wt 206.4 lb

## 2023-08-19 DIAGNOSIS — R160 Hepatomegaly, not elsewhere classified: Secondary | ICD-10-CM

## 2023-08-19 DIAGNOSIS — E039 Hypothyroidism, unspecified: Secondary | ICD-10-CM

## 2023-08-19 DIAGNOSIS — Z7189 Other specified counseling: Secondary | ICD-10-CM

## 2023-08-19 DIAGNOSIS — E119 Type 2 diabetes mellitus without complications: Secondary | ICD-10-CM

## 2023-08-19 DIAGNOSIS — Z0000 Encounter for general adult medical examination without abnormal findings: Secondary | ICD-10-CM | POA: Diagnosis not present

## 2023-08-19 DIAGNOSIS — E785 Hyperlipidemia, unspecified: Secondary | ICD-10-CM

## 2023-08-19 DIAGNOSIS — G4700 Insomnia, unspecified: Secondary | ICD-10-CM

## 2023-08-19 MED ORDER — ROSUVASTATIN CALCIUM 10 MG PO TABS: 10.0000 mg | ORAL_TABLET | Freq: Every day | ORAL | 3 refills | Status: AC

## 2023-08-19 NOTE — Patient Instructions (Signed)
Let me know if you don't get a call about the liver ultrasound.  Take care.  Glad to see you. Recheck A1c in about 3-4 months at a visit.  Your don't need to fast.

## 2023-08-19 NOTE — Progress Notes (Signed)
CPE- See plan.  Routine anticipatory guidance given to patient.  See health maintenance.  The possibility exists that previously documented standard health maintenance information may have been brought forward from a previous encounter into this note.  If needed, that same information has been updated to reflect the current situation based on today's encounter.    Tetanus 2020 Flu 2024   Shingles d/w pt.   covid vaccine 2022 PNA 2023 DXA not due  Pap 2021 Mammogram 2024 Colonoscopy 2016.  Madelin Headings designated if patient were incapacitated.   Diet and exercise d/w pt.   She is seeing urology re: stone hx.    She had hepatomegaly on CT.  D/w pt.   IMPRESSION: 1.  Stable nonobstructing left upper pole calculus. No hydronephrosis. 2.  Resolved left lower pole calculus. FINDINGS:   LOWER CHEST: Visualized lung bases and heart are unremarkable.   Abdomen/Pelvis:   LIVER: Hepatomegaly measuring 17.1 cm.   BILIARY: Prior cholecystectomy.   PANCREAS: Unremarkable   SPLEEN: Unremarkable.   ADRENALS: Unremarkable   KIDNEYS: Stable punctate left upper pole calculus measuring 0.2 cm. No hydronephrosis. Resolved left lower pole calculus.   STOMACH: Unremarkable   DUODENUM: Unremarkable   VASCULATURE: Unremarkable   LYMPHATIC: Unremarkable   SMALL & LARGE BOWEL: Colonic diverticula without evidence of acute diverticulitis.Marland Kitchen Unremarkable appendix.   BLADDER: Unremarkable   REPRODUCTIVE ORGANS: [Anteverted uterus.   ABDOMINAL WALL: Unremarkable   BONES/SOFT TISSUE: Unremarkable   Rare use of gabapentin.   Hypothyroid, still on replacement.  Compliant, TSH wnl.    Diabetes:  Using medications without difficulties: yes Hypoglycemic episodes: no Hyperglycemic episodes: no Feet problems: she is dealing with plantar fasciitis.   Blood Sugars averaging: not checked often. eye exam within last year: yes 3 metformin per day.   Diet d/w pt.    Discussed trying to get  liver u/s done prior to med change with DM.  Cautions d/w pt about ozempic, etc.    Elevated Cholesterol: Using medications without problems: yes Muscle aches: no Diet compliance: d/w pt.  Exercise: d/w pt.    Insomnia.  Still taking temazepam prn.  It helped. No ADE on med.   Patient is caring for her mother and I thanked her for her effort.    PMH and SH reviewed  Meds, vitals, and allergies reviewed.   ROS: Per HPI.  Unless specifically indicated otherwise in HPI, the patient denies:  General: fever. Eyes: acute vision changes ENT: sore throat Cardiovascular: chest pain Respiratory: SOB GI: vomiting GU: dysuria Musculoskeletal: acute back pain Derm: acute rash Neuro: acute motor dysfunction Psych: worsening mood Endocrine: polydipsia Heme: bleeding Allergy: hayfever  GEN: nad, alert and oriented HEENT: ncat NECK: supple w/o LA CV: rrr. PULM: ctab, no inc wob ABD: soft, +bs EXT: no edema SKIN: no acute rash  Diabetic foot exam: Normal inspection No skin breakdown No calluses  Normal DP pulses Normal sensation to light touch and monofilament Nails normal

## 2023-08-20 DIAGNOSIS — R160 Hepatomegaly, not elsewhere classified: Secondary | ICD-10-CM | POA: Insufficient documentation

## 2023-08-20 NOTE — Assessment & Plan Note (Signed)
LIVER: Hepatomegaly measuring 17.1 cm.  D/w pt about getting u/s done to better characterize.

## 2023-08-20 NOTE — Assessment & Plan Note (Signed)
  Christine Norman designated if patient were incapacitated.   

## 2023-08-20 NOTE — Assessment & Plan Note (Signed)
 Tetanus 2020 Flu 2024   Shingles d/w pt.   covid vaccine 2022 PNA 2023 DXA not due  Pap 2021 Mammogram 2024 Colonoscopy 2016.  Madelin Headings designated if patient were incapacitated.   Diet and exercise d/w pt.

## 2023-08-20 NOTE — Assessment & Plan Note (Signed)
Hypothyroid, still on replacement.  Compliant, TSH wnl.   Continue replacement as is.

## 2023-08-20 NOTE — Assessment & Plan Note (Signed)
Continue temazepam.  

## 2023-08-20 NOTE — Assessment & Plan Note (Signed)
Discussed diet and exercise.  Continue crestor.

## 2023-08-20 NOTE — Assessment & Plan Note (Addendum)
D/w pt about options.  Taking 3 metformin per day.   Diet d/w pt.    Discussed trying to get liver u/s done prior to med change with DM.  Cautions d/w pt about ozempic, etc.   Recheck A1c in about 3-4 months at a visit.

## 2023-08-23 ENCOUNTER — Telehealth: Payer: Self-pay | Admitting: Family Medicine

## 2023-08-23 ENCOUNTER — Encounter: Payer: Self-pay | Admitting: *Deleted

## 2023-08-23 NOTE — Telephone Encounter (Signed)
 Copied from CRM [id number]. Topic: Referral - Status >> Aug 23, 2023  3:02 PM Fredrica W wrote: Reason for CRM: Pt calling to check on status of imaging order for scan for liver. Order submitted, referral pending review. Per patient she reached out to insurance and is able to go anywhere that accepts insurance with provider approval. Pt checking on status of referral. Thank You.

## 2023-08-23 NOTE — Telephone Encounter (Signed)
 Spoke with patient to advise that the referral has been placed but we do have to allow 14 days. Patient will follow up with me next Friday if she has not heard anything.

## 2023-09-06 ENCOUNTER — Ambulatory Visit
Admission: RE | Admit: 2023-09-06 | Discharge: 2023-09-06 | Disposition: A | Discharge status: Home / Self Care | Payer: BC Managed Care – PPO | Source: Ambulatory Visit | Attending: Family Medicine | Admitting: Family Medicine

## 2023-09-06 DIAGNOSIS — R160 Hepatomegaly, not elsewhere classified: Secondary | ICD-10-CM

## 2023-09-08 ENCOUNTER — Encounter: Payer: Self-pay | Admitting: Family Medicine

## 2023-09-11 MED ORDER — OZEMPIC (0.25 OR 0.5 MG/DOSE) 2 MG/3ML ~~LOC~~ SOPN: PEN_INJECTOR | SUBCUTANEOUS | 2 refills | Status: AC

## 2023-09-26 ENCOUNTER — Telehealth: Payer: Self-pay

## 2023-09-26 NOTE — Telephone Encounter (Signed)
 VOB submitted for Monovisc, bilateral knee per patient request.

## 2023-09-27 NOTE — Telephone Encounter (Signed)
 Pt completed the US   Nothing further needed.

## 2023-09-30 ENCOUNTER — Telehealth: Payer: Self-pay

## 2023-09-30 NOTE — Telephone Encounter (Signed)
 Faxed completed PA form to [facility] at [phone] for Monovisc, bilateral knee PA pending

## 2023-10-08 ENCOUNTER — Telehealth: Payer: Self-pay

## 2023-10-08 NOTE — Telephone Encounter (Signed)
 Refaxed PA form to [facility] at [phone] for Monovisc, bilateral knee. PA pending

## 2023-10-16 ENCOUNTER — Other Ambulatory Visit: Payer: Self-pay

## 2023-10-16 DIAGNOSIS — M1712 Unilateral primary osteoarthritis, left knee: Secondary | ICD-10-CM

## 2023-10-16 DIAGNOSIS — M1711 Unilateral primary osteoarthritis, right knee: Secondary | ICD-10-CM

## 2023-10-29 ENCOUNTER — Ambulatory Visit (INDEPENDENT_AMBULATORY_CARE_PROVIDER_SITE_OTHER): Payer: BC Managed Care – PPO | Admitting: Physician Assistant

## 2023-10-29 ENCOUNTER — Other Ambulatory Visit: Payer: Self-pay

## 2023-10-29 ENCOUNTER — Encounter: Payer: Self-pay | Admitting: Physician Assistant

## 2023-10-29 DIAGNOSIS — E119 Type 2 diabetes mellitus without complications: Secondary | ICD-10-CM

## 2023-10-29 DIAGNOSIS — M1711 Unilateral primary osteoarthritis, right knee: Secondary | ICD-10-CM

## 2023-10-29 DIAGNOSIS — M170 Bilateral primary osteoarthritis of knee: Secondary | ICD-10-CM

## 2023-10-29 DIAGNOSIS — M1712 Unilateral primary osteoarthritis, left knee: Secondary | ICD-10-CM

## 2023-10-29 MED ORDER — HYALURONAN 88 MG/4ML IX SOSY
88.0000 mg | PREFILLED_SYRINGE | INTRA_ARTICULAR | Status: AC | PRN
  Administered 2023-10-29: 88 mg via INTRA_ARTICULAR

## 2023-10-29 MED ORDER — LEVOTHYROXINE SODIUM 125 MCG PO TABS: 125.0000 ug | ORAL_TABLET | Freq: Every day | ORAL | 3 refills | Status: AC

## 2023-10-29 MED ORDER — METFORMIN HCL ER 500 MG PO TB24: 1500.0000 mg | ORAL_TABLET | Freq: Every day | ORAL | 3 refills | Status: AC

## 2023-10-29 NOTE — Progress Notes (Signed)
   Procedure Note  Patient: Christine Norman             Date of Birth: Nov 29, 1960           MRN: [id number]             Visit Date: 10/29/2023 Christine Norman comes in today for scheduled Monovisc injections both knees.  She has known osteoarthritis both knees.  No scheduled surgery on either knee next 6 months.  She has tried cortisone injections in the past with mixed results.  Also hyaluronic injections in the past with good results.  Physical exam: General Well-developed well-nourished female no acute distress.   Bilateral knees full extension flexion beyond 90 degrees.  No abnormal warmth erythema or effusion of either knee.  Procedures: Visit Diagnoses:  1. Primary osteoarthritis of right knee   2. Primary osteoarthritis of left knee     Large Joint Inj: bilateral knee on 10/29/2023 1:04 PM Indications: pain Details: 22 G 1.5 in needle, anterolateral approach  Arthrogram: No  Medications (Right): 88 mg Hyaluronan 88 MG/4ML Medications (Left): 88 mg Hyaluronan 88 MG/4ML Outcome: tolerated well, no immediate complications Procedure, treatment alternatives, risks and benefits explained, specific risks discussed. Consent was given by the patient. Immediately prior to procedure a time out was called to verify the correct patient, procedure, equipment, support staff and site/side marked as required. Patient was prepped and draped in the usual sterile fashion.     Plan: Follow-up as needed she knows to wait at least 3 months between cortisone injections in 6 months between viscosupplementation injections.

## 2023-10-30 ENCOUNTER — Telehealth: Payer: Self-pay | Admitting: Physician Assistant

## 2023-10-30 NOTE — Telephone Encounter (Signed)
 Patient called. Says she is in a lot of pain today. Would like Morrie Sheldon to call her. [phone]

## 2023-10-30 NOTE — Telephone Encounter (Signed)
 Patient aware that it is common to have some discomfort after the gel injections and to ice/elevate/and use NSAIDS and to let me know if it doesn't get any better

## 2023-11-29 LAB — HM MAMMOGRAPHY

## 2023-12-02 ENCOUNTER — Telehealth: Payer: Self-pay

## 2023-12-02 NOTE — Telephone Encounter (Signed)
 Received order from [facility] in reference to completing a bone density order form. Patient is not due for a bone density test until she is 19. Spoke with patient who was under the impression that she needed one at 28. I did speak with Tonya at [facility] that its not needed

## 2024-02-06 ENCOUNTER — Other Ambulatory Visit (INDEPENDENT_AMBULATORY_CARE_PROVIDER_SITE_OTHER): Payer: Self-pay

## 2024-02-06 ENCOUNTER — Ambulatory Visit (INDEPENDENT_AMBULATORY_CARE_PROVIDER_SITE_OTHER): Admitting: Physician Assistant

## 2024-02-06 ENCOUNTER — Encounter: Payer: Self-pay | Admitting: Physician Assistant

## 2024-02-06 DIAGNOSIS — M722 Plantar fascial fibromatosis: Secondary | ICD-10-CM

## 2024-02-06 NOTE — Progress Notes (Signed)
 Office Visit Note   Patient: Christine Norman            Date of Birth: 1960-10-31           MRN: [id number] Visit Date: 02/06/2024              Requested by: Donnie Galea, MD [address] [place],  [place] [address] PCP: Donnie Galea, MD   Assessment & Plan: Visit Diagnoses:  1. Plantar fasciitis, left     Plan: She will continue to work on stretching exercises as shown for tight gastrocs.  Will have her see Dr. Vaughn Georges for shockwave therapy to the medial tubercle of the calcaneus left heel.  Follow-up with us  as needed pain persist or becomes worse.  Questions encouraged and answered  Follow-Up Instructions: Return if symptoms worsen or fail to improve.   Orders:  Orders Placed This Encounter  Procedures   XR Foot 2 Views Left   No orders of the defined types were placed in this encounter.     Procedures: No procedures performed   Clinical Data: No additional findings.   Subjective: Chief Complaint  Patient presents with   Left Foot - Pain    HPI Christine Norman is 63 year old female who comes in today with left foot pain.  Pains been bothering her since last more.  No injury.  Over the last 2 weeks the pains became worse.  Pain is worse with first step and also when if she pivots feels as if the heel may be ripping.  She has tried Voltaren gel and stretching exercises as shown.  She has been good shoes avoiding open back shoes. Review of Systems See HPI otherwise negative  Objective: Vital Signs: There were no vitals taken for this visit.  Physical Exam Constitutional:      Appearance: She is not ill-appearing or diaphoretic.   Cardiovascular:     Pulses: Normal pulses.  Pulmonary:     Effort: Pulmonary effort is normal.   Neurological:     Mental Status: She is alert.     Ortho Exam Left foot: No abnormal warmth erythema or rashes or impending ulcers.  5 out of 5 strength with inversion eversion against resistance.  Nontender over the posterior  tibial tendon and at its insertion.  Peroneal tendons slight tenderness just posterior to the lateral malleolus otherwise nontender.  Achilles is nontender.  Tight gastroc.  Tenderness medial tubercle of the calcaneus.  Nontender over the sesamoids and the MTP joints of the lesser toes.  Specialty Comments:  No specialty comments available.  Imaging: XR Foot 2 Views Left Result Date: 02/06/2024 Left foot 2 views: No acute fractures.  Calcaneal heel spur present.  Accessory navicular present.  Sesamoids well located.  Bipartite medial sesamoid present.  No other bony abnormalities or acute findings.    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   Knee pain 07/20/2020   Abnormal CBC 07/20/2020   Allergic reaction to insect bite 05/03/2020   Nephrolithiasis 12/05/2019   Pelvic pain 11/03/2019   LLQ pain 11/02/2019   Vaginal atrophy 11/02/2019   Routine general medical examination at a health care facility 05/20/2019   Advance care planning 05/20/2019   Back pain 05/20/2019   Diabetes  mellitus without complication (HCC) 04/21/2018   Hypersomnolence 01/21/2018   Insomnia 07/20/2015   Varicose veins of both lower extremities with complications 04/07/2015   Chronic venous insufficiency 04/07/2015   History of deep vein thrombosis 04/07/2015   Bilateral carpal tunnel syndrome 03/11/2014   Numbness in both legs 03/11/2014   Hyperlipidemia    Hypothyroidism    Arthritis    Hypertension    GERD (gastroesophageal reflux disease)    Depression    Past Medical History:  Diagnosis Date   Allergy    seasonal   Arthritis    ankles and back, s/p epidural injections   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    Atypical mole 07/09/2012   Right Scalpula (excised)   Back pain    Constipation    chronic- takes OTC med that helps at times and others not    Depression    Diabetes mellitus without complication (HCC)    DVT (deep venous thrombosis), right 2000   rt knee-ankle   GERD (gastroesophageal reflux disease)    IBS   History of COVID-19    Hyperlipidemia    Hypertension    not medicated   Insomnia    Psoriasis    Thyroid  disease    hypothyroidism    Family History  Problem Relation Age of Onset   Atrial fibrillation Mother    Arthritis Mother    Diabetes Mother    Heart failure Father    Atrial fibrillation Father    Colon polyps Father    Heart disease Maternal Grandmother    Heart disease Maternal Grandfather    Colon cancer Neg Hx    Rectal cancer Neg Hx    Stomach cancer Neg Hx    Breast cancer Neg Hx     Past Surgical History:  Procedure Laterality Date   BREAST BIOPSY  01/29/2012   Procedure: BREAST BIOPSY WITH NEEDLE LOCALIZATION;  Surgeon: Darcella Earnest, MD;  Location: [facility];  Service: General;  Laterality: Left;   CHOLECYSTECTOMY  2001   lap choli   COLONOSCOPY  2006   normal   FRACTURE SURGERY  2001   orif rt fif with knee surg   HAMMER TOE SURGERY  1997   rt and lt   HERNIA REPAIR  03   rt ing hernia   KNEE ARTHROSCOPY  2000   rt   TEAR DUCT PROBING Bilateral 2017   and plug placement to treat dry eye   Social History   Occupational History   Not on file  Tobacco Use   Smoking status: Former    Current packs/day: 0.00    Average packs/day: 0.5 packs/day for 20.0 years (10.0 ttl pk-yrs)    Types: Cigarettes    Start date: 03/21/1991    Quit date: 03/21/2011    Years since quitting: 12.8   Smokeless tobacco: Never  Vaping Use   Vaping status: Former   Devices: vaped for about 1 year  Substance and Sexual  Activity   Alcohol use: Yes    Alcohol/week: 0.0 standard drinks of alcohol    Comment: one a month-socially   Drug use: No   Sexual activity: Never

## 2024-02-07 ENCOUNTER — Ambulatory Visit: Admitting: Sports Medicine

## 2024-02-07 ENCOUNTER — Encounter: Payer: Self-pay | Admitting: Sports Medicine

## 2024-02-07 DIAGNOSIS — M722 Plantar fascial fibromatosis: Secondary | ICD-10-CM | POA: Diagnosis not present

## 2024-02-07 DIAGNOSIS — M7732 Calcaneal spur, left foot: Secondary | ICD-10-CM | POA: Diagnosis not present

## 2024-02-07 MED ORDER — PREDNISONE 20 MG PO TABS: 20.0000 mg | ORAL_TABLET | Freq: Every day | ORAL | 0 refills | Status: AC

## 2024-02-07 NOTE — Progress Notes (Signed)
 Christine Norman - 63 y.o. female MRN [id number]  Date of birth: [birth year]  Office Visit Note: Visit Date: 02/07/2024 PCP: Donnie Galea, MD Referred by: Donnie Galea, MD  Subjective: Chief Complaint  Patient presents with   Left Heel - Pain   HPI: Christine Norman is a very pleasant 63 y.o. female who presents today for acute on chronic left heel pain with plantar fasciitis.  Christine Norman long-term history with heel pain and plantar fasciitis, she used to get this somewhat frequently but she had been doing well for a couple years until this recent bout.  Her pain has become exacerbated and quite bothersome over the past 2 weeks.  She notes some restriction and tightness in the calf but does not have true pain that radiates there.  She has pain with first steps in the morning.  Her pain has been so bothersome recently that even light pressure of the bed mattress produces pain.  She does feel a burning/ripping sensation within the heel.  No specific injury.  She is interested in trialing shockwave therapy.  She has been using ice as well as doing some stretching and exercises.  In years past she has had a few plantar fascia injections but none over the last few years.  Lab Results  Component Value Date   HGBA1C 7.5 (H) 08/12/2023   Pertinent ROS were reviewed with the patient and found to be negative unless otherwise specified above in HPI.   Assessment & Plan: Visit Diagnoses:  1. Plantar fasciitis, left   2. Heel spur, left    Plan: Impression is acute on chronic plantar fasciitis with current exacerbation.  This is in the setting of a moderate plantar calcaneal heel spur, which seems less symptomatic on examination today.  She does report a burning/ripping sensation and there is insertional inflammation at the plantar fascial origin.  Given this, I would like to start her on a low-dose of prednisone  20 mg for 1 week only.  She is a diabetic, but well-controlled.  For the plantar fascia, we did  proceed with a trial of extracorporeal shockwave therapy.  Patient tolerated well.  I would like to proceed with about 3 treatments of these ESWT and then see what sort of cumulative benefit she has before deciding on additional treatments.  She has been doing some stretching exercises, but I would like her to do more dedicated plantar fascia rehab.  I did provide a customized handout that she may begin performing once daily on her own.  She will follow-up next week for repeat treatment and further evaluation.  Follow-up: Return for make 2 appts about 1-week apart (reg visit) for plantar fascia.   Meds & Orders:  Meds ordered this encounter  Medications   predniSONE  (DELTASONE ) 20 MG tablet    Sig: Take 1 tablet (20 mg total) by mouth daily with breakfast for 7 days.    Dispense:  7 tablet    Refill:  0   No orders of the defined types were placed in this encounter.   Procedures: Procedure: ECSWT Indications: Plantar fasciitis   Procedure Details Consent: Risks of procedure as well as the alternatives and risks of each were explained to the patient.  Verbal consent for procedure obtained. Time Out: Verified patient identification, verified procedure, site was marked, verified correct patient position. The area was cleaned with alcohol swab.     The medial band of the plantar fascia and plantar calcaneus was targeted for Extracorporeal shockwave therapy.  Preset: Plantar fasciitis Power Level: 90-100 mJ Frequency: 10-11 Hz Impulse/cycles: 2800 Head size: Regular   Patient tolerated procedure well without immediate complications.         Clinical History: No specialty comments available.  She reports that she quit smoking about 12 years ago. Her smoking use included cigarettes. She started smoking about 32 years ago. She has a 10 pack-year smoking history. She has never used smokeless tobacco.  Recent Labs    08/12/23 0836  HGBA1C 7.5*    Objective:    Physical Exam   Gen: Well-appearing, in no acute distress; non-toxic CV: Well-perfused. Warm.  Resp: Breathing unlabored on room air; no wheezing. Psych: Fluid speech in conversation; appropriate affect; normal thought process  Ortho Exam - Left foot/ankle: + TTP right at the insertion of the medial band of the plantar fascia near the calcaneus.  No specific TTP over the mid plantar calcaneus near the region of her heel spur.  There is no effusion or swelling about the ankle joint.  Dorsiflexion to approximately 95 degrees without Achilles tendon contracture.  Imaging:  *2 view x-ray of the left foot from 02/06/24 was independently reviewed and interpreted by myself today.  X-rays demonstrate a degree of pes cavus on lateral film.  There is an accessory navicular present.  There is a moderate calcaneal heel spur.  No other acute bony abnormality noted.  Past Medical/Family/Surgical/Social History: Medications & Allergies reviewed per EMR, new medications updated. 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   Knee pain 07/20/2020   Abnormal CBC 07/20/2020   Allergic reaction to insect bite 05/03/2020   Nephrolithiasis 12/05/2019   Pelvic pain 11/03/2019   LLQ pain 11/02/2019   Vaginal atrophy 11/02/2019   Routine general medical examination at a health care facility 05/20/2019   Advance care planning 05/20/2019   Back pain 05/20/2019   Diabetes mellitus without complication (HCC) 04/21/2018   Hypersomnolence 01/21/2018   Insomnia 07/20/2015   Varicose veins of both lower extremities with complications 04/07/2015   Chronic venous insufficiency 04/07/2015   History of deep vein thrombosis 04/07/2015   Bilateral carpal tunnel syndrome 03/11/2014    Numbness in both legs 03/11/2014   Hyperlipidemia    Hypothyroidism    Arthritis    Hypertension    GERD (gastroesophageal reflux disease)    Depression    Past Medical History:  Diagnosis Date   Allergy    seasonal   Arthritis    ankles and back, s/p epidural injections   Atypical ductal hyperplasia of breast 01/18/2012   Surgical Biopsy on 01/29/2012 showed a complex sclerosing lesion including sclerosing adenosis, stromal fibrosis, and cysts.    Atypical mole 07/09/2012   Right Scalpula (excised)   Back pain    Constipation    chronic- takes OTC med that helps at times and others not    Depression    Diabetes mellitus without complication (HCC)    DVT (deep venous thrombosis), right 2000   rt knee-ankle   GERD (gastroesophageal reflux disease)    IBS   History of COVID-19    Hyperlipidemia    Hypertension    not medicated   Insomnia    Psoriasis    Thyroid  disease    hypothyroidism   Family History  Problem  Relation Age of Onset   Atrial fibrillation Mother    Arthritis Mother    Diabetes Mother    Heart failure Father    Atrial fibrillation Father    Colon polyps Father    Heart disease Maternal Grandmother    Heart disease Maternal Grandfather    Colon cancer Neg Hx    Rectal cancer Neg Hx    Stomach cancer Neg Hx    Breast cancer Neg Hx    Past Surgical History:  Procedure Laterality Date   BREAST BIOPSY  01/29/2012   Procedure: BREAST BIOPSY WITH NEEDLE LOCALIZATION;  Surgeon: Darcella Earnest, MD;  Location: [facility];  Service: General;  Laterality: Left;   CHOLECYSTECTOMY  2001   lap choli   COLONOSCOPY  2006   normal   FRACTURE SURGERY  2001   orif rt fif with knee surg   HAMMER TOE SURGERY  1997   rt and lt   HERNIA REPAIR  03   rt ing hernia   KNEE ARTHROSCOPY  2000   rt   TEAR DUCT PROBING Bilateral 2017   and plug placement to treat dry eye   Social History   Occupational History   Not on file  Tobacco Use    Smoking status: Former    Current packs/day: 0.00    Average packs/day: 0.5 packs/day for 20.0 years (10.0 ttl pk-yrs)    Types: Cigarettes    Start date: 03/21/1991    Quit date: 03/21/2011    Years since quitting: 12.8   Smokeless tobacco: Never  Vaping Use   Vaping status: Former   Devices: vaped for about 1 year  Substance and Sexual Activity   Alcohol use: Yes    Alcohol/week: 0.0 standard drinks of alcohol    Comment: one a month-socially   Drug use: No   Sexual activity: Never

## 2024-02-07 NOTE — Progress Notes (Signed)
 Patient says that she has had left heel pain off and on for months that has gotten much worse recently. She has pain both in the bottom of heel and in the back of the heel. She says that sometimes the calf feels tight, but she does not get the same pain in the calf as she does in the heel. Sometimes, even the pressure of her bed mattress will make her pain worse. She does get some temporary relief with ice, and she has been doing stretches and exercises. She is here today to try shockwave therapy.  Patient was instructed in 10 minutes of therapeutic exercises for left heel to improve strength, ROM and function according to my instructions and plan of care by a Certified Athletic Trainer during the office visit. A customized handout was provided and demonstration of proper technique shown and discussed. Patient did perform exercises and demonstrate understanding through teachback.  All questions discussed and answered.

## 2024-02-08 ENCOUNTER — Other Ambulatory Visit: Payer: Self-pay | Admitting: Family Medicine

## 2024-02-08 DIAGNOSIS — G4700 Insomnia, unspecified: Secondary | ICD-10-CM

## 2024-02-10 NOTE — Telephone Encounter (Signed)
 Last office visit: 08/19/23 CPE Next office visit: nothing scheduled Last refill: TEMAZEPAM  CAP 15MG  08/15/23 180 capsules 1 refills

## 2024-02-18 ENCOUNTER — Encounter: Payer: Self-pay | Admitting: Sports Medicine

## 2024-02-18 ENCOUNTER — Ambulatory Visit (INDEPENDENT_AMBULATORY_CARE_PROVIDER_SITE_OTHER): Admitting: Sports Medicine

## 2024-02-18 DIAGNOSIS — M7732 Calcaneal spur, left foot: Secondary | ICD-10-CM | POA: Diagnosis not present

## 2024-02-18 DIAGNOSIS — M722 Plantar fascial fibromatosis: Secondary | ICD-10-CM | POA: Diagnosis not present

## 2024-02-18 NOTE — Progress Notes (Signed)
 Christine Norman - 63 y.o. female MRN [id number]  Date of birth: [birth year]  Office Visit Note: Visit Date: 02/18/2024 PCP: Cleatus Arlyss RAMAN, MD Referred by: Cleatus Arlyss RAMAN, MD  Subjective: Chief Complaint  Patient presents with   Left Heel - Follow-up    Repeat shockwave treatment. Very little discomfort afterwards. She has been doing stretches. No longer having that ripping sensation with steps. Feels like walking on rocks.    HPI: Christine Norman is a pleasant 63 y.o. female who presents today for f/u of left heel plantar fasciitis.  Zakiyah tolerated her first treatment of extracorporeal shockwave therapy last week, did not have much soreness.  She does still have some pain underneath the heel but feels like she is having less of the ripping/tearing sensation with walking.  She does feel like the home exercise stretches and plantar fascia strengthening is helping.  Did complete the oral prednisone  without issues.  Pertinent ROS were reviewed with the patient and found to be negative unless otherwise specified above in HPI.   Assessment & Plan: Visit Diagnoses:  1. Plantar fasciitis, left   2. Heel spur, left    Plan: Impression is acute on chronic plantar fasciitis with concomitant heel spur.  She did tolerate well her first treatment of extracorporeal shockwave therapy with mild improvement.  We did repeat these ESWT today, patient tolerated well. At this point we will see what sort of cumulative benefit she has after an additional shockwave treatment before deciding on additional treatment.  Discussed the avoidance of barefoot walking, need for good supportive shoe wear to support the longitudinal arch.  She may use over-the-counter anti-inflammatories only as needed.  Continue HEP on a daily basis.  Follow-up next week for repeat treatment and further eval.  Follow-up: 1-week   Meds & Orders: No orders of the defined types were placed in this encounter.  No orders of the defined types were  placed in this encounter.    Procedures: Procedure: ECSWT Indications: Plantar fasciitis   Procedure Details Consent: Risks of procedure as well as the alternatives and risks of each were explained to the patient.  Verbal consent for procedure obtained. Time Out: Verified patient identification, verified procedure, site was marked, verified correct patient position. The area was cleaned with alcohol swab.     The medial band of the plantar fascia and plantar calcaneus was targeted for Extracorporeal shockwave therapy.    Preset: Plantar fasciitis Power Level: 100 mJ Frequency: 11 Hz Impulse/cycles: 3000 Head size: Regular   Patient tolerated procedure well without immediate complications.      Clinical History: No specialty comments available.  She reports that she quit smoking about 12 years ago. Her smoking use included cigarettes. She started smoking about 32 years ago. She has a 10 pack-year smoking history. She has never used smokeless tobacco.  Recent Labs    08/12/23 0836  HGBA1C 7.5*    Objective:     Physical Exam  Gen: Well-appearing, in no acute distress; non-toxic CV: Well-perfused. Warm.  Resp: Breathing unlabored on room air; no wheezing. Psych: Fluid speech in conversation; appropriate affect; normal thought process  Ortho Exam - Left foot/ankle: + Mild TTP over the medial band of the insertional plantar fascia but improved from previous visit.  Negative calcaneal heel squeeze.  Dorsiflexion to approximately 95-100 degrees.  Imaging: No results found.  Past Medical/Family/Surgical/Social History: Medications & Allergies reviewed per EMR, new medications updated. 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    Knee pain 07/20/2020   Abnormal CBC 07/20/2020   Allergic reaction to insect bite 05/03/2020   Nephrolithiasis 12/05/2019   Pelvic pain 11/03/2019   LLQ pain 11/02/2019   Vaginal atrophy 11/02/2019   Routine general medical examination at a health care facility 05/20/2019   Advance care planning 05/20/2019   Back pain 05/20/2019   Diabetes mellitus without complication (HCC) 04/21/2018   Hypersomnolence 01/21/2018   Insomnia 07/20/2015   Varicose veins of both lower extremities with complications 04/07/2015   Chronic venous insufficiency 04/07/2015   History of deep vein thrombosis 04/07/2015   Bilateral carpal tunnel syndrome 03/11/2014   Numbness in both legs 03/11/2014   Hyperlipidemia    Hypothyroidism    Arthritis    Hypertension    GERD (gastroesophageal reflux disease)    Depression    Past Medical History:  Diagnosis Date   Allergy    seasonal   Arthritis    ankles and back, s/p epidural injections   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    Atypical mole 07/09/2012   Right Scalpula (excised)   Back pain    Constipation    chronic- takes OTC med that helps at times and others not    Depression    Diabetes mellitus without complication (HCC)    DVT (deep venous thrombosis), right 2000   rt knee-ankle   GERD (gastroesophageal reflux disease)    IBS   History of COVID-19    Hyperlipidemia    Hypertension    not medicated   Insomnia    Psoriasis    Thyroid  disease    hypothyroidism   Family History  Problem Relation Age of Onset   Atrial fibrillation Mother    Arthritis Mother    Diabetes Mother    Heart failure Father    Atrial fibrillation Father    Colon polyps Father    Heart disease Maternal Grandmother    Heart disease Maternal Grandfather    Colon cancer Neg Hx    Rectal cancer Neg Hx    Stomach cancer  Neg Hx    Breast cancer Neg Hx    Past Surgical History:  Procedure Laterality Date   BREAST BIOPSY  01/29/2012   Procedure: BREAST BIOPSY WITH NEEDLE LOCALIZATION;  Surgeon: Sherlean JINNY Laughter, MD;  Location: [facility];  Service: General;  Laterality: Left;   CHOLECYSTECTOMY  2001   lap choli   COLONOSCOPY  2006   normal   FRACTURE SURGERY  2001   orif rt fif with knee surg   HAMMER TOE SURGERY  1997   rt and lt   HERNIA REPAIR  03   rt ing hernia   KNEE ARTHROSCOPY  2000   rt   TEAR DUCT PROBING Bilateral 2017   and plug placement to treat dry eye   Social History   Occupational History   Not on file  Tobacco Use   Smoking status: Former    Current packs/day: 0.00    Average packs/day: 0.5 packs/day for 20.0 years (10.0 ttl pk-yrs)    Types: Cigarettes    Start date: 03/21/1991    Quit date: 03/21/2011  Years since quitting: 12.9   Smokeless tobacco: Never  Vaping Use   Vaping status: Former   Devices: vaped for about 1 year  Substance and Sexual Activity   Alcohol use: Yes    Alcohol/week: 0.0 standard drinks of alcohol    Comment: one a month-socially   Drug use: No   Sexual activity: Never

## 2024-02-26 ENCOUNTER — Ambulatory Visit: Admitting: Sports Medicine

## 2024-02-26 ENCOUNTER — Encounter: Payer: Self-pay | Admitting: Sports Medicine

## 2024-02-26 DIAGNOSIS — M722 Plantar fascial fibromatosis: Secondary | ICD-10-CM | POA: Diagnosis not present

## 2024-02-26 DIAGNOSIS — M6702 Short Achilles tendon (acquired), left ankle: Secondary | ICD-10-CM

## 2024-02-26 DIAGNOSIS — M7732 Calcaneal spur, left foot: Secondary | ICD-10-CM | POA: Diagnosis not present

## 2024-02-26 NOTE — Progress Notes (Signed)
 Patient says that she felt relief right after the first shockwave therapy, but after the second treatment last week she had significant pain for the remainder of that day and the next two days. She says that by Friday, she began to have some relief, and over the weekend it felt more tolerable. She has continued to do her exercises which feel okay, and do seem to help the rest of her foot and her calf, although she has not noticed much difference in the heel itself. She says that the towel scrunches were initially uncomfortable, although that discomfort has improved; she says that the towel crunches do still feel weaker on the left side compared to the unaffected right side.

## 2024-02-26 NOTE — Progress Notes (Signed)
 Christine Norman - 63 y.o. female MRN [id number]  Date of birth: 02-01-1961  Office Visit Note: Visit Date: 02/26/2024 PCP: Cleatus Arlyss RAMAN, MD Referred by: Cleatus Arlyss RAMAN, MD  Subjective: Chief Complaint  Patient presents with   Left Heel - Follow-up   HPI: Christine Norman is a pleasant 63 y.o. female who presents today for follow-up of chronic left heel pain with plantar fasciitis and concomitant heel spur.  She did note good improvement after our first treatment of extracorporeal shockwave therapy, had more soreness and pain for a few days following our second treatment.  She started to notice relief by Friday and over the weekend and is more tolerable but still causing her pain and limitation with her ADLs.  She has been performing her home stretching and rehab exercises and feeling improvements and less restriction in the calf and the arch of the foot, but continues with pain directly over the heel/plantar fascia.  She does use oral Voltaren 50 mg daily for her back and other arthritic related pains.  Pertinent ROS were reviewed with the patient and found to be negative unless otherwise specified above in HPI.   Assessment & Plan: Visit Diagnoses:  1. Plantar fasciitis, left   2. Heel spur, left   3. Achilles tendon contracture, left    Plan: Impression is chronic left heel pain with active plantar fasciitis as well as concomitant heel spur.  Her Achilles tendon contracture is contributing to the stress placed on the plantar fascia but is improving with less soft tissue restriction and ankle motion with her home exercises and stretches.  She did have mild relief from extracorporeal shockwave therapy, but is not progressing quite as quickly as hopeful.  Given her antalgic gait, we discussed placing her into a short cam walker boot only for 2-3 days with shockwave treatment and then may transition out of this.  We did repeat shockwave treatment today and she will go into the boot for the next 2-3  days before returning to regular shoewear.  We will see her back in about 1 week and repeat shockwave and this short boot shut down 1 additional time and then see what sort of cumulative benefit she is having.  If she is making good improvements in terms of pain and function, we may continue.  If not, we did discuss a one-time ultrasound-guided plantar fascia/heel spur injection.  Patient is agreeable to this plan.  She will continue oral Voltaren 50 mg daily in the interim. Continue HEP.  Follow-up: Return in about 1 week (around 03/04/2024) for Left foot/PF.   Meds & Orders: No orders of the defined types were placed in this encounter.  No orders of the defined types were placed in this encounter.    Procedures: Procedure: ECSWT Indications: Plantar fasciitis   Procedure Details Consent: Risks of procedure as well as the alternatives and risks of each were explained to the patient.  Verbal consent for procedure obtained. Time Out: Verified patient identification, verified procedure, site was marked, verified correct patient position. The area was cleaned with alcohol swab.     The medial band of the plantar fascia and plantar calcaneus was targeted for Extracorporeal shockwave therapy.    Preset: Plantar fasciitis Power Level: 90 mJ Frequency: 10-11 Hz Impulse/cycles: 2600 Head size: Regular   Patient tolerated procedure well without immediate complications.       Clinical History: No specialty comments available.  She reports that she quit smoking about 12 years ago. Her  smoking use included cigarettes. She started smoking about 32 years ago. She has a 10 pack-year smoking history. She has never used smokeless tobacco.  Recent Labs    08/12/23 0836  HGBA1C 7.5*    Objective:    Physical Exam  Gen: Well-appearing, in no acute distress; non-toxic CV: Well-perfused. Warm.  Resp: Breathing unlabored on room air; no wheezing. Psych: Fluid speech in conversation; appropriate  affect; normal thought process  Ortho Exam - Left foot: + TTP over the medial band of the insertional plantar fascia.  Negative calcaneal heel squeeze.  There is also tenderness at the base of the plantar calcaneus near her heel spur.  There is improved restriction in the gastroc and calf but still a degree of Achilles tendon contracture but dorsiflexion is improved from previous visits.  Imaging: No results found.  Past Medical/Family/Surgical/Social History: Medications & Allergies reviewed per EMR, new medications updated. 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   Knee pain 07/20/2020   Abnormal CBC 07/20/2020   Allergic reaction to insect bite 05/03/2020   Nephrolithiasis 12/05/2019   Pelvic pain 11/03/2019   LLQ pain 11/02/2019   Vaginal atrophy 11/02/2019   Routine general medical examination at a health care facility 05/20/2019   Advance care planning 05/20/2019   Back pain 05/20/2019   Diabetes mellitus without complication (HCC) 04/21/2018   Hypersomnolence 01/21/2018   Insomnia 07/20/2015   Varicose veins of both lower extremities with complications 04/07/2015   Chronic venous insufficiency 04/07/2015   History of deep vein thrombosis 04/07/2015   Bilateral carpal tunnel syndrome 03/11/2014   Numbness in both legs 03/11/2014   Hyperlipidemia    Hypothyroidism    Arthritis    Hypertension    GERD (gastroesophageal reflux disease)    Depression    Past Medical History:  Diagnosis Date   Allergy    seasonal   Arthritis    ankles and back, s/p epidural injections   Atypical ductal hyperplasia of breast 01/18/2012   Surgical Biopsy on 01/29/2012 showed a complex sclerosing lesion including sclerosing adenosis, stromal  fibrosis, and cysts.    Atypical mole 07/09/2012   Right Scalpula (excised)   Back pain    Constipation    chronic- takes OTC med that helps at times and others not    Depression    Diabetes mellitus without complication (HCC)    DVT (deep venous thrombosis), right 2000   rt knee-ankle   GERD (gastroesophageal reflux disease)    IBS   History of COVID-19    Hyperlipidemia    Hypertension    not medicated   Insomnia    Psoriasis    Thyroid  disease    hypothyroidism   Family History  Problem Relation Age of Onset   Atrial fibrillation Mother    Arthritis Mother    Diabetes Mother    Heart failure Father    Atrial fibrillation Father    Colon polyps Father    Heart disease Maternal Grandmother    Heart disease Maternal Grandfather    Colon cancer Neg Hx    Rectal cancer Neg Hx    Stomach cancer Neg Hx    Breast cancer Neg Hx    Past Surgical History:  Procedure Laterality Date   BREAST BIOPSY  01/29/2012  Procedure: BREAST BIOPSY WITH NEEDLE LOCALIZATION;  Surgeon: Sherlean JINNY Laughter, MD;  Location: [facility];  Service: General;  Laterality: Left;   CHOLECYSTECTOMY  2001   lap choli   COLONOSCOPY  2006   normal   FRACTURE SURGERY  2001   orif rt fif with knee surg   HAMMER TOE SURGERY  1997   rt and lt   HERNIA REPAIR  03   rt ing hernia   KNEE ARTHROSCOPY  2000   rt   TEAR DUCT PROBING Bilateral 2017   and plug placement to treat dry eye   Social History   Occupational History   Not on file  Tobacco Use   Smoking status: Former    Current packs/day: 0.00    Average packs/day: 0.5 packs/day for 20.0 years (10.0 ttl pk-yrs)    Types: Cigarettes    Start date: 03/21/1991    Quit date: 03/21/2011    Years since quitting: 12.9   Smokeless tobacco: Never  Vaping Use   Vaping status: Former   Devices: vaped for about 1 year  Substance and Sexual Activity   Alcohol use: Yes    Alcohol/week: 0.0 standard drinks of alcohol    Comment: one a  month-socially   Drug use: No   Sexual activity: Never

## 2024-03-05 ENCOUNTER — Ambulatory Visit (INDEPENDENT_AMBULATORY_CARE_PROVIDER_SITE_OTHER): Admitting: Sports Medicine

## 2024-03-05 ENCOUNTER — Encounter: Payer: Self-pay | Admitting: Sports Medicine

## 2024-03-05 DIAGNOSIS — M722 Plantar fascial fibromatosis: Secondary | ICD-10-CM

## 2024-03-05 DIAGNOSIS — M7732 Calcaneal spur, left foot: Secondary | ICD-10-CM

## 2024-03-05 NOTE — Progress Notes (Signed)
 Christine Norman - 63 y.o. female MRN [id number]  Date of birth: [birth year]  Office Visit Note: Visit Date: 03/05/2024 PCP: Cleatus Arlyss RAMAN, MD Referred by: Cleatus Arlyss RAMAN, MD  Subjective: Chief Complaint  Patient presents with   Left Heel - Follow-up   HPI: Christine Norman is a pleasant 63 y.o. female who presents today for follow-up of chronic left heel pain with plantar fasciitis and concomitant heel spur.  We have performed 3 sessions of extracorporeal shockwave therapy, this last time we decided to treat with concomitant short-term shutdown in a cam walker boot for 2 days following treatment.  She does continue her home exercise stretches and plantar fascia strengthening.  Currently, she feels she is about 30% improved.  Using oral diclofenac 50 mg daily.  Pertinent ROS were reviewed with the patient and found to be negative unless otherwise specified above in HPI.   Assessment & Plan: Visit Diagnoses:  1. Plantar fasciitis, left   2. Heel spur, left    Plan: Impression is chronic left heel pain with active plantar fasciitis and a calcaneal heel spur.  She has been making some improvements with extracorporeal shockwave therapy, although is still somewhat slow to full recovery.  We did repeat 1 additional extracorporeal shockwave treatment today.  We will continue our more recent regimen of placing her in a short cam walker boot for the next 2-3 days status posttreatment.  I would like to give her a little bit more time to see then if she is continuing to progress and improve or if she is plateauing.  We will follow-up over the next 2-3 weeks and reevaluate.  If she feels she is about 50% better or more, we will likely continue shockwave treatment.  If she feels she has not improved much, next step would be considering ultrasound-guided plantar fascia injection.  We will follow-up in a few weeks and reevaluate.  She will continue her oral Voltaren 50 mg daily and her home exercises.  Follow-up:  Return in about 18 days (around 03/23/2024) for PF f/u  Meds & Orders: No orders of the defined types were placed in this encounter.  No orders of the defined types were placed in this encounter.    Procedures: Procedure: ECSWT Indications: Plantar fasciitis   Procedure Details Consent: Risks of procedure as well as the alternatives and risks of each were explained to the patient.  Verbal consent for procedure obtained. Time Out: Verified patient identification, verified procedure, site was marked, verified correct patient position. The area was cleaned with alcohol swab.     The medial band of the plantar fascia and plantar calcaneus was targeted for Extracorporeal shockwave therapy.    Preset: Plantar fasciitis Power Level: 100 mJ  Frequency: 11-12 Hz Impulse/cycles: 2800 Head size: Regular   Patient tolerated procedure well without immediate complications.      Clinical History: No specialty comments available.  She reports that she quit smoking about 12 years ago. Her smoking use included cigarettes. She started smoking about 32 years ago. She has a 10 pack-year smoking history. She has never used smokeless tobacco.  Recent Labs    08/12/23 0836  HGBA1C 7.5*    Objective:    Physical Exam  Gen: Well-appearing, in no acute distress; non-toxic CV: Well-perfused. Warm.  Resp: Breathing unlabored on room air; no wheezing. Psych: Fluid speech in conversation; appropriate affect; normal thought process  Ortho Exam - Left foot/ankle: Mild TTP over the medial band of the plantar fascia.  No redness swelling or effusion.  Imaging: No results found.  Past Medical/Family/Surgical/Social History: Medications & Allergies reviewed per EMR, new medications updated. 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   Knee pain 07/20/2020   Abnormal CBC 07/20/2020   Allergic reaction to insect bite 05/03/2020   Nephrolithiasis 12/05/2019   Pelvic pain 11/03/2019   LLQ pain 11/02/2019   Vaginal atrophy 11/02/2019   Routine general medical examination at a health care facility 05/20/2019   Advance care planning 05/20/2019   Back pain 05/20/2019   Diabetes mellitus without complication (HCC) 04/21/2018   Hypersomnolence 01/21/2018   Insomnia 07/20/2015   Varicose veins of both lower extremities with complications 04/07/2015   Chronic venous insufficiency 04/07/2015   History of deep vein thrombosis 04/07/2015   Bilateral carpal tunnel syndrome 03/11/2014   Numbness in both legs 03/11/2014   Hyperlipidemia    Hypothyroidism    Arthritis    Hypertension    GERD (gastroesophageal reflux disease)    Depression    Past Medical History:  Diagnosis Date   Allergy    seasonal   Arthritis    ankles and back, s/p epidural injections   Atypical ductal hyperplasia of breast 01/18/2012   Surgical Biopsy on 01/29/2012 showed a complex sclerosing lesion including sclerosing adenosis, stromal fibrosis, and cysts.    Atypical mole 07/09/2012   Right Scalpula (excised)   Back pain    Constipation    chronic- takes OTC med that helps at times and others not    Depression    Diabetes mellitus without complication (HCC)    DVT (deep venous thrombosis), right 2000   rt knee-ankle   GERD (gastroesophageal reflux disease)    IBS   History of COVID-19    Hyperlipidemia    Hypertension    not medicated   Insomnia    Psoriasis    Thyroid  disease    hypothyroidism   Family History  Problem Relation Age of Onset   Atrial fibrillation Mother    Arthritis Mother    Diabetes Mother    Heart failure Father    Atrial fibrillation Father    Colon polyps Father    Heart disease Maternal Grandmother    Heart disease  Maternal Grandfather    Colon cancer Neg Hx    Rectal cancer Neg Hx    Stomach cancer Neg Hx    Breast cancer Neg Hx    Past Surgical History:  Procedure Laterality Date   BREAST BIOPSY  01/29/2012   Procedure: BREAST BIOPSY WITH NEEDLE LOCALIZATION;  Surgeon: Sherlean JINNY Laughter, MD;  Location: [facility];  Service: General;  Laterality: Left;   CHOLECYSTECTOMY  2001   lap choli   COLONOSCOPY  2006   normal   FRACTURE SURGERY  2001   orif rt fif with knee surg   HAMMER TOE SURGERY  1997   rt and lt   HERNIA REPAIR  03   rt ing hernia   KNEE ARTHROSCOPY  2000   rt   TEAR DUCT PROBING Bilateral 2017   and plug placement to treat dry eye   Social History   Occupational History   Not on file  Tobacco Use   Smoking status: Former    Current packs/day: 0.00  Average packs/day: 0.5 packs/day for 20.0 years (10.0 ttl pk-yrs)    Types: Cigarettes    Start date: 03/21/1991    Quit date: 03/21/2011    Years since quitting: 12.9   Smokeless tobacco: Never  Vaping Use   Vaping status: Former   Devices: vaped for about 1 year  Substance and Sexual Activity   Alcohol use: Yes    Alcohol/week: 0.0 standard drinks of alcohol    Comment: one a month-socially   Drug use: No   Sexual activity: Never

## 2024-03-05 NOTE — Progress Notes (Signed)
 Patient says that she notice improvements since wearing the boot for about 3 days - about 30% overall. She says that she did go to [employer] and that flared up her pain, so she wore the boot again for the rest of that day only, which did help. She says that she may want to try an injection, as she has not had significant improvements, and she does have some big events coming up in the fall where she would like to be feeling better.

## 2024-03-11 ENCOUNTER — Telehealth: Payer: Self-pay | Admitting: Sports Medicine

## 2024-03-11 NOTE — Telephone Encounter (Signed)
 Patient called and said her left bottom foot isn't getting better and wanted to know if she should just get an injection or not. [phone]

## 2024-03-23 ENCOUNTER — Encounter: Payer: Self-pay | Admitting: Sports Medicine

## 2024-03-23 ENCOUNTER — Ambulatory Visit (INDEPENDENT_AMBULATORY_CARE_PROVIDER_SITE_OTHER): Admitting: Sports Medicine

## 2024-03-23 ENCOUNTER — Other Ambulatory Visit: Payer: Self-pay

## 2024-03-23 DIAGNOSIS — M722 Plantar fascial fibromatosis: Secondary | ICD-10-CM

## 2024-03-23 DIAGNOSIS — M7732 Calcaneal spur, left foot: Secondary | ICD-10-CM

## 2024-03-23 MED ORDER — BETAMETHASONE SOD PHOS & ACET 6 (3-3) MG/ML IJ SUSP
6.0000 mg | INTRAMUSCULAR | Status: AC | PRN
  Administered 2024-03-23: 6 mg via INTRA_ARTICULAR

## 2024-03-23 MED ORDER — LIDOCAINE HCL 1 % IJ SOLN
1.0000 mL | INTRAMUSCULAR | Status: AC | PRN
  Administered 2024-03-23: 1 mL

## 2024-03-23 MED ORDER — BUPIVACAINE HCL 0.25 % IJ SOLN
1.0000 mL | INTRAMUSCULAR | Status: AC | PRN
  Administered 2024-03-23: 1 mL

## 2024-03-23 NOTE — Progress Notes (Signed)
 Christine Norman - 63 y.o. female MRN [id number]  Date of birth: [birth year]  Office Visit Note: Visit Date: 03/23/2024 PCP: Cleatus Arlyss RAMAN, MD Referred by: Cleatus Arlyss RAMAN, MD  Subjective: Chief Complaint  Patient presents with   Left Heel - Follow-up   HPI: Christine Norman is a very pleasant 63 y.o. female who presents today for follow-up of chronic left heel pain with plantar fasciitis.  We have performed 4 treatments of extracorporeal shockwave therapy for her plantar fasciitis and her concomitant heel spur.  Since we last talked, she has certainly found improvement but her progress is somewhat slow.  She does feel she is at least 30%, maybe up to 40% improved from her previous treatments.  She was able to go to the beach with her family without significant pain.  She however does feel the pain/discomfort on a daily basis.  Continues using oral diclofenac 50 mg daily for overall joint pains.  Pertinent ROS were reviewed with the patient and found to be negative unless otherwise specified above in HPI.   Assessment & Plan: Visit Diagnoses:  1. Plantar fasciitis, left   2. Heel spur, left    Plan: Impression is chronic left heel pain with plantar fasciitis and a concomitant heel spur which has been improving with extracorporeal shockwave therapy although progress has been somewhat slow.  We decided to move forward with ultrasound-guided plantar fascia injection today, patient tolerated well.  Advised on postinjection protocol.  I would like her to return to her short cam walker boot only for the next 2 days and then may progress out of this.  By the end of the week she may get back into her home exercise regimen on a daily basis.  Okay to continue her oral Voltaren 50 mg daily.  She will send me an update in about 2 weeks to see the degree of her progress.  Hopefully she is significantly improved, if not we could always consider a few additional shockwave treatments at least 3 weeks from today's  injection.  Follow-up: Return for Can update me in Mychart x 2 weeks .   Meds & Orders: No orders of the defined types were placed in this encounter.   Orders Placed This Encounter  Procedures   Foot Inj   US  Guided Needle Placement - No Linked Charges     Procedures: Foot Inj: left plantar fascia  Date/Time: 03/23/2024 9:44 AM  Performed by: Burnetta Brunet, DO Authorized by: Burnetta Brunet, DO   Consent Given by:  Patient Site marked: the procedure site was marked   Timeout: prior to procedure the correct patient, procedure, and site was verified   Indications:  Fasciitis and pain Condition: Plantar Fasciitis   Location: left plantar fascia muscle   Prep: patient was prepped and draped in usual sterile fashion   Needle Size:  22 G Medications:  1 mL lidocaine  1 %; 1 mL bupivacaine  0.25 %; 6 mg betamethasone  acetate-betamethasone  sodium phosphate  6 (3-3) MG/ML Patient Tolerance:  Patient tolerated the procedure well with no immediate complications  Procedure: Plantar fasciitis injection, Left After discussion on R/B/I and informed verbal consent was obtained, a timeout was performed. Patient was lying prone on exam table. Medial aspect of inferior calcaneus was identified with point of maximum tenderness and area was cleaned with betadine and alcohol swab. Then utilizing ultrasound guidance via an in-plane approach, the patient's plantar fasica of left foot was injected with 1:1:1 lidocaine :bupivicaine:celestone  with multiple needle fenestrations from a medial to  lateral in-plane approach. Patient tolerated procedure well without immediate complications.          Clinical History: No specialty comments available.  She reports that she quit smoking about 13 years ago. Her smoking use included cigarettes. She started smoking about 33 years ago. She has a 10 pack-year smoking history. She has never used smokeless tobacco.  Recent Labs    08/12/23 0836  HGBA1C 7.5*    Objective:     Physical Exam  Gen: Well-appearing, in no acute distress; non-toxic CV: Well-perfused. Warm.  Resp: Breathing unlabored on room air; no wheezing. Psych: Fluid speech in conversation; appropriate affect; normal thought process  Ortho Exam - Left foot/heel: + TTP over the medial band of the insertional and proximal aspect of the plantar fascia.  Negative calcaneal heel squeeze.  There is improved dorsiflexion from previous exams.  No effusion about the ankle joint.  Imaging: No results found.  Past Medical/Family/Surgical/Social History: Medications & Allergies reviewed per EMR, new medications updated. 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   Knee pain 07/20/2020   Abnormal CBC 07/20/2020   Allergic reaction to insect bite 05/03/2020   Nephrolithiasis 12/05/2019   Pelvic pain 11/03/2019   LLQ pain 11/02/2019   Vaginal atrophy 11/02/2019   Routine general medical examination at a health care facility 05/20/2019   Advance care planning 05/20/2019   Back pain 05/20/2019   Diabetes mellitus without complication (HCC) 04/21/2018   Hypersomnolence 01/21/2018   Insomnia 07/20/2015   Varicose veins of both lower extremities with complications 04/07/2015   Chronic venous insufficiency 04/07/2015   History of deep vein thrombosis 04/07/2015   Bilateral carpal tunnel syndrome 03/11/2014   Numbness in both legs 03/11/2014   Hyperlipidemia    Hypothyroidism    Arthritis    Hypertension    GERD (gastroesophageal reflux disease)    Depression    Past Medical History:  Diagnosis Date   Allergy    seasonal   Arthritis    ankles and back, s/p epidural injections   Atypical ductal hyperplasia of breast 01/18/2012   Surgical Biopsy  on 01/29/2012 showed a complex sclerosing lesion including sclerosing adenosis, stromal fibrosis, and cysts.    Atypical mole 07/09/2012   Right Scalpula (excised)   Back pain    Constipation    chronic- takes OTC med that helps at times and others not    Depression    Diabetes mellitus without complication (HCC)    DVT (deep venous thrombosis), right 2000   rt knee-ankle   GERD (gastroesophageal reflux disease)    IBS   History of COVID-19    Hyperlipidemia    Hypertension    not medicated   Insomnia    Psoriasis    Thyroid  disease    hypothyroidism   Family History  Problem Relation Age of Onset   Atrial fibrillation Mother    Arthritis Mother    Diabetes Mother    Heart failure Father    Atrial fibrillation Father    Colon polyps Father    Heart disease Maternal Grandmother    Heart disease Maternal Grandfather    Colon cancer Neg Hx    Rectal cancer Neg Hx    Stomach cancer Neg Hx    Breast cancer Neg Hx  Past Surgical History:  Procedure Laterality Date   BREAST BIOPSY  01/29/2012   Procedure: BREAST BIOPSY WITH NEEDLE LOCALIZATION;  Surgeon: Sherlean JINNY Laughter, MD;  Location: [facility];  Service: General;  Laterality: Left;   CHOLECYSTECTOMY  2001   lap choli   COLONOSCOPY  2006   normal   FRACTURE SURGERY  2001   orif rt fif with knee surg   HAMMER TOE SURGERY  1997   rt and lt   HERNIA REPAIR  03   rt ing hernia   KNEE ARTHROSCOPY  2000   rt   TEAR DUCT PROBING Bilateral 2017   and plug placement to treat dry eye   Social History   Occupational History   Not on file  Tobacco Use   Smoking status: Former    Current packs/day: 0.00    Average packs/day: 0.5 packs/day for 20.0 years (10.0 ttl pk-yrs)    Types: Cigarettes    Start date: 03/21/1991    Quit date: 03/21/2011    Years since quitting: 13.0   Smokeless tobacco: Never  Vaping Use   Vaping status: Former   Devices: vaped for about 1 year  Substance and Sexual Activity    Alcohol use: Yes    Alcohol/week: 0.0 standard drinks of alcohol    Comment: one a month-socially   Drug use: No   Sexual activity: Never

## 2024-03-23 NOTE — Progress Notes (Signed)
 Patient says that she did not notice much relief the first week after her the last shockwave therapy, but in the week after that did begin to notice some improvements. She says that she is overall about 30% better, maybe more, but does not feel she is quite at 50%. She says that she was able to walk on the beach without pain, which is the first time she has been able to do so all summer. She also mentioning having gotten [place] sneakers, but finds that her sandals typically feel better on her heel than the sneakers do. She is open to injection today if recommended, or open to more shockwave therapy if that is recommended over injection.

## 2024-05-04 ENCOUNTER — Other Ambulatory Visit: Payer: Self-pay | Admitting: Family Medicine

## 2024-05-04 DIAGNOSIS — G4700 Insomnia, unspecified: Secondary | ICD-10-CM

## 2024-05-05 NOTE — Telephone Encounter (Signed)
 Rx sent. Please see about getting patient set for DM2 f/u.  We can do labs at the visit.  Thanks.

## 2024-05-21 ENCOUNTER — Telehealth: Payer: Self-pay | Admitting: Physician Assistant

## 2024-05-21 ENCOUNTER — Other Ambulatory Visit: Payer: Self-pay

## 2024-05-21 DIAGNOSIS — M1712 Unilateral primary osteoarthritis, left knee: Secondary | ICD-10-CM

## 2024-05-21 DIAGNOSIS — M1711 Unilateral primary osteoarthritis, right knee: Secondary | ICD-10-CM

## 2024-05-21 NOTE — Telephone Encounter (Signed)
 Talked with patient concerning gel injection.  Patient would like to have SynviscOne for her next set of gel injections.  I did advise patient that we no longer use Synvisc/SynviscOne due to it causing skin Necrosis.   Patient prefers to proceed with this brand due to previous issues with Monovisc.  Insurance has been contacted for benefits

## 2024-05-21 NOTE — Telephone Encounter (Signed)
 Pt called asking for a call back from April J. Pt has a few questions about gel injections . Please call pt about this matter at [phone].

## 2024-06-08 ENCOUNTER — Ambulatory Visit: Admitting: Physician Assistant

## 2024-06-08 DIAGNOSIS — M1711 Unilateral primary osteoarthritis, right knee: Secondary | ICD-10-CM

## 2024-06-08 DIAGNOSIS — M170 Bilateral primary osteoarthritis of knee: Secondary | ICD-10-CM | POA: Diagnosis not present

## 2024-06-08 DIAGNOSIS — M1712 Unilateral primary osteoarthritis, left knee: Secondary | ICD-10-CM

## 2024-06-08 MED ORDER — LIDOCAINE HCL 1 % IJ SOLN
3.0000 mL | INTRAMUSCULAR | Status: AC | PRN
  Administered 2024-06-08: 3 mL

## 2024-06-08 MED ORDER — HYLAN G-F 20 48 MG/6ML IX SOSY
48.0000 mg | PREFILLED_SYRINGE | INTRA_ARTICULAR | Status: AC | PRN
  Administered 2024-06-08: 48 mg via INTRA_ARTICULAR

## 2024-06-08 NOTE — Progress Notes (Signed)
   Procedure Note  Patient: Christine Norman             Date of Birth: [birth year]           MRN: [id number]             Visit Date: 06/08/2024 HPI: Mrs. Bourbeau comes in today for scheduled Synvisc injections both knees.  She states the last injections helped well but she had significant pain for that a few days after the injections.  She has had no new injury to either knee.  She has known osteoarthritis both knees.  No scheduled surgery in the next 6 months.  She has tried cortisone injections in the past with mixed results.  She has had hyaluronic acid injections both knees in the past with good results.  Physical exam: General Well-developed well-nourished female no acute distress. Bilateral knees: Good range of motion.  No abnormal warmth erythema or effusion.  Procedures: Visit Diagnoses:  1. Primary osteoarthritis of right knee   2. Primary osteoarthritis of left knee     Large Joint Inj: bilateral knee on 06/08/2024 10:00 AM Indications: pain Details: 22 G 1.5 in needle, anterolateral approach  Arthrogram: No  Medications (Right): 3 mL lidocaine  1 %; 48 mg Hylan G-F 20 48 MG/6ML Medications (Left): 3 mL lidocaine  1 %; 48 mg Hylan G-F 20 48 MG/6ML Outcome: tolerated well, no immediate complications Procedure, treatment alternatives, risks and benefits explained, specific risks discussed. Consent was given by the patient. Immediately prior to procedure a time out was called to verify the correct patient, procedure, equipment, support staff and site/side marked as required. Patient was prepped and draped in the usual sterile fashion.      Plan: She will follow-up with us  as needed knows to wait at least 6 months between viscosupplementation injections.  Questions were encouraged and answered at length

## 2024-06-22 ENCOUNTER — Encounter: Payer: Self-pay | Admitting: Radiology

## 2024-06-26 ENCOUNTER — Encounter: Payer: Self-pay | Admitting: Family Medicine

## 2024-06-26 ENCOUNTER — Ambulatory Visit: Admitting: Family Medicine

## 2024-06-26 ENCOUNTER — Ambulatory Visit: Payer: Self-pay | Admitting: *Deleted

## 2024-06-26 VITALS — BP 120/74 | HR 94 | Temp 100.0°F | Resp 18 | Ht 63.0 in | Wt 197.0 lb

## 2024-06-26 DIAGNOSIS — J101 Influenza due to other identified influenza virus with other respiratory manifestations: Secondary | ICD-10-CM | POA: Diagnosis not present

## 2024-06-26 DIAGNOSIS — J029 Acute pharyngitis, unspecified: Secondary | ICD-10-CM

## 2024-06-26 DIAGNOSIS — R509 Fever, unspecified: Secondary | ICD-10-CM | POA: Diagnosis not present

## 2024-06-26 LAB — POC COVID19/FLU A&B COMBO
Covid Antigen, POC: NEGATIVE
Influenza A Antigen, POC: POSITIVE — AB
Influenza B Antigen, POC: NEGATIVE

## 2024-06-26 LAB — POCT RAPID STREP A (OFFICE): Rapid Strep A Screen: NEGATIVE

## 2024-06-26 MED ORDER — OSELTAMIVIR PHOSPHATE 75 MG PO CAPS: 75.0000 mg | ORAL_CAPSULE | Freq: Two times a day (BID) | ORAL | 0 refills | Status: DC

## 2024-06-26 NOTE — Telephone Encounter (Signed)
 Thank you for getting patient scheduled.

## 2024-06-26 NOTE — Telephone Encounter (Signed)
 FYI Only or Action Required?: FYI only for provider: appointment scheduled on today with other provider at [facility].  Patient was last seen in primary care on 08/19/2023 by Cleatus Arlyss RAMAN, MD.  Called Nurse Triage reporting Fever.  Symptoms began yesterday.  Interventions attempted: OTC medications: ibuprofen.  Symptoms are: gradually worsening.  Triage Disposition: See HCP Within 4 Hours (Or PCP Triage)  Patient/caregiver understands and will follow disposition?: Yes   No available appt with PCP other other providers in practice today . CAL assisted with scheduling acute/ same day appt at [facility]. Patient was only wanting to see PCP.              Copied from CRM [id number]. Topic: Clinical - Red Word Triage >> Jun 26, 2024  8:24 AM Charlet HERO wrote: Red Word that prompted transfer to Nurse Triage: patient is calling with fever of 101.5, she is stating that she is very weak and sore all over. She has a slight cough and her throat feels raw and has pressure in her ears. She took a covid test this morning and it was negative. He husband was diagnosed with Bronchitis. Dr Cleatus Reason for Disposition  [1] Fever > 101 F (38.3 C) AND [2] age > 60 years  Answer Assessment - Initial Assessment Questions Appt scheduled today at other practice -[facility] today . No chest pain no SOB.        1. TEMPERATURE: What is the most recent temperature?  How was it measured?      101.5 2. ONSET: When did the fever start?      Yesterday 100.5 3. CHILLS: Do you have chills? If yes: How bad are they?  (e.g., none, mild, moderate, severe)     Chills last night but not now  4. OTHER SYMPTOMS: Do you have any other symptoms besides the fever?  (e.g., abdomen pain, cough, diarrhea, earache, headache, sore throat, urination pain)     Dry cough, sore throat, head pressure and pressure in ears, body aches, weakness. No chest pain no SOB 5. CAUSE: If there are no symptoms, ask: What do you think  is causing the fever?      Husband has bronchitits  6. CONTACTS: Does anyone else in the family have an infection?     Yes 7. TREATMENT: What have you done so far to treat this fever? (e.g., OTC fever medicines)     Ibuprofen  8. IMMUNOCOMPROMISE: Do you have any of the following: diabetes, HIV positive, splenectomy, cancer chemotherapy, chronic steroid treatment, transplant patient, etc.?    DM  9. PREGNANCY: Is there any chance you are pregnant? When was your last menstrual period?     na 10. TRAVEL: Have you traveled out of the country in the last month? (e.g., travel history, exposures)       na  Protocols used: [facility]

## 2024-06-26 NOTE — Progress Notes (Signed)
 Name: Christine Norman   MRN: [id number]    DOB: Jul 01, 1961   Date:06/26/2024       Progress Note  Subjective  Chief Complaint  Chief Complaint  Patient presents with   Ear Pain   Cough    Sx started Tuesday, fever kicked in yesterday   Fever   Sore Throat    Discussed the use of AI scribe software for clinical note transcription with the patient, who gave verbal consent to proceed.  History of Present Illness Christine Norman is a 63 year old female who presents with symptoms of influenza A.  She began feeling sick on Tuesday with throat drainage and a mild cough. By Wednesday night, she developed a fever and chills. Today is Friday, marking the fourth day of her illness.  She experiences persistent body aches, fatigue, fever, stabbing ear pain, sore throat, and a productive cough with colored mucus. Her throat pain is significant when coughing.  Her husband, who is at home, was recently treated for bronchitis with antibiotics and is currently improving, though he still has a cough.  She is the primary caregiver for her 47 year old mother, who was exposed to her yesterday. Her mother has rheumatoid arthritis and is under the care of Dr. Garnette Ore in [place].    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   Knee pain 07/20/2020   Abnormal CBC 07/20/2020   Allergic reaction to insect bite 05/03/2020   Nephrolithiasis 12/05/2019   Pelvic pain 11/03/2019   LLQ pain 11/02/2019   Vaginal atrophy 11/02/2019   Routine general medical examination at a health care facility 05/20/2019   Advance care planning 05/20/2019   Back pain 05/20/2019   Diabetes mellitus without complication (HCC) 04/21/2018   Hypersomnolence 01/21/2018   Insomnia  07/20/2015   Varicose veins of both lower extremities with complications 04/07/2015   Chronic venous insufficiency 04/07/2015   History of deep vein thrombosis 04/07/2015   Bilateral carpal tunnel syndrome 03/11/2014   Numbness in both legs 03/11/2014   Hyperlipidemia    Hypothyroidism    Arthritis    Hypertension    GERD (gastroesophageal reflux disease)    Depression     Social History   Tobacco Use   Smoking status: Former    Current packs/day: 0.00    Average packs/day: 0.5 packs/day for 20.0 years (10.0 ttl pk-yrs)    Types: Cigarettes    Start date: 03/21/1991    Quit date: 03/21/2011    Years since quitting: 13.2   Smokeless tobacco: Never  Substance Use Topics   Alcohol use: Yes    Alcohol/week: 0.0 standard drinks of alcohol    Comment: one a month-socially     Current Outpatient Medications:    Accu-Chek FastClix Lancets MISC, Use to test blood sugar daily. DX: E11.9, Disp: 102 each, Rfl: 3   Blood Glucose Monitoring Suppl (ACCU-CHEK GUIDE) w/Device KIT, 1 kit by Subdermal route daily. Use to test blood sugar daily. DX: E11.9, Disp: 1 kit, Rfl: 0   Cholecalciferol (VITAMIN D3) 25 MCG (1000 UT) CAPS, Take 1 capsule (1,000 Units total) by mouth daily., Disp: , Rfl:    clobetasol  (OLUX ) 0.05 % topical foam, APPLY TOPICALLY TWICE A DAY, Disp: 50 g, Rfl: 6   Clobetasol  Propionate 0.05 % shampoo,  Apply 1 application  topically daily., Disp: , Rfl:    cyclobenzaprine  (FLEXERIL ) 10 MG tablet, Take 0.5-1 tablets by mouth at bedtime., Disp: , Rfl:    diclofenac (VOLTAREN) 50 MG EC tablet, Take 50 mg by mouth daily., Disp: , Rfl:    docusate sodium (COLACE) 100 MG capsule, Take 300 mg by mouth at bedtime., Disp: , Rfl:    esomeprazole  (NEXIUM ) 20 MG capsule, Take 1 capsule (20 mg total) by mouth daily as needed., Disp: , Rfl:    gabapentin (NEURONTIN) 300 MG capsule, Take 300 mg by mouth as needed., Disp: , Rfl:    glucose blood (ACCU-CHEK GUIDE) test strip, Use as instructed to  test blood sugar once daily and as needed.  Diagnosis:  E11.9  Non insulin  dependent., Disp: 100 each, Rfl: 3   levothyroxine  (SYNTHROID ) 125 MCG tablet, Take 1 tablet (125 mcg total) by mouth daily., Disp: 90 tablet, Rfl: 3   metFORMIN  (GLUCOPHAGE -XR) 500 MG 24 hr tablet, Take 3 tablets (1,500 mg total) by mouth daily with breakfast., Disp: 270 tablet, Rfl: 3   RESTASIS 0.05 % ophthalmic emulsion, , Disp: , Rfl:    rosuvastatin  (CRESTOR ) 10 MG tablet, Take 1 tablet (10 mg total) by mouth daily., Disp: 90 tablet, Rfl: 3   Semaglutide ,0.25 or 0.5MG /DOS, (OZEMPIC , 0.25 OR 0.5 MG/DOSE,) 2 MG/3ML SOPN, 0.25mg  injected weekly x4 weeks then 0.5mg  weekly after that if tolerated. Dx E11.9., Disp: 3 mL, Rfl: 2   temazepam  (RESTORIL ) 15 MG capsule, TAKE 1 TO 2 CAPSULES AT    BEDTIME AS NEEDED FOR SLEEP, Disp: 180 capsule, Rfl: 0  Allergies  Allergen Reactions   Celebrex [Celecoxib] Hives    ROS  Ten systems reviewed and is negative except as mentioned in HPI    Objective  Vitals:   06/26/24 1530  BP: 120/74  Pulse: 94  Resp: 18  Temp: 100 F (37.8 C)  TempSrc: Oral  SpO2: 98%  Weight: 197 lb (89.4 kg)  Height: 5' 3 (1.6 m)    Body mass index is 34.9 kg/m.    Physical Exam  CONSTITUTIONAL: Patient appears well-developed and well-nourished. No distress. HEENT: Head atraumatic, normocephalic, neck supple. Ears normal. CARDIOVASCULAR: Normal rate, regular rhythm and normal heart sounds. No murmur heard. No BLE edema. PULMONARY: Effort normal and breath sounds normal. No respiratory distress. Lungs clear to auscultation bilaterally. ABDOMINAL: There is no tenderness or distention. MUSCULOSKELETAL: Normal gait. Without gross motor or sensory deficit. PSYCHIATRIC: Patient has a normal mood and affect. Behavior is normal. Judgment and thought content normal.  Recent Results (from the past 2160 hours)  POC Covid19/Flu A&B Antigen     Status: Abnormal   Collection Time: 06/26/24  3:41  PM  Result Value Ref Range   Influenza A Antigen, POC Positive (A) Negative   Influenza B Antigen, POC Negative Negative   Covid Antigen, POC Negative Negative  POCT rapid strep A     Status: None   Collection Time: 06/26/24  3:41 PM  Result Value Ref Range   Rapid Strep A Screen Negative Negative      Assessment & Plan Influenza A infection with acute pharyngitis and fever Influenza A with ongoing symptoms. Possible secondary bacterial infection due to mucus changes. Outside ideal Tamiflu window but may still benefit. - Prescribed Tamiflu, one tablet twice daily for five days. - Advised hydration with fluids like Gatorade, coconut water, or water. - Recommended rest and sleep. - Suggested Mucinex  DM for cough management. - Instructed to  contact primary care if symptoms persist or worsen by Monday for secondary infection evaluation. - Sent Tamiflu prescription to pharmacy.

## 2024-07-08 ENCOUNTER — Telehealth: Payer: Self-pay

## 2024-07-08 DIAGNOSIS — E119 Type 2 diabetes mellitus without complications: Secondary | ICD-10-CM

## 2024-07-08 NOTE — Addendum Note (Signed)
 Addended by: CLEATUS ARLYSS RAMAN on: 07/08/2024 01:52 PM   Modules accepted: Orders

## 2024-07-08 NOTE — Telephone Encounter (Signed)
I put in the orders.  Thanks.

## 2024-07-08 NOTE — Telephone Encounter (Signed)
 Copied from CRM [id number]. Topic: Clinical - Request for Lab/Test Order >> Jul 08, 2024  9:39 AM Robinson DEL wrote: Reason for CRM: Patient wants physical labs done before appointment on 09/03/2024, no orders in system  [place] [phone]

## 2024-07-09 NOTE — Telephone Encounter (Signed)
 Called pt and schedule appt

## 2024-07-09 NOTE — Telephone Encounter (Signed)
 Noted.   Pls schedule fasting lab visit 1 wk prior to CPE.

## 2024-07-09 NOTE — Telephone Encounter (Signed)
 Note:

## 2024-07-20 ENCOUNTER — Encounter: Payer: Self-pay | Admitting: Family Medicine

## 2024-07-23 ENCOUNTER — Ambulatory Visit: Admitting: Sports Medicine

## 2024-07-23 LAB — OPHTHALMOLOGY REPORT-SCANNED

## 2024-08-10 ENCOUNTER — Telehealth: Payer: Self-pay

## 2024-08-10 NOTE — Telephone Encounter (Signed)
 I spoke with pt and she said except for head congestion pt is feeling a lot better and does not want an appt.pt will cb to [facility] if needed. Sending FYI to Dr Cleatus who is out of office and Dr KANDICE who is in office.

## 2024-08-10 NOTE — Telephone Encounter (Deleted)
 SABRA

## 2024-08-10 NOTE — Telephone Encounter (Signed)
 Noted. Thanks.

## 2024-08-10 NOTE — Telephone Encounter (Signed)
 SABRA

## 2024-08-10 NOTE — Telephone Encounter (Signed)
 In error

## 2024-08-12 ENCOUNTER — Ambulatory Visit: Admitting: Sports Medicine

## 2024-08-12 ENCOUNTER — Encounter: Payer: Self-pay | Admitting: Sports Medicine

## 2024-08-12 DIAGNOSIS — M722 Plantar fascial fibromatosis: Secondary | ICD-10-CM | POA: Diagnosis not present

## 2024-08-12 DIAGNOSIS — M2141 Flat foot [pes planus] (acquired), right foot: Secondary | ICD-10-CM | POA: Diagnosis not present

## 2024-08-12 DIAGNOSIS — M2142 Flat foot [pes planus] (acquired), left foot: Secondary | ICD-10-CM

## 2024-08-12 DIAGNOSIS — M7732 Calcaneal spur, left foot: Secondary | ICD-10-CM

## 2024-08-12 NOTE — Progress Notes (Signed)
 "  Christine Norman - 63 y.o. female MRN [id number]  Date of birth: 1960/08/31  Office Visit Note: Visit Date: 08/12/2024 PCP: Cleatus Arlyss RAMAN, MD Referred by: Cleatus Arlyss RAMAN, MD  Subjective: Chief Complaint  Patient presents with   Left Heel - Follow-up   HPI: Christine Norman is a pleasant 63 y.o. female who presents today for follow-up of acute on chronic left heel pain with plantar fascia heel pain.  Back earlier this summer she was having ongoing left heel pain which did improve somewhat after a few treatments of extracorporeal shockwave therapy, did have a brief shutdown in CAM Walker boot.  Back on 03/23/24, we did perform ultrasound-guided plantar fascia injection which gave her great relief initially, she really did not have any pain for about 3 weeks, however after this her pain slowly returned.  It is coming up on 1 year since her pain started.  She does feel that her left arch collapses slightly more than her right.  She does have over-the-counter padded orthotics [employer], she changes her shoes and has been rather consistent with her home exercises most of the time but pain still present.   Using oral diclofenac 50mg  every day.  Pertinent ROS were reviewed with the patient and found to be negative unless otherwise specified above in HPI.   Assessment & Plan: Visit Diagnoses:  1. Plantar fasciitis, left   2. Heel spur, left   3. Pes planus of both feet    Plan: Impression is acute on chronic left heel pain with recalcitrant plantar fascia heel pain.  She also has a calcaneal heel spur and a mild degree of longitudinal arch loss of the foot which is likely contributing.  She did have essentially 100% relief for the short-term after plantar fascial injection back in August but this only lasted for about 3 weeks or so before her pain slowly started returning.  It has been almost 1 year since the onset of her pain, given the recurrence, chronicity and degree of her pain, I feel it is pertinent to  evaluate the quality of the plantar fascia to rule out plantar fascia tearing, underlying bony edema or stress related injury as well as see what sort of signal change is present in her calcaneal heel spur to help guide treatment course.  She will continue in her orthotics, does have good cushion but not significant longitudinal arch support, could always transition to something more customized for her in the future.  She will continue her oral diclofenac 50 mg daily in the interim.  She will follow-up after MRI to review and we will discuss next steps depending on results.   Follow-up: Return for will f/u 1-week after MRI to review and discuss next steps.   Meds & Orders: No orders of the defined types were placed in this encounter.   Orders Placed This Encounter  Procedures   MR HEEL LEFT WO CONTRAST     Procedures: No procedures performed      Clinical History: No specialty comments available.  She reports that she quit smoking about 13 years ago. Her smoking use included cigarettes. She started smoking about 33 years ago. She has a 10 pack-year smoking history. She has never used smokeless tobacco. No results for input(s): HGBA1C, LABURIC in the last 8760 hours.  Objective:    Physical Exam  Gen: Well-appearing, in no acute distress; non-toxic CV: Well-perfused. Warm.  Resp: Breathing unlabored on room air; no wheezing. Psych: Fluid speech in conversation; appropriate  affect; normal thought process  Ortho Exam -Left foot: + TTP over the medial band of the insertional plantar fascia.  No redness swelling or effusion about the ankle joint.  Negative calcaneal heel squeeze.  There is a mild degree of loss of longitudinal arch bilaterally, left greater than right however.  Bunionette deformities bilaterally.  No excessive pronation/supination.  She has good plantarflexion and dorsiflexion about the Achilles without contracture.  Imaging: No results found.  Past  Medical/Family/Surgical/Social History: Medications & Allergies reviewed per EMR, new medications updated. 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   Knee pain 07/20/2020   Abnormal CBC 07/20/2020   Allergic reaction to insect bite 05/03/2020   Nephrolithiasis 12/05/2019   Pelvic pain 11/03/2019   LLQ pain 11/02/2019   Vaginal atrophy 11/02/2019   Routine general medical examination at a health care facility 05/20/2019   Advance care planning 05/20/2019   Back pain 05/20/2019   Diabetes mellitus without complication (HCC) 04/21/2018   Hypersomnolence 01/21/2018   Insomnia 07/20/2015   Varicose veins of both lower extremities with complications 04/07/2015   Chronic venous insufficiency 04/07/2015   History of deep vein thrombosis 04/07/2015   Bilateral carpal tunnel syndrome 03/11/2014   Numbness in both legs 03/11/2014   Hyperlipidemia    Hypothyroidism    Arthritis    Hypertension    GERD (gastroesophageal reflux disease)    Depression    Past Medical History:  Diagnosis Date   Allergy    seasonal   Arthritis    ankles and back, s/p epidural injections   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    Atypical mole 07/09/2012   Right Scalpula (excised)   Back pain    Clotting disorder    Constipation    chronic- takes OTC med that helps at times and others not    Depression    Diabetes mellitus without complication (HCC)    DVT (deep venous thrombosis), right 2000   rt knee-ankle   GERD (gastroesophageal reflux disease)    IBS   History of COVID-19    Hyperlipidemia    Hypertension     not medicated   Insomnia    Psoriasis    Thyroid  disease    hypothyroidism   Family History  Problem Relation Age of Onset   Atrial fibrillation Mother    Arthritis Mother    Diabetes Mother    Hearing loss Mother    Heart failure Father    Atrial fibrillation Father    Colon polyps Father    Heart disease Father    Heart disease Maternal Grandmother    Heart disease Maternal Grandfather    Colon cancer Neg Hx    Rectal cancer Neg Hx    Stomach cancer Neg Hx    Breast cancer Neg Hx    Past Surgical History:  Procedure Laterality Date   BREAST BIOPSY  01/29/2012   Procedure: BREAST BIOPSY WITH NEEDLE LOCALIZATION;  Surgeon: Sherlean JINNY Laughter, MD;  Location: [facility];  Service: General;  Laterality: Left;   CHOLECYSTECTOMY  2001   lap choli   COLONOSCOPY  2006   normal   FRACTURE SURGERY  2001   orif rt fif with knee surg   HAMMER TOE SURGERY  1997   rt and lt   HERNIA REPAIR  03   rt ing hernia   KNEE ARTHROSCOPY  2000   rt   TEAR DUCT PROBING Bilateral 2017   and plug placement to treat dry eye   Social History   Occupational History   Not on file  Tobacco Use   Smoking status: Former    Current packs/day: 0.00    Average packs/day: 0.5 packs/day for 20.0 years (10.0 ttl pk-yrs)    Types: Cigarettes    Start date: 03/21/1991    Quit date: 03/21/2011    Years since quitting: 13.4   Smokeless tobacco: Never  Vaping Use   Vaping status: Former   Devices: vaped for about 1 year  Substance and Sexual Activity   Alcohol use: Yes    Alcohol/week: 0.0 standard drinks of alcohol    Comment: one a month-socially   Drug use: No   Sexual activity: Never   "

## 2024-08-12 NOTE — Progress Notes (Signed)
 Patient says that she got about 3 weeks of relief from the injection that she had in August. She says that her pain is not as bad as it once was, but is in the same location and feels similar. She continues to do her exercises, and says that she does notice when she does not do them. She feels she walks differently on her left foot, and feels that her left arch falls more than the right arch. She rotates her shoes, and if she has to wear the same shoes two days in a row she rotates her insoles.

## 2024-08-14 ENCOUNTER — Ambulatory Visit: Admitting: Family

## 2024-08-14 ENCOUNTER — Encounter: Payer: Self-pay | Admitting: *Deleted

## 2024-08-14 ENCOUNTER — Encounter: Payer: Self-pay | Admitting: Family

## 2024-08-14 VITALS — BP 126/78 | HR 72 | Temp 98.1°F | Ht 63.0 in | Wt 201.0 lb

## 2024-08-14 DIAGNOSIS — J019 Acute sinusitis, unspecified: Secondary | ICD-10-CM | POA: Diagnosis not present

## 2024-08-14 DIAGNOSIS — B9689 Other specified bacterial agents as the cause of diseases classified elsewhere: Secondary | ICD-10-CM

## 2024-08-14 MED ORDER — AMOXICILLIN-POT CLAVULANATE 875-125 MG PO TABS: 1.0000 | ORAL_TABLET | Freq: Two times a day (BID) | ORAL | 0 refills | Status: AC

## 2024-08-14 MED ORDER — FLUTICASONE PROPIONATE 50 MCG/ACT NA SUSP: 2.0000 | Freq: Every day | NASAL | 6 refills | Status: AC

## 2024-08-14 NOTE — Progress Notes (Signed)
 "  Acute Office Visit  Subjective:     Patient ID: Christine Norman, female    DOB: 11-17-1960, 63 y.o.   MRN: [id number]  Chief Complaint  Patient presents with   Cough    C/o prod cough and nasal congestion  Sxs started about 1 wk ago. Seen [facility] on 08/07/24. Neg flu, strep and Covid tests. Tried Mucinex - helpful.     HPI Patient is in today with complaints of sneezing, cough, nasal congestion, sinus pressure x 1 week and worsening.  [facility] and was negative for strep, COVID, and flu.  She has been taking Mucinex  that has not helped much.  Denies any fever or chills.  Has a history of sinusitis.  Review of Systems  HENT:  Positive for congestion, sinus pain and sore throat.   Respiratory:  Positive for cough. Negative for shortness of breath and wheezing.   Cardiovascular:  Negative for chest pain.  All other systems reviewed and are negative.  Past Medical History:  Diagnosis Date   Allergy    seasonal   Arthritis    ankles and back, s/p epidural injections   Atypical ductal hyperplasia of breast 01/18/2012   Surgical Biopsy on 01/29/2012 showed a complex sclerosing lesion including sclerosing adenosis, stromal fibrosis, and cysts.    Atypical mole 07/09/2012   Right Scalpula (excised)   Back pain    Clotting disorder    Constipation    chronic- takes OTC med that helps at times and others not    Depression    Diabetes mellitus without complication (HCC)    DVT (deep venous thrombosis), right 2000   rt knee-ankle   GERD (gastroesophageal reflux disease)    IBS   History of COVID-19    Hyperlipidemia    Hypertension    not medicated   Insomnia    Psoriasis    Thyroid  disease    hypothyroidism    Social History   Socioeconomic History   Marital status: Married    Spouse name: Not on file   Number of children: Not on file   Years of education: Not on file   Highest education level: Not  on file  Occupational History   Not on file  Tobacco Use   Smoking status: Former    Current packs/day: 0.00    Average packs/day: 0.5 packs/day for 20.0 years (10.0 ttl pk-yrs)    Types: Cigarettes    Start date: 03/21/1991    Quit date: 03/21/2011    Years since quitting: 13.4   Smokeless tobacco: Never  Vaping Use   Vaping status: Former   Devices: vaped for about 1 year  Substance and Sexual Activity   Alcohol use: Yes    Alcohol/week: 0.0 standard drinks of alcohol    Comment: one a month-socially   Drug use: No   Sexual activity: Never  Other Topics Concern   Not on file  Social History Narrative   No kids   [employer] fan   Retired from [employer] 2019   Married 2017   Social Drivers of Health   Tobacco Use: Medium Risk (08/14/2024)   Patient History    Smoking Tobacco Use: Former    Smokeless Tobacco Use: Never    Passive Exposure: Not on Actuary Strain: Not on file  Food Insecurity: Not on file  Transportation Needs: Not on file  Physical Activity: Not on file  Stress: Not on file  Social Connections: Not on file  Intimate Partner Violence: Not on file  Depression [phone]): Low Risk (06/26/2024)   Depression (PHQ2-9)    PHQ-2 Score: 0  Alcohol Screen: Not on file  Housing: Not on file  Utilities: Not on file  Health Literacy: Not on file    Past Surgical History:  Procedure Laterality Date   BREAST BIOPSY  01/29/2012   Procedure: BREAST BIOPSY WITH NEEDLE LOCALIZATION;  Surgeon: Sherlean JINNY Laughter, MD;  Location: [facility];  Service: General;  Laterality: Left;   CHOLECYSTECTOMY  2001   lap choli   COLONOSCOPY  2006   normal   FRACTURE SURGERY  2001   orif rt fif with knee surg   HAMMER TOE SURGERY  1997   rt and lt   HERNIA REPAIR  03   rt ing hernia   KNEE ARTHROSCOPY  2000   rt   TEAR DUCT PROBING Bilateral 2017   and plug placement to treat dry eye    Family History  Problem Relation Age of Onset    Atrial fibrillation Mother    Arthritis Mother    Diabetes Mother    Hearing loss Mother    Heart failure Father    Atrial fibrillation Father    Colon polyps Father    Heart disease Father    Heart disease Maternal Grandmother    Heart disease Maternal Grandfather    Colon cancer Neg Hx    Rectal cancer Neg Hx    Stomach cancer Neg Hx    Breast cancer Neg Hx     Allergies[1]  Medications Ordered Prior to Encounter[2]  BP 126/78   Pulse 72   Temp 98.1 F (36.7 C) (Oral)   Ht 5' 3 (1.6 m)   Wt 201 lb (91.2 kg)   SpO2 98%   BMI 35.61 kg/m chart      Objective:    BP 126/78   Pulse 72   Temp 98.1 F (36.7 C) (Oral)   Ht 5' 3 (1.6 m)   Wt 201 lb (91.2 kg)   SpO2 98%   BMI 35.61 kg/m    Physical Exam Vitals and nursing note reviewed.  Constitutional:      Appearance: Normal appearance. She is normal weight.  HENT:     Right Ear: Tympanic membrane, ear canal and external ear normal.     Left Ear: Tympanic membrane, ear canal and external ear normal.     Nose: Congestion present.     Mouth/Throat:     Mouth: Mucous membranes are moist.     Pharynx: Oropharynx is clear. Posterior oropharyngeal erythema present. No oropharyngeal exudate.  Cardiovascular:     Rate and Rhythm: Normal rate and regular rhythm.     Pulses: Normal pulses.     Heart sounds: Normal heart sounds.  Musculoskeletal:        General: Normal range of motion.     Cervical back: Normal range of motion and neck supple.  Skin:    General: Skin is warm and dry.  Neurological:     General: No focal deficit present.     Mental Status: She is alert and oriented to person, place, and time. Mental status is at baseline.  Psychiatric:        Mood and Affect: Mood normal.        Behavior: Behavior normal.        Thought Content: Thought content normal.        Judgment:  Judgment normal.    No results found for any visits on 08/14/24.      Assessment & Plan:   Problem  List Items Addressed This Visit   None Visit Diagnoses       Acute bacterial sinusitis    -  Primary   Relevant Medications   amoxicillin -clavulanate (AUGMENTIN ) 875-125 MG tablet   fluticasone  (FLONASE ) 50 MCG/ACT nasal spray       Meds ordered this encounter  Medications   amoxicillin -clavulanate (AUGMENTIN ) 875-125 MG tablet    Sig: Take 1 tablet by mouth 2 (two) times daily.    Dispense:  20 tablet    Refill:  0   fluticasone  (FLONASE ) 50 MCG/ACT nasal spray    Sig: Place 2 sprays into both nostrils daily.    Dispense:  16 g    Refill:  6   Call the office if symptoms worsen or persist.  Recheck as scheduled and sooner as needed. No follow-ups on file.  Elyon Zoll B Charlette Hennings, FNP       [1] Allergies Allergen Reactions   Celebrex [Celecoxib] Hives  [2] Current Outpatient Medications on File Prior to Visit  Medication Sig Dispense Refill   Accu-Chek FastClix Lancets MISC Use to test blood sugar daily. DX: E11.9 102 each 3   Blood Glucose Monitoring Suppl (ACCU-CHEK GUIDE) w/Device KIT 1 kit by Subdermal route daily. Use to test blood sugar daily. DX: E11.9 1 kit 0   Cholecalciferol (VITAMIN D3) 25 MCG (1000 UT) CAPS Take 1 capsule (1,000 Units total) by mouth daily.     clobetasol  (OLUX ) 0.05 % topical foam APPLY TOPICALLY TWICE A DAY 50 g 6   Clobetasol  Propionate 0.05 % shampoo Apply 1 application  topically daily.     cyclobenzaprine  (FLEXERIL ) 10 MG tablet Take 0.5-1 tablets by mouth at bedtime.     diclofenac (VOLTAREN) 50 MG EC tablet Take 50 mg by mouth daily.     docusate sodium (COLACE) 100 MG capsule Take 300 mg by mouth at bedtime.     esomeprazole  (NEXIUM ) 20 MG capsule Take 1 capsule (20 mg total) by mouth daily as needed.     gabapentin (NEURONTIN) 300 MG capsule Take 300 mg by mouth as needed.     glucose blood (ACCU-CHEK GUIDE) test strip Use as instructed to test blood sugar once daily and as needed.  Diagnosis:  E11.9  Non insulin  dependent.  100 each 3   levothyroxine  (SYNTHROID ) 125 MCG tablet Take 1 tablet (125 mcg total) by mouth daily. 90 tablet 3   metFORMIN  (GLUCOPHAGE -XR) 500 MG 24 hr tablet Take 3 tablets (1,500 mg total) by mouth daily with breakfast. 270 tablet 3   RESTASIS 0.05 % ophthalmic emulsion      rosuvastatin  (CRESTOR ) 10 MG tablet Take 1 tablet (10 mg total) by mouth daily. 90 tablet 3   temazepam  (RESTORIL ) 15 MG capsule TAKE 1 TO 2 CAPSULES AT    BEDTIME AS NEEDED FOR SLEEP 180 capsule 0   Semaglutide ,0.25 or 0.5MG /DOS, (OZEMPIC , 0.25 OR 0.5 MG/DOSE,) 2 MG/3ML SOPN 0.25mg  injected weekly x4 weeks then 0.5mg  weekly after that if tolerated. Dx E11.9. (Patient not taking: Reported on 08/14/2024) 3 mL 2   No current facility-administered medications on file prior to visit.  "

## 2024-08-18 ENCOUNTER — Telehealth: Payer: Self-pay | Admitting: Sports Medicine

## 2024-08-18 NOTE — Telephone Encounter (Signed)
"  Faxed  "

## 2024-08-18 NOTE — Telephone Encounter (Signed)
 Damien from [facility] called requesting orders and order details be faxed for MRI to fax number [phone]. [facility] number is [phone]

## 2024-08-27 ENCOUNTER — Other Ambulatory Visit

## 2024-08-31 ENCOUNTER — Other Ambulatory Visit

## 2024-09-03 ENCOUNTER — Encounter: Admitting: Family Medicine

## 2024-09-07 ENCOUNTER — Encounter: Admitting: Family Medicine

## 2024-09-18 ENCOUNTER — Ambulatory Visit: Admitting: Sports Medicine

## 2024-09-18 DIAGNOSIS — M7732 Calcaneal spur, left foot: Secondary | ICD-10-CM

## 2024-09-18 DIAGNOSIS — M722 Plantar fascial fibromatosis: Secondary | ICD-10-CM

## 2024-09-18 DIAGNOSIS — M2142 Flat foot [pes planus] (acquired), left foot: Secondary | ICD-10-CM

## 2024-09-18 DIAGNOSIS — M629 Disorder of muscle, unspecified: Secondary | ICD-10-CM

## 2024-09-18 DIAGNOSIS — M2141 Flat foot [pes planus] (acquired), right foot: Secondary | ICD-10-CM

## 2024-09-18 NOTE — Progress Notes (Unsigned)
 "  Christine Norman - 64 y.o. female MRN [id number]  Date of birth: 1961-01-15  Office Visit Note: Visit Date: 09/18/2024 PCP: Cleatus Arlyss RAMAN, MD Referred by: Cleatus Arlyss RAMAN, MD  Subjective: Chief Complaint  Patient presents with   Left Heel - Follow-up   HPI: Christine Norman is a pleasant 64 y.o. female who presents today for follow-up of chronic left heel pain.  Discussed the use of AI scribe software for clinical note transcription with the patient, who gave verbal consent to proceed.  History of Present Illness     Pertinent ROS were reviewed with the patient and found to be negative unless otherwise specified above in HPI.   Assessment & Plan: Visit Diagnoses: No diagnosis found.  Assessment and Plan Assessment & Plan      *Additional considerations: ***  Follow-up: No follow-ups on file.   Meds & Orders: No orders of the defined types were placed in this encounter.  No orders of the defined types were placed in this encounter.    Procedures: No procedures performed      Clinical History: No specialty comments available.  She reports that she quit smoking about 13 years ago. Her smoking use included cigarettes. She started smoking about 33 years ago. She has a 10 pack-year smoking history. She has never used smokeless tobacco. No results for input(s): HGBA1C, LABURIC in the last 8760 hours.  Objective:   Vital Signs: There were no vitals taken for this visit.  Physical Exam  Gen: Well-appearing, in no acute distress; non-toxic CV: Well-perfused. Warm.  Resp: Breathing unlabored on room air; no wheezing. Psych: Fluid speech in conversation; appropriate affect; normal thought process  *MSK/Ortho Exam: Physical Exam   Imaging: No results found.  Past Medical/Family/Surgical/Social History: Medications & Allergies reviewed per EMR, new medications updated. 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   Knee pain 07/20/2020   Abnormal CBC 07/20/2020   Allergic reaction to insect bite 05/03/2020   Nephrolithiasis 12/05/2019   Pelvic pain 11/03/2019   LLQ pain 11/02/2019   Vaginal atrophy 11/02/2019   Routine general medical examination at a health care facility 05/20/2019   Advance care planning 05/20/2019   Back pain 05/20/2019   Diabetes mellitus without complication (HCC) 04/21/2018   Hypersomnolence 01/21/2018   Insomnia 07/20/2015   Varicose veins of both lower extremities with complications 04/07/2015   Chronic venous insufficiency 04/07/2015   History of deep vein thrombosis 04/07/2015   Bilateral carpal tunnel syndrome 03/11/2014   Numbness in both legs 03/11/2014   Hyperlipidemia    Hypothyroidism    Arthritis    Hypertension    GERD (gastroesophageal reflux disease)    Depression    Past Medical History:  Diagnosis Date   Allergy    seasonal   Arthritis    ankles and back, s/p epidural injections   Atypical ductal hyperplasia of breast 01/18/2012   Surgical Biopsy on 01/29/2012 showed a complex sclerosing lesion including sclerosing adenosis, stromal fibrosis, and cysts.    Atypical mole 07/09/2012   Right Scalpula (excised)   Back pain    Clotting disorder    Constipation    chronic- takes OTC med that helps at times and others not    Depression    Diabetes mellitus without  complication (HCC)    DVT (deep venous thrombosis), right 2000   rt knee-ankle   GERD (gastroesophageal reflux disease)    IBS   History of COVID-19    Hyperlipidemia    Hypertension    not medicated   Insomnia    Psoriasis    Thyroid  disease    hypothyroidism   Family History  Problem Relation Age of Onset   Atrial fibrillation Mother    Arthritis Mother    Diabetes Mother    Hearing  loss Mother    Heart failure Father    Atrial fibrillation Father    Colon polyps Father    Heart disease Father    Heart disease Maternal Grandmother    Heart disease Maternal Grandfather    Colon cancer Neg Hx    Rectal cancer Neg Hx    Stomach cancer Neg Hx    Breast cancer Neg Hx    Past Surgical History:  Procedure Laterality Date   BREAST BIOPSY  01/29/2012   Procedure: BREAST BIOPSY WITH NEEDLE LOCALIZATION;  Surgeon: Sherlean JINNY Laughter, MD;  Location: [facility];  Service: General;  Laterality: Left;   CHOLECYSTECTOMY  2001   lap choli   COLONOSCOPY  2006   normal   FRACTURE SURGERY  2001   orif rt fif with knee surg   HAMMER TOE SURGERY  1997   rt and lt   HERNIA REPAIR  03   rt ing hernia   KNEE ARTHROSCOPY  2000   rt   TEAR DUCT PROBING Bilateral 2017   and plug placement to treat dry eye   Social History   Occupational History   Not on file  Tobacco Use   Smoking status: Former    Current packs/day: 0.00    Average packs/day: 0.5 packs/day for 20.0 years (10.0 ttl pk-yrs)    Types: Cigarettes    Start date: 03/21/1991    Quit date: 03/21/2011    Years since quitting: 13.5   Smokeless tobacco: Never  Vaping Use   Vaping status: Former   Devices: vaped for about 1 year  Substance and Sexual Activity   Alcohol use: Yes    Alcohol/week: 0.0 standard drinks of alcohol    Comment: one a month-socially   Drug use: No   Sexual activity: Never   "

## 2024-09-19 ENCOUNTER — Encounter: Payer: Self-pay | Admitting: Sports Medicine

## 2024-09-24 ENCOUNTER — Ambulatory Visit: Admitting: Orthopedic Surgery

## 2024-09-24 DIAGNOSIS — M6702 Short Achilles tendon (acquired), left ankle: Secondary | ICD-10-CM

## 2024-09-24 DIAGNOSIS — M722 Plantar fascial fibromatosis: Secondary | ICD-10-CM

## 2024-09-25 ENCOUNTER — Encounter: Payer: Self-pay | Admitting: Orthopedic Surgery

## 2024-09-25 NOTE — Progress Notes (Signed)
 "  Office Visit Note   Patient: Christine Norman           Date of Birth: 09/07/1960           MRN: [id number] Visit Date: 09/24/2024              Requested by: Cleatus Arlyss RAMAN, MD [address] [place],  [place] [address] PCP: Cleatus Arlyss RAMAN, MD  Chief Complaint  Patient presents with   Left Foot - Pain      HPI: Discussed the use of AI scribe software for clinical note transcription with the patient, who gave verbal consent to proceed.  History of Present Illness [name] is a 64 year old female with chronic left plantar fasciitis and Achilles tendinopathy who presents for evaluation of persistent left heel pain.  She has experienced left heel pain for approximately one year, with symptoms localized to the heel and intermittent radiation to the posterior heel and plantar aspect of the ankle. Altered gait due to pain occasionally results in discomfort along the lateral foot.  She previously completed five sessions of shockwave therapy to the plantar heel and received injection therapy during a prior episode, which was effective at that time. Her current symptoms have been refractory to these interventions. She has not participated in formal physical therapy but performs home exercises, including wall stretches, towel scrunches, and instep massage.  Imaging reviewed prior to the visit includes MRI demonstrating mild inflammation between the Achilles insertion and a small Haglund's deformity, and radiographs showing a small calcaneal spur and Haglund's deformity. She has not undergone surgical intervention and is not currently using medications for heel pain.     Assessment & Plan: Visit Diagnoses: No diagnosis found.  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 Mild inflammation and edema present  without tendon or fascial tear. Prior treatments ineffective. Surgery not recommended due to risks and lack of benefit. PRP injection not advised due to insufficient evidence and insurance issues. Conservative management with strengthening exercises recommended. - Reviewed imaging findings and reassured her regarding the integrity of the Achilles tendon and plantar fascia. - Recommended barefoot toe raises and isometric heel raises throughout the day. - Advised to add weight to isometric heel raises once comfortable. - Advised to continue home exercise program and conservative management. - Advised against surgical intervention and PRP injection. - Recommended follow-up as needed.      Follow-Up Instructions: No follow-ups on file.   Ortho Exam  Patient is alert, oriented, no adenopathy, well-dressed, normal affect, normal respiratory effort. Physical Exam CARDIOVASCULAR: Dorsalis pedis pulse present. MUSCULOSKELETAL: Dorsiflexion 20 degrees past neutral, no Achilles rupture. Tenderness over plantar fascia origin and Achilles insertion.      Imaging: No results found. No images are attached to the encounter.  Labs: Lab Results  Component Value Date   HGBA1C 7.5 (H) 08/12/2023   HGBA1C 8.7 (H) 08/02/2022   HGBA1C 6.6 (H) 11/06/2021   ESRSEDRATE 4 02/24/2014   LABORGA  11/21/2016    Multiple organisms present,each less than 10,000 CFU/mL. These organisms,commonly found on external and internal genitalia,are considered colonizers. No further testing performed.      Lab Results  Component Value Date   ALBUMIN 3.8 11/26/2019   ALBUMIN 2.6 (L) 11/26/2019   ALBUMIN 4.3 11/02/2015    Lab Results  Component Value Date   MG  2.3 06/14/2014   MG 2.0 02/24/2014   Lab Results  Component Value Date   VD25OH 34 06/14/2014   VD25OH 40 02/24/2014   VD25OH 39 08/07/2013    No results found for: PREALBUMIN    Latest Ref Rng & Units 08/12/2023    8:36 AM 08/02/2022     9:29 AM 11/16/2021   11:32 AM  CBC EXTENDED  WBC 3.8 - 10.8 Thousand/uL 5.8  7.5  5.7   RBC 3.80 - 5.10 Million/uL 4.33  4.17  4.34   Hemoglobin 11.7 - 15.5 g/dL 87.9  88.0  88.0   HCT 35.0 - 45.0 % 37.4  36.2  37.6   Platelets 140 - 400 Thousand/uL 133  CANCELED  140   NEUT# 1,500 - 7,800 cells/uL 3,950  5,678  4,087   Lymph# 850 - 3,900 cells/uL  1,103  1,083      There is no height or weight on file to calculate BMI.  Orders:  No orders of the defined types were placed in this encounter.  No orders of the defined types were placed in this encounter.    Procedures: No procedures performed  Clinical Data: No additional findings.  ROS:  All other systems negative, except as noted in the HPI. Review of Systems  Objective: Vital Signs: There were no vitals taken for this visit.  Specialty Comments:  No specialty comments available.  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   Knee pain 07/20/2020   Abnormal CBC 07/20/2020   Allergic reaction to insect bite 05/03/2020   Nephrolithiasis 12/05/2019   Pelvic pain 11/03/2019   LLQ pain 11/02/2019   Vaginal atrophy 11/02/2019   Routine general medical examination at a health care facility 05/20/2019   Advance care planning 05/20/2019   Back pain 05/20/2019   Diabetes mellitus without complication (HCC) 04/21/2018   Hypersomnolence 01/21/2018   Insomnia 07/20/2015   Varicose veins of both lower extremities with complications 04/07/2015   Chronic venous insufficiency 04/07/2015   History of deep vein thrombosis 04/07/2015   Bilateral carpal tunnel syndrome 03/11/2014   Numbness in both legs 03/11/2014   Hyperlipidemia    Hypothyroidism    Arthritis     Hypertension    GERD (gastroesophageal reflux disease)    Depression    Past Medical History:  Diagnosis Date   Allergy    seasonal   Arthritis    ankles and back, s/p epidural injections   Atypical ductal hyperplasia of breast 01/18/2012   Surgical Biopsy on 01/29/2012 showed a complex sclerosing lesion including sclerosing adenosis, stromal fibrosis, and cysts.    Atypical mole 07/09/2012   Right Scalpula (excised)   Back pain    Clotting disorder    Constipation    chronic- takes OTC med that helps at times and others not    Depression    Diabetes mellitus without complication (HCC)    DVT (deep venous thrombosis), right 2000   rt knee-ankle   GERD (gastroesophageal reflux disease)    IBS   History of COVID-19    Hyperlipidemia    Hypertension    not medicated   Insomnia    Psoriasis    Thyroid  disease    hypothyroidism    Family History  Problem Relation Age  of Onset   Atrial fibrillation Mother    Arthritis Mother    Diabetes Mother    Hearing loss Mother    Heart failure Father    Atrial fibrillation Father    Colon polyps Father    Heart disease Father    Heart disease Maternal Grandmother    Heart disease Maternal Grandfather    Colon cancer Neg Hx    Rectal cancer Neg Hx    Stomach cancer Neg Hx    Breast cancer Neg Hx     Past Surgical History:  Procedure Laterality Date   BREAST BIOPSY  01/29/2012   Procedure: BREAST BIOPSY WITH NEEDLE LOCALIZATION;  Surgeon: Sherlean JINNY Laughter, MD;  Location: [facility];  Service: General;  Laterality: Left;   CHOLECYSTECTOMY  2001   lap choli   COLONOSCOPY  2006   normal   FRACTURE SURGERY  2001   orif rt fif with knee surg   HAMMER TOE SURGERY  1997   rt and lt   HERNIA REPAIR  03   rt ing hernia   KNEE ARTHROSCOPY  2000   rt   TEAR DUCT PROBING Bilateral 2017   and plug placement to treat dry eye   Social History   Occupational History   Not on file  Tobacco Use   Smoking status:  Former    Current packs/day: 0.00    Average packs/day: 0.5 packs/day for 20.0 years (10.0 ttl pk-yrs)    Types: Cigarettes    Start date: 03/21/1991    Quit date: 03/21/2011    Years since quitting: 13.5   Smokeless tobacco: Never  Vaping Use   Vaping status: Former   Devices: vaped for about 1 year  Substance and Sexual Activity   Alcohol use: Yes    Alcohol/week: 0.0 standard drinks of alcohol    Comment: one a month-socially   Drug use: No   Sexual activity: Never         "

## 2024-10-05 ENCOUNTER — Other Ambulatory Visit

## 2024-10-12 ENCOUNTER — Encounter: Admitting: Family Medicine
